# Patient Record
Sex: Female | Born: 1955 | Race: White | Hispanic: No | Marital: Married | Smoking: Never smoker
Health system: Southern US, Community
[De-identification: ages and names within clinical notes are randomized; demographics above are authoritative.]

## PROBLEM LIST (undated history)

## (undated) DIAGNOSIS — N390 Urinary tract infection, site not specified: Secondary | ICD-10-CM

## (undated) DIAGNOSIS — C801 Malignant (primary) neoplasm, unspecified: Secondary | ICD-10-CM

## (undated) DIAGNOSIS — C569 Malignant neoplasm of unspecified ovary: Secondary | ICD-10-CM

## (undated) DIAGNOSIS — R569 Unspecified convulsions: Secondary | ICD-10-CM

## (undated) HISTORY — PX: NO PAST SURGERIES: SHX2092

---

## 2017-08-26 ENCOUNTER — Other Ambulatory Visit: Payer: Self-pay | Admitting: Physician Assistant

## 2017-08-26 ENCOUNTER — Ambulatory Visit
Admission: RE | Admit: 2017-08-26 | Discharge: 2017-08-26 | Disposition: A | Discharge status: Home / Self Care | Payer: BLUE CROSS/BLUE SHIELD | Source: Ambulatory Visit | Attending: Physician Assistant | Admitting: Physician Assistant

## 2017-08-26 DIAGNOSIS — R112 Nausea with vomiting, unspecified: Secondary | ICD-10-CM

## 2017-08-26 DIAGNOSIS — R197 Diarrhea, unspecified: Secondary | ICD-10-CM

## 2017-08-30 ENCOUNTER — Ambulatory Visit
Admission: RE | Admit: 2017-08-30 | Discharge: 2017-08-30 | Disposition: A | Discharge status: Home / Self Care | Payer: BLUE CROSS/BLUE SHIELD | Source: Ambulatory Visit | Attending: Physician Assistant | Admitting: Physician Assistant

## 2017-08-30 ENCOUNTER — Other Ambulatory Visit: Payer: Self-pay | Admitting: Physician Assistant

## 2017-08-30 DIAGNOSIS — J984 Other disorders of lung: Secondary | ICD-10-CM

## 2017-09-03 ENCOUNTER — Observation Stay (HOSPITAL_COMMUNITY)
Admission: EM | Admit: 2017-09-03 | Discharge: 2017-09-05 | Disposition: A | Discharge status: Home / Self Care | Payer: BLUE CROSS/BLUE SHIELD | Attending: Internal Medicine | Admitting: Internal Medicine

## 2017-09-03 ENCOUNTER — Emergency Department (HOSPITAL_COMMUNITY): Payer: BLUE CROSS/BLUE SHIELD

## 2017-09-03 ENCOUNTER — Other Ambulatory Visit: Payer: Self-pay

## 2017-09-03 ENCOUNTER — Encounter (HOSPITAL_COMMUNITY): Payer: Self-pay | Admitting: Emergency Medicine

## 2017-09-03 ENCOUNTER — Observation Stay (HOSPITAL_COMMUNITY): Payer: BLUE CROSS/BLUE SHIELD

## 2017-09-03 DIAGNOSIS — C800 Disseminated malignant neoplasm, unspecified: Secondary | ICD-10-CM | POA: Insufficient documentation

## 2017-09-03 DIAGNOSIS — R748 Abnormal levels of other serum enzymes: Secondary | ICD-10-CM | POA: Diagnosis not present

## 2017-09-03 DIAGNOSIS — R197 Diarrhea, unspecified: Secondary | ICD-10-CM | POA: Insufficient documentation

## 2017-09-03 DIAGNOSIS — R112 Nausea with vomiting, unspecified: Secondary | ICD-10-CM | POA: Diagnosis not present

## 2017-09-03 DIAGNOSIS — J90 Pleural effusion, not elsewhere classified: Secondary | ICD-10-CM | POA: Diagnosis not present

## 2017-09-03 DIAGNOSIS — N838 Other noninflammatory disorders of ovary, fallopian tube and broad ligament: Secondary | ICD-10-CM | POA: Diagnosis present

## 2017-09-03 DIAGNOSIS — K859 Acute pancreatitis without necrosis or infection, unspecified: Secondary | ICD-10-CM

## 2017-09-03 DIAGNOSIS — Z79899 Other long term (current) drug therapy: Secondary | ICD-10-CM | POA: Diagnosis not present

## 2017-09-03 DIAGNOSIS — N1330 Unspecified hydronephrosis: Secondary | ICD-10-CM | POA: Insufficient documentation

## 2017-09-03 DIAGNOSIS — N135 Crossing vessel and stricture of ureter without hydronephrosis: Secondary | ICD-10-CM | POA: Diagnosis not present

## 2017-09-03 DIAGNOSIS — R188 Other ascites: Principal | ICD-10-CM | POA: Diagnosis present

## 2017-09-03 DIAGNOSIS — R1013 Epigastric pain: Secondary | ICD-10-CM | POA: Diagnosis not present

## 2017-09-03 DIAGNOSIS — G40909 Epilepsy, unspecified, not intractable, without status epilepticus: Secondary | ICD-10-CM | POA: Diagnosis not present

## 2017-09-03 DIAGNOSIS — N3001 Acute cystitis with hematuria: Secondary | ICD-10-CM

## 2017-09-03 HISTORY — DX: Unspecified convulsions: R56.9

## 2017-09-03 HISTORY — DX: Urinary tract infection, site not specified: N39.0

## 2017-09-03 LAB — CBC
HCT: 42.9 % (ref 36.0–46.0)
HEMOGLOBIN: 14.7 g/dL (ref 12.0–15.0)
MCH: 29.5 pg (ref 26.0–34.0)
MCHC: 34.3 g/dL (ref 30.0–36.0)
MCV: 86 fL (ref 78.0–100.0)
PLATELETS: 468 10*3/uL — AB (ref 150–400)
RBC: 4.99 MIL/uL (ref 3.87–5.11)
RDW: 13.4 % (ref 11.5–15.5)
WBC: 12.5 10*3/uL — AB (ref 4.0–10.5)

## 2017-09-03 LAB — COMPREHENSIVE METABOLIC PANEL
ALK PHOS: 124 U/L (ref 38–126)
ALT: 38 U/L (ref 14–54)
ANION GAP: 13 (ref 5–15)
AST: 35 U/L (ref 15–41)
Albumin: 3.6 g/dL (ref 3.5–5.0)
BILIRUBIN TOTAL: 0.7 mg/dL (ref 0.3–1.2)
BUN: 7 mg/dL (ref 6–20)
CALCIUM: 8.7 mg/dL — AB (ref 8.9–10.3)
CO2: 22 mmol/L (ref 22–32)
CREATININE: 0.87 mg/dL (ref 0.44–1.00)
Chloride: 98 mmol/L — ABNORMAL LOW (ref 101–111)
Glucose, Bld: 124 mg/dL — ABNORMAL HIGH (ref 65–99)
Potassium: 3.6 mmol/L (ref 3.5–5.1)
SODIUM: 133 mmol/L — AB (ref 135–145)
TOTAL PROTEIN: 7.4 g/dL (ref 6.5–8.1)

## 2017-09-03 LAB — URINALYSIS, ROUTINE W REFLEX MICROSCOPIC
BILIRUBIN URINE: NEGATIVE
GLUCOSE, UA: NEGATIVE mg/dL
Ketones, ur: 5 mg/dL — AB
NITRITE: NEGATIVE
PH: 5 (ref 5.0–8.0)
Protein, ur: NEGATIVE mg/dL
SPECIFIC GRAVITY, URINE: 1.013 (ref 1.005–1.030)

## 2017-09-03 LAB — I-STAT TROPONIN, ED: TROPONIN I, POC: 0 ng/mL (ref 0.00–0.08)

## 2017-09-03 LAB — PHENYTOIN LEVEL, TOTAL

## 2017-09-03 LAB — LIPASE, BLOOD: Lipase: 54 U/L — ABNORMAL HIGH (ref 11–51)

## 2017-09-03 LAB — I-STAT CG4 LACTIC ACID, ED: Lactic Acid, Venous: 1.18 mmol/L (ref 0.5–1.9)

## 2017-09-03 MED ORDER — ONDANSETRON HCL 4 MG/2ML IJ SOLN
4.0000 mg | Freq: Four times a day (QID) | INTRAMUSCULAR | Status: DC | PRN
  Administered 2017-09-03 – 2017-09-04 (×2): 4 mg via INTRAVENOUS
  Filled 2017-09-03 (×2): qty 2

## 2017-09-03 MED ORDER — IOPAMIDOL (ISOVUE-300) INJECTION 61%
INTRAVENOUS | Status: AC
  Administered 2017-09-03: 100 mL via INTRAVENOUS
  Filled 2017-09-03: qty 100

## 2017-09-03 MED ORDER — MORPHINE SULFATE (PF) 4 MG/ML IV SOLN
4.0000 mg | Freq: Once | INTRAVENOUS | Status: DC
  Filled 2017-09-03: qty 1

## 2017-09-03 MED ORDER — SODIUM CHLORIDE 0.9 % IV BOLUS (SEPSIS)
1000.0000 mL | Freq: Once | INTRAVENOUS | Status: AC
  Administered 2017-09-03: 1000 mL via INTRAVENOUS

## 2017-09-03 MED ORDER — PHENYTOIN SODIUM EXTENDED 100 MG PO CAPS: 100.0000 mg | ORAL_CAPSULE | Freq: Every day | ORAL | Status: DC

## 2017-09-03 MED ORDER — PHENYTOIN 50 MG PO CHEW
100.0000 mg | CHEWABLE_TABLET | Freq: Every day | ORAL | Status: DC
  Filled 2017-09-03: qty 2

## 2017-09-03 MED ORDER — ONDANSETRON HCL 4 MG PO TABS: 4.0000 mg | ORAL_TABLET | Freq: Four times a day (QID) | ORAL | Status: DC | PRN

## 2017-09-03 MED ORDER — ENOXAPARIN SODIUM 40 MG/0.4ML ~~LOC~~ SOLN
40.0000 mg | Freq: Every day | SUBCUTANEOUS | Status: DC
  Administered 2017-09-03 – 2017-09-04 (×2): 40 mg via SUBCUTANEOUS
  Filled 2017-09-03 (×2): qty 0.4

## 2017-09-03 MED ORDER — ONDANSETRON HCL 4 MG/2ML IJ SOLN
4.0000 mg | Freq: Once | INTRAMUSCULAR | Status: AC
  Administered 2017-09-03: 4 mg via INTRAVENOUS
  Filled 2017-09-03 (×2): qty 2

## 2017-09-03 MED ORDER — PHENYTOIN SODIUM EXTENDED 100 MG PO CAPS
100.0000 mg | ORAL_CAPSULE | Freq: Every day | ORAL | Status: DC
  Administered 2017-09-03 – 2017-09-04 (×2): 100 mg via ORAL
  Filled 2017-09-03 (×2): qty 1

## 2017-09-03 NOTE — ED Notes (Signed)
Bed assigned @ 2034 rm [id number]

## 2017-09-03 NOTE — ED Triage Notes (Signed)
Pt verbalizes recent UTI with associated nausea "that got better." Pt then verbalizes awoke with emesis this morning and noted blood with wiping post void.

## 2017-09-03 NOTE — ED Notes (Signed)
Bed: [facility] Expected date:  Expected time:  Means of arrival:  Comments: 

## 2017-09-03 NOTE — H&P (Addendum)
History and Physical    Christie Maynard:[id number] DOB: Jan 17, 1956 DOA: 09/03/2017  Referring MD/NP/PA: EDP PCP:  Patient coming from: Home  Chief Complaint: abd bloating  HPI: Christie Maynard is a 61 y.o. female with medical history significant for remote history of seizures in 1982 on 100 mg daily of Dilantin, no other chronic medical problems presents to the emergency room with multiple nonspecific abdominal symptoms for 7-10 days patient reports being in her usual state of health until Thanksgiving time after which she's had nonspecific abdominal discomfort, bloating, 1 or 2 episodes of vomiting and some nausea, She saw her primary care doctor last Thursday who did some urine and stool tests, it is felt that she had a urinary infection and she was started on ciprofloxacin then, she started feeling a little better on antibiotics however in the last few days noticed increased abdominal bloating and nausea, this morning she had sudden onset of nausea and vomiting and she also noticed some pinkish stain on her toilet paper after urinating, although this caused her to be concerned and she presented to the emergency room.  ED Course:  Workup noted mildly elevated white blood cell count and platelets, lipase 54, LFTs and albumin within normal limits, bilirubin 0.7 , she also had a right upper quadrant ultrasound which noted some sludge in the gallbladder with no evidence of gallbladder wall thickening or biliary biliary dilation, in addition it reported right pleural effusion and moderate ascites and slight nodularity in the liver contour, Concerning for possible hepatocellular disease   Review of Systems: As per HPI otherwise 14 point review of systems negative.   Past Medical History:  Diagnosis Date  . Seizures ([place])   . UTI (urinary tract infection)     Past Surgical History:  Procedure Laterality Date  . NO PAST SURGERIES       reports that  has never smoked. she has never used  smokeless tobacco. She reports that she does not drink alcohol or use drugs.  No Known Allergies  History reviewed. No pertinent family history.   Prior to Admission medications   Medication Sig Start Date End Date Taking? Authorizing Provider  ciprofloxacin (CIPRO) 500 MG tablet Take 500 mg by mouth 2 (two) times daily. 08/26/17 09/05/17 Yes [provider]  phenytoin (DILANTIN) 100 MG ER capsule Take 100 mg by mouth daily. 01/18/17  Yes [provider]    Physical Exam: Vitals:   09/03/17 8119 09/03/17 1203 09/03/17 1400 09/03/17 1523  BP: (!) 160/123 133/85 (!) 139/99 132/79  Pulse: (!) 117 (!) 107 (!) 116 (!) 109  Resp: 18 (!) 21 (!) 22 (!) 21  Temp: 97.9 F (36.6 C)     TempSrc: Oral     SpO2: 100% 98% 98% 98%      Constitutional: NAD, calm, comfortable no distress ,  Vitals:   09/03/17 0955 09/03/17 1203 09/03/17 1400 09/03/17 1523  BP: (!) 160/123 133/85 (!) 139/99 132/79  Pulse: (!) 117 (!) 107 (!) 116 (!) 109  Resp: 18 (!) 21 (!) 22 (!) 21  Temp: 97.9 F (36.6 C)     TempSrc: Oral     SpO2: 100% 98% 98% 98%   Eyes: PERRL, lids and conjunctivae normal ENMT: Mucous membranes are moist,  No icterus Neck: normal, supple, no JVD  Respiratory: decreased breath sounds at the right base, rest clear cardiovascular: Regular rate and rhythm, no murmurs / rubs / gallops Abdomen: soft, obese, distended, nontender, bowel sounds present musculoskeletal:  No joint deformity upper and lower extremities. Ext: trace edema Skin: no rashes, lesions, ulcers.  Neurologic: CN 2-12 grossly intact. Sensation intact, DTR normal. Strength 5/5 in all 4.  Psychiatric: Normal judgment and insight. Alert and oriented x 3. Normal mood.   Labs on Admission: I have personally reviewed following labs and imaging studies  CBC: Recent Labs  Lab 09/03/17 0957  WBC 12.5*  HGB 14.7  HCT 42.9  MCV 86.0  PLT 740*   Basic Metabolic Panel: Recent Labs  Lab 09/03/17 0957    NA 133*  K 3.6  CL 98*  CO2 22  GLUCOSE 124*  BUN 7  CREATININE 0.87  CALCIUM 8.7*   GFR: CrCl cannot be calculated (Unknown ideal weight.). Liver Function Tests: Recent Labs  Lab 09/03/17 0957  AST 35  ALT 38  ALKPHOS 124  BILITOT 0.7  PROT 7.4  ALBUMIN 3.6   Recent Labs  Lab 09/03/17 0957  LIPASE 54*   No results for input(s): AMMONIA in the last 168 hours. Coagulation Profile: No results for input(s): INR, PROTIME in the last 168 hours. Cardiac Enzymes: No results for input(s): CKTOTAL, CKMB, CKMBINDEX, TROPONINI in the last 168 hours. BNP (last 3 results) No results for input(s): PROBNP in the last 8760 hours. HbA1C: No results for input(s): HGBA1C in the last 72 hours. CBG: No results for input(s): GLUCAP in the last 168 hours. Lipid Profile: No results for input(s): CHOL, HDL, LDLCALC, TRIG, CHOLHDL, LDLDIRECT in the last 72 hours. Thyroid Function Tests: No results for input(s): TSH, T4TOTAL, FREET4, T3FREE, THYROIDAB in the last 72 hours. Anemia Panel: No results for input(s): VITAMINB12, FOLATE, FERRITIN, TIBC, IRON, RETICCTPCT in the last 72 hours. Urine analysis:    Component Value Date/Time   COLORURINE YELLOW 09/03/2017 1103   APPEARANCEUR HAZY (A) 09/03/2017 1103   LABSPEC 1.013 09/03/2017 1103   PHURINE 5.0 09/03/2017 1103   GLUCOSEU NEGATIVE 09/03/2017 1103   HGBUR MODERATE (A) 09/03/2017 1103   BILIRUBINUR NEGATIVE 09/03/2017 1103   KETONESUR 5 (A) 09/03/2017 1103   PROTEINUR NEGATIVE 09/03/2017 1103   NITRITE NEGATIVE 09/03/2017 1103   LEUKOCYTESUR MODERATE (A) 09/03/2017 1103   Sepsis Labs: @LABRCNTIP (procalcitonin:4,lacticidven:4) )No results found for this or any previous visit (from the past 240 hour(s)).   Radiological Exams on Admission: US Abdomen Limited Ruq  Result Date: 09/03/2017 CLINICAL DATA:  Pancreatitis EXAM: ULTRASOUND ABDOMEN LIMITED RIGHT UPPER QUADRANT COMPARISON:  None. FINDINGS: Gallbladder: Sludge in the  gallbladder. Normal wall thickness. Negative sonographic Murphy. Common bile duct: Diameter: 2.5 mm Liver: Parenchymal echogenicity within normal limits. On some of the images, nodular liver contour is suggested. Portal vein is patent on color Doppler imaging with normal direction of blood flow towards the liver. The right pleural effusion is incidentally noted. There is moderate ascites in the right upper quadrant. IMPRESSION: 1. Sludge in the gallbladder. Negative for wall thickening, tenderness, or biliary dilatation 2. Right pleural effusion and moderate ascites in the right upper quadrant 3. On some of the images, liver contour appears slightly nodular, query hepatocellular disease. Electronically Signed   By: Donavan Foil M.D.   On: 09/03/2017 14:56    EKG: Independently reviewed. Sinus tachycardia, nonspecific T-wave changes   Assessment/Plan  New onset ascites -Etiology is unclear at this time ultrasound does question some nodularity in the liver contour, raising possibility for some underlying liver disease or cirrhosis, check PT/INR, normal albumin level somewhat argues against chronic liver disease -No history of alcoholism, or chronic hepatitis -Urinalysis  without proteinuria -Will check CT abdomen pelvis to evaluate for intra-abdominal malignancy as well as evaluate liver echogenicity -get US guided paracentesis and evaluate cell count, differential, protein, cytology, evaluate SAAG to determine if she has portal hypertension -Check hepatitis C antibody and hep B panel -Check AMA and anti-mitochondrial antibody -Check ferritin and ceruloplasmin levels -monitor Cmet  Remote h/o seizures -continue low dose dilantin per home regimen -check levels  DVT prophylaxis: lovenox Code Status: Full COde  Family Communication: spouse at bedside Disposition Plan: inpatient Consults called: none  Admission status:observation   Domenic Polite MD [facility] Pager [phone]  If 7PM-7AM, please contact night-coverage [email or web address] Password [email or web address]  09/03/2017, 5:02 PM

## 2017-09-03 NOTE — ED Notes (Addendum)
Patient transported to US 

## 2017-09-03 NOTE — ED Notes (Addendum)
Dr. Broadus John at bedside speaking with patient.

## 2017-09-03 NOTE — ED Provider Notes (Signed)
[facility] DEPT Provider Note   CSN: [id number] Arrival date & time: 09/03/17  0944     History   Chief Complaint Chief Complaint  Patient presents with  . Urinary Tract Infection  . Emesis    HPI Christie Maynard is a 61 y.o. female with a history of UTIs who presents the ED for epigastric pain with associated N/V.  She was seen by primary care doctor on 12/13 where she was diagnosed with a UTI and given ciprofloxacin.  Patient initially presented with 3-day history of epigastric abdominal pain, diarrhea, nausea and dysuria.  She was diagnosed with UTI.  At that time the patient was noted to be tachycardic and had a temperature of 100.  Did have x-rays of the chest and abdomen that showed small to moderate hiatal hernia.  There is also evidence of possible pneumonia.  Patient states that her last several days she has been getting better, however woke up this morning and had onset of her symptoms again.  She notes that she has been having constant epigastric pain without radiation.  She notes that she had crackers and water this morning but immediately had bilious, nonbloody emesis after.  She reports resolution of her urinary symptoms and denies any urinary frequency, urinary urgency.  Urine culture was sent and grew E. coli that was sensitive to the ciprofloxacin.  She is now finished this.  She does note that this morning when she wiped she noticed some blood on the tissue paper.  This is now resolved.  Last bowel movement was this morning and normal.  No previous abdominal surgeries.  HPI  Past Medical History:  Diagnosis Date  . Seizures ([place])   . UTI (urinary tract infection)     There are no active problems to display for this patient.   History reviewed. No pertinent surgical history.  OB History    No data available       Home Medications    Prior to Admission medications   Medication Sig Start Date End Date Taking? Authorizing  Provider  ciprofloxacin (CIPRO) 500 MG tablet Take 500 mg by mouth 2 (two) times daily. 08/26/17 09/05/17 Yes [provider]  phenytoin (DILANTIN) 100 MG ER capsule Take 100 mg by mouth daily. 01/18/17  Yes [provider]    Family History No family history on file.  Social History Social History   Tobacco Use  . Smoking status: Never Smoker  Substance Use Topics  . Alcohol use: No    Frequency: Never  . Drug use: Not on file     Allergies   Patient has no known allergies.   Review of Systems Review of Systems  All other systems reviewed and are negative.    Physical Exam Updated Vital Signs BP 132/79 (BP Location: Right Arm)   Pulse (!) 109   Temp 97.9 F (36.6 C) (Oral)   Resp (!) 21   SpO2 98%   Physical Exam  Constitutional: She appears well-developed and well-nourished.  HENT:  Head: Normocephalic and atraumatic.  Right Ear: External ear normal.  Left Ear: External ear normal.  Nose: Nose normal.  Mouth/Throat: Uvula is midline, oropharynx is clear and moist and mucous membranes are normal. No tonsillar exudate.  Eyes: Pupils are equal, round, and reactive to light. Right eye exhibits no discharge. Left eye exhibits no discharge. No scleral icterus.  Neck: Trachea normal. Neck supple. No spinous process tenderness present. No neck rigidity. Normal range of motion  present.  Cardiovascular: Normal rate, regular rhythm and intact distal pulses.  No murmur heard. Pulses:      Radial pulses are 2+ on the right side, and 2+ on the left side.       Dorsalis pedis pulses are 2+ on the right side, and 2+ on the left side.       Posterior tibial pulses are 2+ on the right side, and 2+ on the left side.  No lower extremity swelling or edema. Calves symmetric in size bilaterally.  Pulmonary/Chest: Effort normal and breath sounds normal. She exhibits no tenderness.  Abdominal: Soft. Bowel sounds are normal. She exhibits distension. There is  tenderness in the epigastric area. There is no rigidity, no rebound, no guarding and no CVA tenderness.  Musculoskeletal: She exhibits no edema.  Lymphadenopathy:    She has no cervical adenopathy.  Neurological: She is alert.  Skin: Skin is warm and dry. No rash noted. She is not diaphoretic.  Psychiatric: She has a normal mood and affect.  Nursing note and vitals reviewed.    ED Treatments / Results  Labs (all labs ordered are listed, but only abnormal results are displayed) Labs Reviewed  CBC - Abnormal; Notable for the following components:      Result Value   WBC 12.5 (*)    Platelets 468 (*)    All other components within normal limits  URINALYSIS, ROUTINE W REFLEX MICROSCOPIC - Abnormal; Notable for the following components:   APPearance HAZY (*)    Hgb urine dipstick MODERATE (*)    Ketones, ur 5 (*)    Leukocytes, UA MODERATE (*)    Bacteria, UA RARE (*)    Squamous Epithelial / LPF 6-30 (*)    Non Squamous Epithelial 6-30 (*)    All other components within normal limits  LIPASE, BLOOD  COMPREHENSIVE METABOLIC PANEL    EKG  EKG Interpretation  Date/Time:  Friday September 03 2017 12:04:49 EST Ventricular Rate:  104 PR Interval:    QRS Duration: 84 QT Interval:  347 QTC Calculation: 457 R Axis:   29 Text Interpretation:  Sinus tachycardia Low voltage, precordial leads Borderline T abnormalities, diffuse leads No previous tracing Confirmed by Blanchie Dessert [phone]) on 09/03/2017 12:59:00 PM       Radiology No results found.  Procedures Procedures (including critical care time)  Medications Ordered in ED Medications  morphine 4 MG/ML injection 4 mg (4 mg Intravenous Refused 09/03/17 1214)  sodium chloride 0.9 % bolus 1,000 mL (0 mLs Intravenous Stopped 09/03/17 1522)  ondansetron (ZOFRAN) injection 4 mg (4 mg Intravenous Given 09/03/17 1522)     Initial Impression / Assessment and Plan / ED Course  I have reviewed the triage vital signs and the  nursing notes.  Pertinent labs & imaging results that were available during my care of the patient were reviewed by me and considered in my medical decision making (see chart for details).     61 year old female presenting to the ED for epigastric pain with associated nausea/vomiting.  Patient was seen by primary care on 12/13 and diagnosed with a UTI.  She still has 5 days remaining of her antibiotics.  She is currently on ciprofloxacin that would also cover if this is pyelonephritis.  On exam the patient has epigastric abdominal pain without radiation.  Bowel sounds are heard in all 4 quadrants.  There is no peritoneal signs.  Will obtain urinalysis, abdominal labs, ECG and troponin.  Will also obtain right upper quadrant ultrasound  to evaluate for pain.  ECG sinus tachycardia.  Troponin within normal limits.  Do not suspect ACS.  Lactic acid within normal limits.  Do not suspect sepsis.  She does have mild leukocytosis of 12.5. She is noted to have elevated plt count of 469. Normal albumin. CMP without acute kidney injury.  LFTs within normal limits.  Lipase noted to be elevated at 54.  Patient denies any history of hyperlipidemia, gallstones, or alcohol use.  Right upper quadrant ultrasound shows sludge in the gallbladder.  There is no wall thickening, tenderness or biliary dilation.  There is noted to be moderate ascites in the right upper quadrant and suspected hepatocellular disease. There is also a right pleural effusion. Will consult GI to see how to advance in care.   Spoke with Dr. Carlean Purl who recommended the next step in workup would be CT scan. He does not feel this is the liver as the patient. Advised could also add PT-INR to further confirm. Will discuss with hospitalist for admission for workup of new ascites.  Dr. Broadus John to admit the patient. Family in agreement. She appears safe for admission.   Final Clinical Impressions(s) / ED Diagnoses   Final diagnoses:  Other ascites    Epigastric pain  Acute cystitis with hematuria  Elevated lipase    ED Discharge Orders    None       Lorelle [place] 09/03/17 1639    Blanchie Dessert, MD 09/05/17 [phone]

## 2017-09-04 ENCOUNTER — Observation Stay (HOSPITAL_COMMUNITY): Payer: BLUE CROSS/BLUE SHIELD

## 2017-09-04 DIAGNOSIS — N839 Noninflammatory disorder of ovary, fallopian tube and broad ligament, unspecified: Secondary | ICD-10-CM | POA: Diagnosis not present

## 2017-09-04 DIAGNOSIS — R188 Other ascites: Secondary | ICD-10-CM | POA: Diagnosis not present

## 2017-09-04 DIAGNOSIS — K668 Other specified disorders of peritoneum: Secondary | ICD-10-CM | POA: Diagnosis not present

## 2017-09-04 DIAGNOSIS — N1330 Unspecified hydronephrosis: Secondary | ICD-10-CM | POA: Diagnosis not present

## 2017-09-04 LAB — COMPREHENSIVE METABOLIC PANEL
ALT: 26 U/L (ref 14–54)
AST: 24 U/L (ref 15–41)
Albumin: 3 g/dL — ABNORMAL LOW (ref 3.5–5.0)
Alkaline Phosphatase: 98 U/L (ref 38–126)
Anion gap: 8 (ref 5–15)
BUN: 7 mg/dL (ref 6–20)
CHLORIDE: 100 mmol/L — AB (ref 101–111)
CO2: 24 mmol/L (ref 22–32)
CREATININE: 0.83 mg/dL (ref 0.44–1.00)
Calcium: 8.6 mg/dL — ABNORMAL LOW (ref 8.9–10.3)
Glucose, Bld: 118 mg/dL — ABNORMAL HIGH (ref 65–99)
POTASSIUM: 3.6 mmol/L (ref 3.5–5.1)
SODIUM: 132 mmol/L — AB (ref 135–145)
Total Bilirubin: 0.9 mg/dL (ref 0.3–1.2)
Total Protein: 6.3 g/dL — ABNORMAL LOW (ref 6.5–8.1)

## 2017-09-04 LAB — BODY FLUID CELL COUNT WITH DIFFERENTIAL
Eos, Fluid: 1 %
LYMPHS FL: 60 %
MONOCYTE-MACROPHAGE-SEROUS FLUID: 26 % — AB (ref 50–90)
NEUTROPHIL FLUID: 13 % (ref 0–25)
Total Nucleated Cell Count, Fluid: 538 cu mm (ref 0–1000)

## 2017-09-04 LAB — GLUCOSE, PLEURAL OR PERITONEAL FLUID: Glucose, Fluid: 109 mg/dL

## 2017-09-04 LAB — LACTATE DEHYDROGENASE, PLEURAL OR PERITONEAL FLUID: LD FL: 134 U/L — AB (ref 3–23)

## 2017-09-04 LAB — HIV ANTIBODY (ROUTINE TESTING W REFLEX): HIV SCREEN 4TH GENERATION: NONREACTIVE

## 2017-09-04 LAB — CBC
HEMATOCRIT: 40.5 % (ref 36.0–46.0)
HEMOGLOBIN: 13.2 g/dL (ref 12.0–15.0)
MCH: 28.3 pg (ref 26.0–34.0)
MCHC: 32.6 g/dL (ref 30.0–36.0)
MCV: 86.9 fL (ref 78.0–100.0)
Platelets: 434 10*3/uL — ABNORMAL HIGH (ref 150–400)
RBC: 4.66 MIL/uL (ref 3.87–5.11)
RDW: 13.8 % (ref 11.5–15.5)
WBC: 8.5 10*3/uL (ref 4.0–10.5)

## 2017-09-04 LAB — URINE CULTURE: Culture: NO GROWTH

## 2017-09-04 LAB — ALBUMIN, PLEURAL OR PERITONEAL FLUID: Albumin, Fluid: 2.7 g/dL

## 2017-09-04 LAB — PROTEIN, PLEURAL OR PERITONEAL FLUID: Total protein, fluid: 4.6 g/dL

## 2017-09-04 LAB — PROTIME-INR
INR: 1.15
PROTHROMBIN TIME: 14.6 s (ref 11.4–15.2)

## 2017-09-04 LAB — GRAM STAIN

## 2017-09-04 NOTE — Progress Notes (Signed)
Patient ID: Christie Maynard, female   DOB: 23-Aug-1956, 61 y.o.   MRN: [id number]  PROGRESS NOTE    CARLEAN CROWL  ALP:[id number] DOB: 10-Jun-1956 DOA: 09/03/2017 PCP: Aletha Halim., PA-C   Brief Narrative:  61 year old female with remote history of seizures on oral Dilantin presented with worsening abdominal discomfort, bloating with nausea and vomiting.  She was found to have moderate ascites and slight nodularity in the liver contour on ultrasound of abdomen.  CT scan of abdomen was ordered.  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  We will follow-up recommendations. -Paracentesis pending.  Follow pathology -Pain management.  Advance diet as tolerated  Left-sided hydronephrosis and hydroureter -Most likely secondary to obstruction from the ovarian mass.  Urology consulted  Remote history of seizures -Continue Dilantin  DVT prophylaxis: Lovenox Code Status: Full Family Communication: Spoke to husband and son at bedside Disposition Plan: Home in 1-2 days  Consultants: Gyn oncology and Urology  Procedures: None  Antimicrobials: None   Subjective: Patient seen and examined at bedside.  She is still intermittently nauseous.  Complains of abdominal pressure.  Objective: Vitals:   09/04/17 1101 09/04/17 1109 09/04/17 1123 09/04/17 1137  BP: (!) 149/87 (!) 152/88 135/80 134/79  Pulse:      Resp:      Temp:      TempSrc:      SpO2:      Weight:      Height:        Intake/Output Summary (Last 24 hours) at 09/04/2017 1403 Last data filed at 09/03/2017 1522 Gross per 24 hour  Intake 1000 ml  Output -  Net 1000 ml   Filed Weights   09/03/17 2122  Weight: 70.4 kg (155 lb 3.3 oz)    Examination:  General exam: Appears calm and comfortable  Respiratory system: Bilateral decreased  breath sound at bases Cardiovascular system: S1 & S2 heard, rate controlled  Gastrointestinal system: Abdomen is distended, soft and mildly tender in the periumbilical region. Normal bowel sounds heard. Extremities: No cyanosis, clubbing, edema   Data Reviewed: I have personally reviewed following labs and imaging studies  CBC: Recent Labs  Lab 09/03/17 0957 09/04/17 0534  WBC 12.5* 8.5  HGB 14.7 13.2  HCT 42.9 40.5  MCV 86.0 86.9  PLT 468* 353*   Basic Metabolic Panel: Recent Labs  Lab 09/03/17 0957 09/04/17 0534  NA 133* 132*  K 3.6 3.6  CL 98* 100*  CO2 22 24  GLUCOSE 124* 118*  BUN 7 7  CREATININE 0.87 0.83  CALCIUM 8.7* 8.6*   GFR: Estimated Creatinine Clearance: 63.7 mL/min (by C-G formula based on SCr of 0.83 mg/dL). Liver Function Tests: Recent Labs  Lab 09/03/17 0957 09/04/17 0534  AST 35 24  ALT 38 26  ALKPHOS 124 98  BILITOT 0.7 0.9  PROT 7.4 6.3*  ALBUMIN 3.6 3.0*   Recent Labs  Lab 09/03/17 0957  LIPASE 54*   No results for input(s): AMMONIA in the last 168 hours. Coagulation Profile: Recent Labs  Lab 09/04/17 0534  INR 1.15   Cardiac Enzymes: No results for input(s): CKTOTAL, CKMB, CKMBINDEX, TROPONINI in the last 168 hours. BNP (last 3 results) No results for input(s): PROBNP in the last 8760 hours. HbA1C: No results for input(s): HGBA1C in the last 72 hours. CBG: No  results for input(s): GLUCAP in the last 168 hours. Lipid Profile: No results for input(s): CHOL, HDL, LDLCALC, TRIG, CHOLHDL, LDLDIRECT in the last 72 hours. Thyroid Function Tests: No results for input(s): TSH, T4TOTAL, FREET4, T3FREE, THYROIDAB in the last 72 hours. Anemia Panel: No results for input(s): VITAMINB12, FOLATE, FERRITIN, TIBC, IRON, RETICCTPCT in the last 72 hours. Sepsis Labs: Recent Labs  Lab 09/03/17 1219  LATICACIDVEN 1.18    No results found for this or any previous visit (from the past 240 hour(s)).       Radiology Studies: Ct  Abdomen Pelvis W Contrast  Result Date: 09/03/2017 CLINICAL DATA:  New onset ascites, epigastric pain, nausea, and vomiting for 3 days, abdominal distension swelling, recent UTI EXAM: CT ABDOMEN AND PELVIS WITH CONTRAST TECHNIQUE: Multidetector CT imaging of the abdomen and pelvis was performed using the standard protocol following bolus administration of intravenous contrast. Sagittal and coronal MPR images reconstructed from axial data set. CONTRAST:  100 ML ISOVUE-300 IOPAMIDOL (ISOVUE-300) INJECTION 61% IV. No oral contrast given. COMPARISON:  None FINDINGS: Lower chest: Tiny BILATERAL pleural effusions larger on RIGHT. Subsegmental atelectasis at LEFT base. Hepatobiliary: Dependent density within gallbladder question calculi or sludge. Liver normal appearance. No biliary dilatation. Pancreas: Normal appearance Spleen: Normal appearance Adrenals/Urinary Tract: RIGHT adrenal mass 22 x 19 mm demonstrating significant washout on delayed images consistent with adenoma. LEFT adrenal gland unremarkable. LEFT hydronephrosis and hydroureter present. No RIGHT-side urinary tract dilatation. LEFT ureteral dilatation terminates at a mass in the pelvis see below. Inhomogeneous LEFT nephrogram question pyelonephritis. No renal mass lesions. Bladder decompressed. Stomach/Bowel: Appendix not localized. Colon decompressed though displaced medially especially RIGHT by ascites. Moderate to large hiatal hernia. Decompressed proximal and minimally dilated distal small bowel loops suggesting mild degree of small bowel obstruction. Transition zone appears to be within the RIGHT pelvis at the level of the distal ileum. No definite bowel wall thickening Vascular/Lymphatic: Aorta normal caliber.  No definite adenopathy. Reproductive: Irregular heterogeneous nodular appearing soft tissue masses in the adnexa bilaterally with nodular areas of peripheral irregular enhancement compatible with ovarian neoplasm. It is uncertain whether this  represents a single large multilobulated mass which extends across the midline in both adnexal regions or contiguous BILATERAL ovarian masses. The larger mass portion to the RIGHT of midline measures 10.6 x 4.9 x 9.8 cm and the smaller component to the LEFT of midline measures 8.2 x 4.9 x 4.8 cm. Tumor masses are contiguous with the uterus. Mildly complicated cystic lesion at the LEFT lateral aspect of the uterus 3.1 x 2.4 cm image 74 could be of uterine or ovarian origin. Other: Large amount of ascites. Fluid in lesser sac. Diffuse omental caking by tumor. No free air. No hernia. Musculoskeletal: No acute osseous findings. Prominent epidural vessels within the lumbar spinal canal. 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 Associated ascites and omental caking consistent with peritoneal carcinomatosis. Suspect mild distal small bowel obstruction. Distal LEFT ureteral obstruction with LEFT hydronephrosis and hydroureter. Patchy LEFT nephrogram most likely representing pyelonephritis recommend correlation with urinalysis. Sludge versus dependent calculi within gallbladder. Large hiatal hernia. Tiny BILATERAL pleural effusions. Findings called to Dr. Broadus John on 09/03/2017 at 1741 hours. Electronically Signed   By: Lavonia Dana M.D.   On: 09/03/2017  17:48   US Paracentesis  Result Date: 09/04/2017 INDICATION: Pelvic mass, carcinomatosis and large volume ascites. EXAM: ULTRASOUND GUIDED PARACENTESIS MEDICATIONS: None. COMPLICATIONS: None immediate. PROCEDURE: Informed written consent was obtained from the patient after a discussion of the risks, benefits and alternatives to treatment. A timeout was performed prior to the initiation of the  procedure. Initial ultrasound was performed to localize ascites. The right lower abdomen was prepped and draped in the usual sterile fashion. 1% lidocaine was used for local anesthesia. Following this, a 6 Fr Safe-T-Centesis catheter was introduced. An ultrasound image was saved for documentation purposes. The paracentesis was performed. The catheter was removed and a dressing was applied. The patient tolerated the procedure well without immediate post procedural complication. FINDINGS: A total of approximately 4.8 L of amber, slightly blood tinged fluid was removed. Samples were sent to the laboratory as requested by the clinical team. IMPRESSION: Successful ultrasound-guided paracentesis yielding 4.8 liters of peritoneal fluid. Electronically Signed   By: Aletta Edouard M.D.   On: 09/04/2017 13:31   US Abdomen Limited Ruq  Result Date: 09/03/2017 CLINICAL DATA:  Pancreatitis EXAM: ULTRASOUND ABDOMEN LIMITED RIGHT UPPER QUADRANT COMPARISON:  None. FINDINGS: Gallbladder: Sludge in the gallbladder. Normal wall thickness. Negative sonographic Murphy. Common bile duct: Diameter: 2.5 mm Liver: Parenchymal echogenicity within normal limits. On some of the images, nodular liver contour is suggested. Portal vein is patent on color Doppler imaging with normal direction of blood flow towards the liver. The right pleural effusion is incidentally noted. There is moderate ascites in the right upper quadrant. IMPRESSION: 1. Sludge in the gallbladder. Negative for wall thickening, tenderness, or biliary dilatation 2. Right pleural effusion and moderate ascites in the right upper quadrant 3. On some of the images, liver contour appears slightly nodular, query hepatocellular disease. Electronically Signed   By: Donavan Foil M.D.   On: 09/03/2017 14:56        Scheduled Meds: . enoxaparin (LOVENOX) injection  40 mg Subcutaneous QHS  . phenytoin  100 mg Oral QHS   Continuous Infusions:   LOS: 0 days         Aline August, MD [facility] Pager [phone]  If 7PM-7AM, please contact night-coverage [email or web address] Password [facility] 09/04/2017, 2:03 PM

## 2017-09-04 NOTE — Consult Note (Signed)
Reason for Consult:Omental cake, ascites Referring Physician: [name] is an 61 y.o. female.  HPI: She presented with nausea vomiting, diarrhea and bloating to [facility] several weeks ago.  Concomitant symptoms included early satiety.  She denies any changes in her weight.   Att that point she was treated for a urinary tract infection as well and prescribed antiemetics.  She endorses an episode of vaginal spotting yesterday.  Other imaging included x-rays of the abdomen and chest.  The CXR did not show active pulmonary disease.  She was felt to have pancreatitis.  An abdominal ultrasound yesterday revealed ascites and a right pleural effusion.  CT scan of the abdomen and pelvis showed bilateral adnexal masses, omental cake, large volume ascites and left hydronephrosis.  She has not had a Pap smear in years.  Her last mammogram was in 2015 and was negative.  She has not had any in any screenings for colorectal cancer.  Her family history is remarkable for mother and maternal aunt with breast cancer.  Past Medical History:  Diagnosis Date  . Seizures ([place])   . UTI (urinary tract infection)     Past Surgical History:  Procedure Laterality Date  . NO PAST SURGERIES      History reviewed. No pertinent family history.  Social History:  reports that  has never smoked. she has never used smokeless tobacco. She reports that she does not drink alcohol or use drugs.  Allergies: No Known Allergies  Medications: I have reviewed the patient's current medications.  Results for orders placed or performed during the hospital encounter of 09/03/17 (from the past 48 hour(s))  Lipase, blood     Status: Abnormal   Collection Time: 09/03/17  9:57 AM  Result Value Ref Range   Lipase 54 (H) 11 - 51 U/L  Comprehensive metabolic panel     Status: Abnormal   Collection Time: 09/03/17  9:57 AM  Result Value Ref Range   Sodium 133 (L) 135 - 145 mmol/L   Potassium  3.6 3.5 - 5.1 mmol/L   Chloride 98 (L) 101 - 111 mmol/L   CO2 22 22 - 32 mmol/L   Glucose, Bld 124 (H) 65 - 99 mg/dL   BUN 7 6 - 20 mg/dL   Creatinine, Ser 0.87 0.44 - 1.00 mg/dL   Calcium 8.7 (L) 8.9 - 10.3 mg/dL   Total Protein 7.4 6.5 - 8.1 g/dL   Albumin 3.6 3.5 - 5.0 g/dL   AST 35 15 - 41 U/L   ALT 38 14 - 54 U/L   Alkaline Phosphatase 124 38 - 126 U/L   Total Bilirubin 0.7 0.3 - 1.2 mg/dL   GFR calc non Af Amer >60 >60 mL/min   GFR calc Af Amer >60 >60 mL/min    Comment: (NOTE) The eGFR has been calculated using the CKD EPI equation. This calculation has not been validated in all clinical situations. eGFR's persistently <60 mL/min signify possible Chronic Kidney Disease.    Anion gap 13 5 - 15  CBC     Status: Abnormal   Collection Time: 09/03/17  9:57 AM  Result Value Ref Range   WBC 12.5 (H) 4.0 - 10.5 K/uL   RBC 4.99 3.87 - 5.11 MIL/uL   Hemoglobin 14.7 12.0 - 15.0 g/dL   HCT 42.9 36.0 - 46.0 %   MCV 86.0 78.0 - 100.0 fL   MCH 29.5 26.0 - 34.0 pg   MCHC 34.3 30.0 -  36.0 g/dL   RDW 13.4 11.5 - 15.5 %   Platelets 468 (H) 150 - 400 K/uL  Urinalysis, Routine w reflex microscopic     Status: Abnormal   Collection Time: 09/03/17 11:03 AM  Result Value Ref Range   Color, Urine YELLOW YELLOW   APPearance HAZY (A) CLEAR   Specific Gravity, Urine 1.013 1.005 - 1.030   pH 5.0 5.0 - 8.0   Glucose, UA NEGATIVE NEGATIVE mg/dL   Hgb urine dipstick MODERATE (A) NEGATIVE   Bilirubin Urine NEGATIVE NEGATIVE   Ketones, ur 5 (A) NEGATIVE mg/dL   Protein, ur NEGATIVE NEGATIVE mg/dL   Nitrite NEGATIVE NEGATIVE   Leukocytes, UA MODERATE (A) NEGATIVE   RBC / HPF 6-30 0 - 5 RBC/hpf   WBC, UA 6-30 0 - 5 WBC/hpf   Bacteria, UA RARE (A) NONE SEEN   Squamous Epithelial / LPF 6-30 (A) NONE SEEN   Mucus PRESENT    Non Squamous Epithelial 6-30 (A) NONE SEEN  I-stat troponin, ED     Status: None   Collection Time: 09/03/17 12:17 PM  Result Value Ref Range   Troponin i, poc 0.00 0.00  - 0.08 ng/mL   Comment 3            Comment: Due to the release kinetics of cTnI, a negative result within the first hours of the onset of symptoms does not rule out myocardial infarction with certainty. If myocardial infarction is still suspected, repeat the test at appropriate intervals.   I-Stat CG4 Lactic Acid, ED     Status: None   Collection Time: 09/03/17 12:19 PM  Result Value Ref Range   Lactic Acid, Venous 1.18 0.5 - 1.9 mmol/L  Phenytoin level, total     Status: Abnormal   Collection Time: 09/03/17  3:30 PM  Result Value Ref Range   Phenytoin Lvl <2.5 (L) 10.0 - 20.0 ug/mL  CBC     Status: Abnormal   Collection Time: 09/04/17  5:34 AM  Result Value Ref Range   WBC 8.5 4.0 - 10.5 K/uL   RBC 4.66 3.87 - 5.11 MIL/uL   Hemoglobin 13.2 12.0 - 15.0 g/dL   HCT 40.5 36.0 - 46.0 %   MCV 86.9 78.0 - 100.0 fL   MCH 28.3 26.0 - 34.0 pg   MCHC 32.6 30.0 - 36.0 g/dL   RDW 13.8 11.5 - 15.5 %   Platelets 434 (H) 150 - 400 K/uL  Protime-INR     Status: None   Collection Time: 09/04/17  5:34 AM  Result Value Ref Range   Prothrombin Time 14.6 11.4 - 15.2 seconds   INR 1.15   Comprehensive metabolic panel     Status: Abnormal   Collection Time: 09/04/17  5:34 AM  Result Value Ref Range   Sodium 132 (L) 135 - 145 mmol/L   Potassium 3.6 3.5 - 5.1 mmol/L   Chloride 100 (L) 101 - 111 mmol/L   CO2 24 22 - 32 mmol/L   Glucose, Bld 118 (H) 65 - 99 mg/dL   BUN 7 6 - 20 mg/dL   Creatinine, Ser 0.83 0.44 - 1.00 mg/dL   Calcium 8.6 (L) 8.9 - 10.3 mg/dL   Total Protein 6.3 (L) 6.5 - 8.1 g/dL   Albumin 3.0 (L) 3.5 - 5.0 g/dL   AST 24 15 - 41 U/L   ALT 26 14 - 54 U/L   Alkaline Phosphatase 98 38 - 126 U/L   Total Bilirubin 0.9 0.3 - 1.2  mg/dL   GFR calc non Af Amer >60 >60 mL/min   GFR calc Af Amer >60 >60 mL/min    Comment: (NOTE) The eGFR has been calculated using the CKD EPI equation. This calculation has not been validated in all clinical situations. eGFR's persistently <60  mL/min signify possible Chronic Kidney Disease.    Anion gap 8 5 - 15    Ct Abdomen Pelvis W Contrast  Result Date: 09/03/2017 CLINICAL DATA:  New onset ascites, epigastric pain, nausea, and vomiting for 3 days, abdominal distension swelling, recent UTI EXAM: CT ABDOMEN AND PELVIS WITH CONTRAST TECHNIQUE: Multidetector CT imaging of the abdomen and pelvis was performed using the standard protocol following bolus administration of intravenous contrast. Sagittal and coronal MPR images reconstructed from axial data set. CONTRAST:  100 ML ISOVUE-300 IOPAMIDOL (ISOVUE-300) INJECTION 61% IV. No oral contrast given. COMPARISON:  None FINDINGS: Lower chest: Tiny BILATERAL pleural effusions larger on RIGHT. Subsegmental atelectasis at LEFT base. Hepatobiliary: Dependent density within gallbladder question calculi or sludge. Liver normal appearance. No biliary dilatation. Pancreas: Normal appearance Spleen: Normal appearance Adrenals/Urinary Tract: RIGHT adrenal mass 22 x 19 mm demonstrating significant washout on delayed images consistent with adenoma. LEFT adrenal gland unremarkable. LEFT hydronephrosis and hydroureter present. No RIGHT-side urinary tract dilatation. LEFT ureteral dilatation terminates at a mass in the pelvis see below. Inhomogeneous LEFT nephrogram question pyelonephritis. No renal mass lesions. Bladder decompressed. Stomach/Bowel: Appendix not localized. Colon decompressed though displaced medially especially RIGHT by ascites. Moderate to large hiatal hernia. Decompressed proximal and minimally dilated distal small bowel loops suggesting mild degree of small bowel obstruction. Transition zone appears to be within the RIGHT pelvis at the level of the distal ileum. No definite bowel wall thickening Vascular/Lymphatic: Aorta normal caliber.  No definite adenopathy. Reproductive: Irregular heterogeneous nodular appearing soft tissue masses in the adnexa bilaterally with nodular areas of peripheral  irregular enhancement compatible with ovarian neoplasm. It is uncertain whether this represents a single large multilobulated mass which extends across the midline in both adnexal regions or contiguous BILATERAL ovarian masses. The larger mass portion to the RIGHT of midline measures 10.6 x 4.9 x 9.8 cm and the smaller component to the LEFT of midline measures 8.2 x 4.9 x 4.8 cm. Tumor masses are contiguous with the uterus. Mildly complicated cystic lesion at the LEFT lateral aspect of the uterus 3.1 x 2.4 cm image 74 could be of uterine or ovarian origin. Other: Large amount of ascites. Fluid in lesser sac. Diffuse omental caking by tumor. No free air. No hernia. Musculoskeletal: No acute osseous findings. Prominent epidural vessels within the lumbar spinal canal. 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 Associated ascites and omental caking consistent with peritoneal carcinomatosis. Suspect mild distal small bowel obstruction. Distal LEFT ureteral obstruction with LEFT hydronephrosis and hydroureter. Patchy LEFT nephrogram most likely representing pyelonephritis recommend correlation with urinalysis. Sludge versus dependent calculi within gallbladder. Large hiatal hernia. Tiny BILATERAL pleural effusions. Findings called to Dr. Broadus John on 09/03/2017 at 1741 hours. Electronically Signed   By: Lavonia Dana M.D.   On: 09/03/2017 17:48   US Abdomen Limited Ruq  Result Date: 09/03/2017 CLINICAL DATA:  Pancreatitis EXAM: ULTRASOUND ABDOMEN LIMITED RIGHT UPPER QUADRANT COMPARISON:  None. FINDINGS: Gallbladder: Sludge in the  gallbladder. Normal wall thickness. Negative sonographic Murphy. Common bile duct: Diameter: 2.5 mm Liver: Parenchymal  echogenicity within normal limits. On some of the images, nodular liver contour is suggested. Portal vein is patent on color Doppler imaging with normal direction of blood flow towards the liver. The right pleural effusion is incidentally noted. There is moderate ascites in the right upper quadrant. IMPRESSION: 1. Sludge in the gallbladder. Negative for wall thickening, tenderness, or biliary dilatation 2. Right pleural effusion and moderate ascites in the right upper quadrant 3. On some of the images, liver contour appears slightly nodular, query hepatocellular disease. Electronically Signed   By: Donavan Foil M.D.   On: 09/03/2017 14:56    Review of Systems  Constitutional: Negative for fever and weight loss.  Respiratory: Negative for shortness of breath.   Gastrointestinal: Positive for diarrhea, nausea and vomiting. Negative for abdominal pain.  Genitourinary: Positive for dysuria and frequency.   Blood pressure 136/78, pulse 85, temperature 98.5 F (36.9 C), temperature source Oral, resp. rate 18, height _0  (1.575 m), weight 155 lb 3.3 oz (70.4 kg), SpO2 97 %. Physical Exam  Constitutional: She appears well-developed and well-nourished. No distress.  Neck: Neck supple.  GI: She exhibits distension. There is no tenderness.  Genitourinary: Vaginal discharge: small heme.  Genitourinary Comments: Bimanual exam: cervix normal on palpation; mass filling the pelvis extending to the pelvic sidewalls bilaterally. Rectovaginal exam confirmatory.  Lymphadenopathy:       Right: No inguinal and no supraclavicular adenopathy present.       Left: No inguinal and no supraclavicular adenopathy present.    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

## 2017-09-04 NOTE — Consult Note (Signed)
Urology Consult   Physician requesting consult: Dr. Starla Link  Reason for consult: Left hydronephrosis  History of Present Illness: Christie Maynard is a 61 y.o. who presented to the hospital with abdominal pain and bloating.  CT imaging revealed a large 10 x 10 cm heterogeneous abominal/pelvic mass likely of ovarian origin concerning for ovarian malignancy.  Incidentally, she was also noted to have left hydronephrosis with a delayed left nephrogram consistent with ureteral obstruction.  She denies flank pain, fever, hematuria.  She is s/p paracentesis earlier today with > 4 L removed.  This has greatly improved Christie discomfort.  She denies a history of voiding or storage urinary symptoms, hematuria, UTIs, STDs, urolithiasis, GU malignancy/trauma/surgery.  Past Medical History:  Diagnosis Date  . Seizures ([place])   . UTI (urinary tract infection)     Past Surgical History:  Procedure Laterality Date  . NO PAST SURGERIES       Current Hospital Medications:  Home meds:  Current Meds  Medication Sig  . ciprofloxacin (CIPRO) 500 MG tablet Take 500 mg by mouth 2 (two) times daily.  . phenytoin (DILANTIN) 100 MG ER capsule Take 100 mg by mouth daily.    Scheduled Meds: . enoxaparin (LOVENOX) injection  40 mg Subcutaneous QHS  . phenytoin  100 mg Oral QHS   Continuous Infusions: PRN Meds:.ondansetron **OR** ondansetron (ZOFRAN) IV  Allergies: No Known Allergies  History reviewed. No pertinent family history.  Social History:  reports that  has never smoked. she has never used smokeless tobacco. She reports that she does not drink alcohol or use drugs.  ROS: A complete review of systems was performed.  All systems are negative except for pertinent findings as noted.  Physical Exam:  Vital signs in last 24 hours: Temp:  [98.5 F (36.9 C)-98.9 F (37.2 C)] 98.5 F (36.9 C) (12/22 0425) Pulse Rate:  [85-116] 85 (12/22 0425) Resp:  [18-28] 18 (12/22 0425) BP: (130-152)/(78-99)  152/88 (12/22 1109) SpO2:  [97 %-99 %] 97 % (12/22 0425) Weight:  [70.4 kg (155 lb 3.3 oz)] 70.4 kg (155 lb 3.3 oz) (12/21 2122) Constitutional:  Alert and oriented, No acute distress Cardiovascular: Regular rate and rhythm, No JVD Respiratory: Normal respiratory effort, Lungs clear bilaterally GI: Abdomen is soft, nontender, nondistended, no abdominal masses GU: No CVA tenderness Lymphatic: No lymphadenopathy Neurologic: Grossly intact, no focal deficits Psychiatric: Normal mood and affect  Laboratory Data:  Recent Labs    09/03/17 0957 09/04/17 0534  WBC 12.5* 8.5  HGB 14.7 13.2  HCT 42.9 40.5  PLT 468* 434*    Recent Labs    09/03/17 0957 09/04/17 0534  NA 133* 132*  K 3.6 3.6  CL 98* 100*  GLUCOSE 124* 118*  BUN 7 7  CALCIUM 8.7* 8.6*  CREATININE 0.87 0.83     Results for orders placed or performed during the hospital encounter of 09/03/17 (from the past 24 hour(s))  I-stat troponin, ED     Status: None   Collection Time: 09/03/17 12:17 PM  Result Value Ref Range   Troponin i, poc 0.00 0.00 - 0.08 ng/mL   Comment 3          I-Stat CG4 Lactic Acid, ED     Status: None   Collection Time: 09/03/17 12:19 PM  Result Value Ref Range   Lactic Acid, Venous 1.18 0.5 - 1.9 mmol/L  Phenytoin level, total     Status: Abnormal   Collection Time: 09/03/17  3:30 PM  Result Value  Ref Range   Phenytoin Lvl <2.5 (L) 10.0 - 20.0 ug/mL  CBC     Status: Abnormal   Collection Time: 09/04/17  5:34 AM  Result Value Ref Range   WBC 8.5 4.0 - 10.5 K/uL   RBC 4.66 3.87 - 5.11 MIL/uL   Hemoglobin 13.2 12.0 - 15.0 g/dL   HCT 40.5 36.0 - 46.0 %   MCV 86.9 78.0 - 100.0 fL   MCH 28.3 26.0 - 34.0 pg   MCHC 32.6 30.0 - 36.0 g/dL   RDW 13.8 11.5 - 15.5 %   Platelets 434 (H) 150 - 400 K/uL  Protime-INR     Status: None   Collection Time: 09/04/17  5:34 AM  Result Value Ref Range   Prothrombin Time 14.6 11.4 - 15.2 seconds   INR 1.15   Comprehensive metabolic panel     Status:  Abnormal   Collection Time: 09/04/17  5:34 AM  Result Value Ref Range   Sodium 132 (L) 135 - 145 mmol/L   Potassium 3.6 3.5 - 5.1 mmol/L   Chloride 100 (L) 101 - 111 mmol/L   CO2 24 22 - 32 mmol/L   Glucose, Bld 118 (H) 65 - 99 mg/dL   BUN 7 6 - 20 mg/dL   Creatinine, Ser 0.83 0.44 - 1.00 mg/dL   Calcium 8.6 (L) 8.9 - 10.3 mg/dL   Total Protein 6.3 (L) 6.5 - 8.1 g/dL   Albumin 3.0 (L) 3.5 - 5.0 g/dL   AST 24 15 - 41 U/L   ALT 26 14 - 54 U/L   Alkaline Phosphatase 98 38 - 126 U/L   Total Bilirubin 0.9 0.3 - 1.2 mg/dL   GFR calc non Af Amer >60 >60 mL/min   GFR calc Af Amer >60 >60 mL/min   Anion gap 8 5 - 15   No results found for this or any previous visit (from the past 240 hour(s)).  Renal Function: Recent Labs    09/03/17 0957 09/04/17 0534  CREATININE 0.87 0.83   Estimated Creatinine Clearance: 63.7 mL/min (by C-G formula based on SCr of 0.83 mg/dL).  Radiologic Imaging: Ct Abdomen Pelvis W Contrast  Result Date: 09/03/2017 CLINICAL DATA:  New onset ascites, epigastric pain, nausea, and vomiting for 3 days, abdominal distension swelling, recent UTI EXAM: CT ABDOMEN AND PELVIS WITH CONTRAST TECHNIQUE: Multidetector CT imaging of the abdomen and pelvis was performed using the standard protocol following bolus administration of intravenous contrast. Sagittal and coronal MPR images reconstructed from axial data set. CONTRAST:  100 ML ISOVUE-300 IOPAMIDOL (ISOVUE-300) INJECTION 61% IV. No oral contrast given. COMPARISON:  None FINDINGS: Lower chest: Tiny BILATERAL pleural effusions larger on RIGHT. Subsegmental atelectasis at LEFT base. Hepatobiliary: Dependent density within gallbladder question calculi or sludge. Liver normal appearance. No biliary dilatation. Pancreas: Normal appearance Spleen: Normal appearance Adrenals/Urinary Tract: RIGHT adrenal mass 22 x 19 mm demonstrating significant washout on delayed images consistent with adenoma. LEFT adrenal gland unremarkable. LEFT  hydronephrosis and hydroureter present. No RIGHT-side urinary tract dilatation. LEFT ureteral dilatation terminates at a mass in the pelvis see below. Inhomogeneous LEFT nephrogram question pyelonephritis. No renal mass lesions. Bladder decompressed. Stomach/Bowel: Appendix not localized. Colon decompressed though displaced medially especially RIGHT by ascites. Moderate to large hiatal hernia. Decompressed proximal and minimally dilated distal small bowel loops suggesting mild degree of small bowel obstruction. Transition zone appears to be within the RIGHT pelvis at the level of the distal ileum. No definite bowel wall thickening Vascular/Lymphatic: Aorta normal caliber.  No definite adenopathy. Reproductive: Irregular heterogeneous nodular appearing soft tissue masses in the adnexa bilaterally with nodular areas of peripheral irregular enhancement compatible with ovarian neoplasm. It is uncertain whether this represents a single large multilobulated mass which extends across the midline in both adnexal regions or contiguous BILATERAL ovarian masses. The larger mass portion to the RIGHT of midline measures 10.6 x 4.9 x 9.8 cm and the smaller component to the LEFT of midline measures 8.2 x 4.9 x 4.8 cm. Tumor masses are contiguous with the uterus. Mildly complicated cystic lesion at the LEFT lateral aspect of the uterus 3.1 x 2.4 cm image 74 could be of uterine or ovarian origin. Other: Large amount of ascites. Fluid in lesser sac. Diffuse omental caking by tumor. No free air. No hernia. Musculoskeletal: No acute osseous findings. Prominent epidural vessels within the lumbar spinal canal. 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 Suspect mild distal small bowel obstruction. Distal LEFT ureteral obstruction with LEFT hydronephrosis and hydroureter. Patchy LEFT nephrogram most likely representing pyelonephritis recommend correlation with urinalysis. Sludge versus dependent calculi within gallbladder. Large hiatal hernia. Tiny BILATERAL pleural effusions. Findings called to Dr. Broadus John on 09/03/2017 at 1741 hours. Electronically Signed   By: Lavonia Dana M.D.   On: 09/03/2017 17:48   US Abdomen Limited Ruq  Result Date: 09/03/2017 CLINICAL DATA:  Pancreatitis EXAM: ULTRASOUND ABDOMEN LIMITED RIGHT UPPER QUADRANT COMPARISON:  None. FINDINGS: Gallbladder: Sludge in the gallbladder. Normal wall thickness. Negative sonographic Murphy. Common bile duct: Diameter: 2.5 mm Liver: Parenchymal echogenicity within normal limits. On some of the images, nodular liver contour is suggested. Portal vein is patent on color Doppler imaging with normal direction of blood flow towards the liver. The right pleural effusion is incidentally noted. There is moderate ascites in the right upper quadrant. IMPRESSION: 1. Sludge in the gallbladder. Negative for wall thickening, tenderness, or biliary dilatation 2. Right pleural effusion and moderate ascites in the right upper quadrant 3. On some of the images, liver contour appears slightly nodular, query hepatocellular disease. Electronically Signed   By: Donavan Foil M.D.   On: 09/03/2017 14:56    I independently reviewed the above imaging studies.  Impression/Recommendation: Left hydronephrosis due to extrinsic obstruction from probable ovarian mass - Discussed the CT findings with Christie Maynard and Christie Maynard today.  Although she has no left flank pain and Christie renal function is normal, she is at risk of developing loss of function of Christie left kidney over time.  In an attempt to preserve renal function for optimizing Christie ability to receive  any appropriate systemic therapy in the future and to aid with intraoperative identification of the left ureter if surgery is indicated, I have recommended cystoscopy and left ureteral stent placement.  I discussed the potential benefits and risks of the procedure, side effects of the proposed treatment, the likelihood of the patient achieving the goals of the procedure, and any potential problems that might occur during the procedure or recuperation.  We have agreed to tentatively plan to proceed although she would like to further discuss with Christie Maynard this afternoon.  I will tentatively schedule Christie for cystoscopy and left ureteral stent placement tomorrow morning and  will order Christie to be NPO after midnight.  She will let me know if she decides against stent placement for some reason.  Amori Cooperman,LES 09/04/2017, 11:31 AM  Pryor Curia. MD   CC: Dr. Starla Link

## 2017-09-05 ENCOUNTER — Encounter (HOSPITAL_COMMUNITY): Payer: Self-pay | Admitting: Registered Nurse

## 2017-09-05 ENCOUNTER — Observation Stay (HOSPITAL_COMMUNITY): Payer: BLUE CROSS/BLUE SHIELD

## 2017-09-05 ENCOUNTER — Encounter (HOSPITAL_COMMUNITY)
Admission: EM | Disposition: A | Discharge status: Home / Self Care | Payer: Self-pay | Source: Home / Self Care | Attending: Emergency Medicine

## 2017-09-05 ENCOUNTER — Observation Stay (HOSPITAL_COMMUNITY): Payer: BLUE CROSS/BLUE SHIELD | Admitting: Registered Nurse

## 2017-09-05 DIAGNOSIS — N838 Other noninflammatory disorders of ovary, fallopian tube and broad ligament: Secondary | ICD-10-CM | POA: Diagnosis present

## 2017-09-05 DIAGNOSIS — N1330 Unspecified hydronephrosis: Secondary | ICD-10-CM | POA: Diagnosis not present

## 2017-09-05 DIAGNOSIS — N839 Noninflammatory disorder of ovary, fallopian tube and broad ligament, unspecified: Secondary | ICD-10-CM | POA: Diagnosis not present

## 2017-09-05 DIAGNOSIS — R188 Other ascites: Secondary | ICD-10-CM | POA: Diagnosis not present

## 2017-09-05 HISTORY — PX: CYSTOSCOPY W/ URETERAL STENT PLACEMENT: SHX1429

## 2017-09-05 LAB — CBC WITH DIFFERENTIAL/PLATELET
Basophils Absolute: 0 10*3/uL (ref 0.0–0.1)
Basophils Relative: 0 %
Eosinophils Absolute: 0.1 10*3/uL (ref 0.0–0.7)
Eosinophils Relative: 1 %
HEMATOCRIT: 40.5 % (ref 36.0–46.0)
HEMOGLOBIN: 13.3 g/dL (ref 12.0–15.0)
LYMPHS ABS: 1.7 10*3/uL (ref 0.7–4.0)
Lymphocytes Relative: 21 %
MCH: 29 pg (ref 26.0–34.0)
MCHC: 32.8 g/dL (ref 30.0–36.0)
MCV: 88.2 fL (ref 78.0–100.0)
MONO ABS: 0.8 10*3/uL (ref 0.1–1.0)
MONOS PCT: 9 %
NEUTROS ABS: 5.6 10*3/uL (ref 1.7–7.7)
NEUTROS PCT: 69 %
Platelets: 400 10*3/uL (ref 150–400)
RBC: 4.59 MIL/uL (ref 3.87–5.11)
RDW: 13.7 % (ref 11.5–15.5)
WBC: 8.2 10*3/uL (ref 4.0–10.5)

## 2017-09-05 LAB — COMPREHENSIVE METABOLIC PANEL
ALK PHOS: 78 U/L (ref 38–126)
ALT: 21 U/L (ref 14–54)
ANION GAP: 7 (ref 5–15)
AST: 22 U/L (ref 15–41)
Albumin: 2.8 g/dL — ABNORMAL LOW (ref 3.5–5.0)
BILIRUBIN TOTAL: 0.6 mg/dL (ref 0.3–1.2)
BUN: 8 mg/dL (ref 6–20)
CALCIUM: 8.5 mg/dL — AB (ref 8.9–10.3)
CO2: 25 mmol/L (ref 22–32)
Chloride: 101 mmol/L (ref 101–111)
Creatinine, Ser: 0.91 mg/dL (ref 0.44–1.00)
GLUCOSE: 86 mg/dL (ref 65–99)
POTASSIUM: 4 mmol/L (ref 3.5–5.1)
Sodium: 133 mmol/L — ABNORMAL LOW (ref 135–145)
TOTAL PROTEIN: 5.7 g/dL — AB (ref 6.5–8.1)

## 2017-09-05 LAB — CERULOPLASMIN: CERULOPLASMIN: 29.3 mg/dL (ref 19.0–39.0)

## 2017-09-05 LAB — CA 125: CANCER ANTIGEN (CA) 125: 234.3 U/mL — AB (ref 0.0–38.1)

## 2017-09-05 LAB — MRSA PCR SCREENING: MRSA BY PCR: NEGATIVE

## 2017-09-05 LAB — PH, BODY FLUID: PH, BODY FLUID: 7.7

## 2017-09-05 LAB — MAGNESIUM: Magnesium: 2.2 mg/dL (ref 1.7–2.4)

## 2017-09-05 SURGERY — CYSTOSCOPY, FLEXIBLE, WITH STENT REPLACEMENT
Anesthesia: General | Site: Ureter | Laterality: Left

## 2017-09-05 MED ORDER — SODIUM CHLORIDE 0.9 % IR SOLN
Status: DC | PRN
  Administered 2017-09-05: 3000 mL

## 2017-09-05 MED ORDER — FENTANYL CITRATE (PF) 100 MCG/2ML IJ SOLN
INTRAMUSCULAR | Status: AC
  Filled 2017-09-05: qty 2

## 2017-09-05 MED ORDER — LIDOCAINE HCL (CARDIAC) 10 MG/ML IV SOLN
INTRAVENOUS | Status: DC | PRN
  Administered 2017-09-05: 50 mg via INTRAVENOUS

## 2017-09-05 MED ORDER — PROPOFOL 10 MG/ML IV BOLUS
INTRAVENOUS | Status: DC | PRN
  Administered 2017-09-05: 150 mg via INTRAVENOUS

## 2017-09-05 MED ORDER — OXYCODONE HCL 5 MG PO TABS: 5.0000 mg | ORAL_TABLET | Freq: Once | ORAL | Status: DC | PRN

## 2017-09-05 MED ORDER — CEFAZOLIN SODIUM-DEXTROSE 2-4 GM/100ML-% IV SOLN
INTRAVENOUS | Status: AC
  Filled 2017-09-05: qty 100

## 2017-09-05 MED ORDER — CEFAZOLIN SODIUM-DEXTROSE 2-4 GM/100ML-% IV SOLN
2.0000 g | Freq: Once | INTRAVENOUS | Status: AC
  Administered 2017-09-05: 2 g via INTRAVENOUS

## 2017-09-05 MED ORDER — LIDOCAINE 2% (20 MG/ML) 5 ML SYRINGE
INTRAMUSCULAR | Status: AC
  Filled 2017-09-05: qty 5

## 2017-09-05 MED ORDER — FENTANYL CITRATE (PF) 250 MCG/5ML IJ SOLN
INTRAMUSCULAR | Status: AC
  Filled 2017-09-05: qty 5

## 2017-09-05 MED ORDER — MIDAZOLAM HCL 5 MG/5ML IJ SOLN
INTRAMUSCULAR | Status: DC | PRN
  Administered 2017-09-05: 2 mg via INTRAVENOUS

## 2017-09-05 MED ORDER — OXYCODONE HCL 5 MG/5ML PO SOLN
5.0000 mg | Freq: Once | ORAL | Status: DC | PRN
  Filled 2017-09-05: qty 5

## 2017-09-05 MED ORDER — FENTANYL CITRATE (PF) 100 MCG/2ML IJ SOLN
INTRAMUSCULAR | Status: DC | PRN
  Administered 2017-09-05 (×2): 50 ug via INTRAVENOUS

## 2017-09-05 MED ORDER — DEXAMETHASONE SODIUM PHOSPHATE 10 MG/ML IJ SOLN
INTRAMUSCULAR | Status: AC
  Filled 2017-09-05: qty 1

## 2017-09-05 MED ORDER — PROPOFOL 10 MG/ML IV BOLUS
INTRAVENOUS | Status: AC
  Filled 2017-09-05: qty 20

## 2017-09-05 MED ORDER — ONDANSETRON HCL 4 MG/2ML IJ SOLN: 4.0000 mg | Freq: Once | INTRAMUSCULAR | Status: DC | PRN

## 2017-09-05 MED ORDER — LACTATED RINGERS IV SOLN
INTRAVENOUS | Status: DC | PRN
  Administered 2017-09-05: 11:00:00 via INTRAVENOUS

## 2017-09-05 MED ORDER — TRAMADOL HCL 50 MG PO TABS: 50.0000 mg | ORAL_TABLET | Freq: Four times a day (QID) | ORAL | 0 refills | Status: DC | PRN

## 2017-09-05 MED ORDER — ONDANSETRON HCL 4 MG/2ML IJ SOLN
INTRAMUSCULAR | Status: DC | PRN
  Administered 2017-09-05: 4 mg via INTRAVENOUS

## 2017-09-05 MED ORDER — ONDANSETRON HCL 4 MG/2ML IJ SOLN
INTRAMUSCULAR | Status: AC
  Filled 2017-09-05: qty 2

## 2017-09-05 MED ORDER — LACTATED RINGERS IV SOLN
INTRAVENOUS | Status: DC
  Administered 2017-09-05: 11:00:00 via INTRAVENOUS

## 2017-09-05 MED ORDER — MIDAZOLAM HCL 2 MG/2ML IJ SOLN
INTRAMUSCULAR | Status: AC
  Filled 2017-09-05: qty 2

## 2017-09-05 MED ORDER — ONDANSETRON HCL 4 MG PO TABS: 4.0000 mg | ORAL_TABLET | Freq: Four times a day (QID) | ORAL | 0 refills | Status: DC | PRN

## 2017-09-05 MED ORDER — FENTANYL CITRATE (PF) 100 MCG/2ML IJ SOLN: 25.0000 ug | INTRAMUSCULAR | Status: DC | PRN

## 2017-09-05 MED ORDER — CEFAZOLIN SODIUM-DEXTROSE 2-3 GM-%(50ML) IV SOLR
INTRAVENOUS | Status: DC | PRN
  Administered 2017-09-05: 2 g via INTRAVENOUS

## 2017-09-05 MED ORDER — DEXAMETHASONE SODIUM PHOSPHATE 10 MG/ML IJ SOLN
INTRAMUSCULAR | Status: DC | PRN
  Administered 2017-09-05: 10 mg via INTRAVENOUS

## 2017-09-05 SURGICAL SUPPLY — 12 items
BAG URO CATCHER STRL LF (MISCELLANEOUS) ×3 IMPLANT
CATH INTERMIT  6FR 70CM (CATHETERS) ×3 IMPLANT
CLOTH BEACON ORANGE TIMEOUT ST (SAFETY) ×3 IMPLANT
COVER FOOTSWITCH UNIV (MISCELLANEOUS) IMPLANT
COVER SURGICAL LIGHT HANDLE (MISCELLANEOUS) ×3 IMPLANT
GLOVE BIOGEL M STRL SZ7.5 (GLOVE) ×3 IMPLANT
GOWN STRL REUS W/TWL LRG LVL3 (GOWN DISPOSABLE) ×6 IMPLANT
GUIDEWIRE STR DUAL SENSOR (WIRE) ×3 IMPLANT
MANIFOLD NEPTUNE II (INSTRUMENTS) ×3 IMPLANT
PACK CYSTO (CUSTOM PROCEDURE TRAY) ×3 IMPLANT
TUBING CONNECTING 10 (TUBING) ×2 IMPLANT
TUBING CONNECTING 10' (TUBING) ×1

## 2017-09-05 NOTE — Anesthesia Postprocedure Evaluation (Signed)
Anesthesia Post Note  Patient: Christie Maynard  Procedure(s) Performed: CYSTOSCOPY WITH LEFT URETERAL STENT REPLACEMENT (Left Ureter)     Patient location during evaluation: PACU Anesthesia Type: General Level of consciousness: awake, awake and alert and oriented Pain management: pain level controlled Vital Signs Assessment: post-procedure vital signs reviewed and stable Respiratory status: spontaneous breathing, nonlabored ventilation and respiratory function stable Cardiovascular status: blood pressure returned to baseline Postop Assessment: no headache Anesthetic complications: no    Last Vitals:  Vitals:   09/05/17 1230 09/05/17 1245  BP: 118/70 113/72  Pulse: 72 70  Resp: 18 20  Temp: 36.9 C 36.9 C  SpO2: 98% 97%    Last Pain:  Vitals:   09/05/17 1245  TempSrc:   PainSc: 0-No pain                 Rion Schnitzer COKER

## 2017-09-05 NOTE — Anesthesia Preprocedure Evaluation (Signed)
Anesthesia Evaluation  Patient identified by MRN, date of birth, ID band Patient awake    Reviewed: Allergy & Precautions, NPO status , Patient's Chart, lab work & pertinent test results  Airway Mallampati: II  TM Distance: >3 FB Neck ROM: Full    Dental  (+) Poor Dentition, Dental Advisory Given   Pulmonary    breath sounds clear to auscultation       Cardiovascular  Rhythm:Regular Rate:Normal     Neuro/Psych    GI/Hepatic   Endo/Other    Renal/GU      Musculoskeletal   Abdominal   Peds  Hematology   Anesthesia Other Findings   Reproductive/Obstetrics                             Anesthesia Physical Anesthesia Plan  ASA: III  Anesthesia Plan: General   Post-op Pain Management:    Induction: Intravenous  PONV Risk Score and Plan: 1 and Ondansetron and Dexamethasone  Airway Management Planned: LMA  Additional Equipment:   Intra-op Plan:   Post-operative Plan:   Informed Consent: I have reviewed the patients History and Physical, chart, labs and discussed the procedure including the risks, benefits and alternatives for the proposed anesthesia with the patient or authorized representative who has indicated his/her understanding and acceptance.   Dental advisory given  Plan Discussed with: CRNA and Anesthesiologist  Anesthesia Plan Comments:         Anesthesia Quick Evaluation

## 2017-09-05 NOTE — Op Note (Signed)
Preoperative diagnosis:  1. Left ureteral obstruction   Postoperative diagnosis:  1. Left ureteral obstruction   Procedure:  1. Cystoscopy 2. Left ureteral stent placement (6 x 24 with no string)  Surgeon: Roxy Horseman, Brooke Bonito. M.D.  Anesthesia: General  Complications: None  EBL: Minimal  Specimens: None  Indication: Christie Maynard is a 61 y.o. 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 After reviewing the management options for treatment, she elected to proceed with the above surgical procedure(s). We have discussed the potential benefits and risks of the procedure, side effects of the proposed treatment, the likelihood of the patient achieving the goals of the procedure, and any potential problems that might occur during the procedure or recuperation. Informed consent has been obtained.  Description of procedure:  The patient was taken to the operating room and general anesthesia was induced.  The patient was placed in the dorsal lithotomy position, prepped and draped in the usual sterile fashion, and preoperative antibiotics were administered. A preoperative time-out was performed.   Cystourethroscopy was performed.  The patient's urethra was examined and was normal aside from extrinsic compress at the dome and posteriorly from her known mass.. The bladder was then systematically examined in its entirety. There was no evidence for any bladder tumors, stones, or other mucosal pathology.    A 0.38 sensor guidewire was then advanced up the left ureter into the renal pelvis under fluoroscopic guidance.  The wire was then backloaded through the cystoscope and a ureteral stent was advance over the wire using Seldinger technique.  The stent was positioned appropriately under fluoroscopic and cystoscopic guidance.  The wire was then removed with an adequate stent curl noted in the renal pelvis as well as in the bladder.  The bladder was then  emptied and the procedure ended.  The patient appeared to tolerate the procedure well and without complications.  The patient was able to be awakened and transferred to the recovery unit in satisfactory condition.    Pryor Curia MD

## 2017-09-05 NOTE — Discharge Summary (Signed)
Physician Discharge Summary  Christie Maynard MVE:[id number] DOB: Dec 06, 1955 DOA: 09/03/2017  PCP: Aletha Halim., PA-C  Admit date: 09/03/2017 Discharge date: 09/05/2017  Admitted From: Home Disposition:  Home  Recommendations for Outpatient Follow-up:  1. Follow up with PCP in 1week 2. Follow-up with oncology/gynecology oncology at earliest convenience 3. Follow-up with urology in 1-2 months   Home Health: No  equipment/Devices: None  Discharge Condition: Stable CODE STATUS: Full Diet recommendation: Heart Healthy /soft diet  Brief/Interim Summary: 61 year old female with remote history of seizures on oral Dilantin presented with worsening abdominal discomfort, bloating with nausea and vomiting.  She was found to have moderate ascites and slight nodularity in the liver contour on ultrasound of abdomen.  CT of the abdomen showed ovarian mass with a suspicion of malignancy along with new onset ascites and peritoneal carcinomatosis along with left-sided hydronephrosis and hydroureter.  Gynecologic oncology and urology were consulted.  Patient underwent paracentesis and removal of 4.8 L ascitic fluid; cytology is pending.  She underwent cystoscopy and stent placement by urology today.  GYN oncology and urology have cleared the patient for discharge.  Patient will be called by gyn oncology at earliest convenience for a follow-up appointment.  Patient will be discharged home today if she tolerates her diet.  Discharge Diagnoses:  Principal Problem:   Other ascites Active Problems:   Ascites   Ovarian mass  Ovarian mass with a suspicion of malignancy along with new onset ascites and peritoneal carcinomatosis -GYN oncology consult appreciated. I spoke to Dr. Glennon Mac more on phone today and she stated that she will arrange for outpatient follow-up with GYN oncology at earliest convenience. -Status post paracentesis and removal of 4.8 L of ascitic fluid.  Follow pathology -Discharge  patient home today if she tolerates her diet  Left-sided hydronephrosis and hydroureter -Status post cystoscopy and stent placement by urology today.  Outpatient follow-up will be arranged by urology.  Remote history of seizures -Continue Dilantin  Discharge Instructions  Discharge Instructions    Ambulatory referral to Gynecologic Oncology   Complete by:  As directed    New diagnosis of ovarian mass   Call MD for:  difficulty breathing, headache or visual disturbances   Complete by:  As directed    Call MD for:  extreme fatigue   Complete by:  As directed    Call MD for:  hives   Complete by:  As directed    Call MD for:  persistant dizziness or light-headedness   Complete by:  As directed    Call MD for:  persistant nausea and vomiting   Complete by:  As directed    Call MD for:  severe uncontrolled pain   Complete by:  As directed    Call MD for:  temperature >100.4   Complete by:  As directed    Diet general   Complete by:  As directed    Increase activity slowly   Complete by:  As directed      Allergies as of 09/05/2017   No Known Allergies     Medication List    STOP taking these medications   ciprofloxacin 500 MG tablet Commonly known as:  CIPRO     TAKE these medications   ondansetron 4 MG tablet Commonly known as:  ZOFRAN Take 1 tablet (4 mg total) by mouth every 6 (six) hours as needed for nausea.   phenytoin 100 MG ER capsule Commonly known as:  DILANTIN Take 100 mg by mouth daily.  traMADol 50 MG tablet Commonly known as:  ULTRAM Take 1 tablet (50 mg total) by mouth every 6 (six) hours as needed for moderate pain.      Follow-up Information    Aletha Halim., PA-C. Schedule an appointment as soon as possible for a visit in 1 week(s).   Specialty:  Family Medicine Contact information: [address] [place] [address] [phone]        Raynelle Bring, MD. Schedule an appointment as soon as possible for a visit in 1  week(s).   Specialty:  Urology Contact information: [place] [address] [phone]        Lahoma Crocker, MD. Schedule an appointment as soon as possible for a visit.   Specialty:  Obstetrics and Gynecology Why:  at earliest convenience. Contact information: [place] [address] [phone]          No Known Allergies  Consultations: Gyn oncology and Urology  Procedures/Studies: Dg Chest 2 View  Result Date: 08/30/2017 CLINICAL DATA:  Follow-up left lung base opacity on recent abdominal radiograph EXAM: CHEST  2 VIEW COMPARISON:  08/26/2017 abdominal radiographs FINDINGS: Normal heart size. Small to moderate hiatal hernia. Otherwise normal mediastinal contour. No pneumothorax. No pleural effusions. No pulmonary edema. Lungs appear clear, with no acute consolidative airspace disease. IMPRESSION: Small to moderate hiatal hernia.  No active pulmonary disease. Electronically Signed   By: Ilona Sorrel M.D.   On: 08/30/2017 17:19   Ct Abdomen Pelvis W Contrast  Result Date: 09/03/2017 CLINICAL DATA:  New onset ascites, epigastric pain, nausea, and vomiting for 3 days, abdominal distension swelling, recent UTI EXAM: CT ABDOMEN AND PELVIS WITH CONTRAST TECHNIQUE: Multidetector CT imaging of the abdomen and pelvis was performed using the standard protocol following bolus administration of intravenous contrast. Sagittal and coronal MPR images reconstructed from axial data set. CONTRAST:  100 ML ISOVUE-300 IOPAMIDOL (ISOVUE-300) INJECTION 61% IV. No oral contrast given. COMPARISON:  None FINDINGS: Lower chest: Tiny BILATERAL pleural effusions larger on RIGHT. Subsegmental atelectasis at LEFT base. Hepatobiliary: Dependent density within gallbladder question calculi or sludge. Liver normal appearance. No biliary dilatation. Pancreas: Normal appearance Spleen: Normal appearance Adrenals/Urinary Tract: RIGHT adrenal mass 22 x 19 mm demonstrating  significant washout on delayed images consistent with adenoma. LEFT adrenal gland unremarkable. LEFT hydronephrosis and hydroureter present. No RIGHT-side urinary tract dilatation. LEFT ureteral dilatation terminates at a mass in the pelvis see below. Inhomogeneous LEFT nephrogram question pyelonephritis. No renal mass lesions. Bladder decompressed. Stomach/Bowel: Appendix not localized. Colon decompressed though displaced medially especially RIGHT by ascites. Moderate to large hiatal hernia. Decompressed proximal and minimally dilated distal small bowel loops suggesting mild degree of small bowel obstruction. Transition zone appears to be within the RIGHT pelvis at the level of the distal ileum. No definite bowel wall thickening Vascular/Lymphatic: Aorta normal caliber.  No definite adenopathy. Reproductive: Irregular heterogeneous nodular appearing soft tissue masses in the adnexa bilaterally with nodular areas of peripheral irregular enhancement compatible with ovarian neoplasm. It is uncertain whether this represents a single large multilobulated mass which extends across the midline in both adnexal regions or contiguous BILATERAL ovarian masses. The larger mass portion to the RIGHT of midline measures 10.6 x 4.9 x 9.8 cm and the smaller component to the LEFT of midline measures 8.2 x 4.9 x 4.8 cm. Tumor masses are contiguous with the uterus. Mildly complicated cystic lesion at the LEFT lateral aspect of the uterus 3.1 x 2.4 cm image 74 could  be of uterine or ovarian origin. Other: Large amount of ascites. Fluid in lesser sac. Diffuse omental caking by tumor. No free air. No hernia. Musculoskeletal: No acute osseous findings. Prominent epidural vessels within the lumbar spinal canal. 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 Associated ascites and omental caking consistent with peritoneal carcinomatosis. Suspect mild distal small bowel obstruction. Distal LEFT ureteral obstruction with LEFT hydronephrosis and hydroureter. Patchy LEFT nephrogram most likely representing pyelonephritis recommend correlation with urinalysis. Sludge versus dependent calculi within gallbladder. Large hiatal hernia. Tiny BILATERAL pleural effusions. Findings called to Dr. Broadus John on 09/03/2017 at 1741 hours. Electronically Signed   By: Lavonia Dana M.D.   On: 09/03/2017 17:48   US Paracentesis  Result Date: 09/04/2017 INDICATION: Pelvic mass, carcinomatosis and large volume ascites. EXAM: ULTRASOUND GUIDED PARACENTESIS MEDICATIONS: None. COMPLICATIONS: None immediate. PROCEDURE: Informed written consent was obtained from the patient after a discussion of the risks, benefits and alternatives to treatment. A timeout was performed prior to the initiation of the procedure. Initial ultrasound was performed to localize ascites. The right lower abdomen was prepped and draped in the usual sterile fashion. 1% lidocaine was used for local anesthesia. Following this, a 6 Fr Safe-T-Centesis catheter was introduced. An ultrasound image was saved for documentation purposes. The paracentesis was performed. The catheter was removed and a dressing was applied. The patient tolerated the procedure well without immediate post procedural complication. FINDINGS: A total of approximately 4.8 L of amber, slightly blood tinged fluid was removed. Samples were sent to the laboratory as requested by the clinical team. IMPRESSION: Successful ultrasound-guided paracentesis yielding 4.8 liters of peritoneal fluid. Electronically Signed   By: Aletta Edouard M.D.   On: 09/04/2017 13:31   Dg Abd 2 Views  Result Date: 08/26/2017 CLINICAL DATA:  Acute onset of nausea, vomiting, diarrhea and  generalized abdominal bloating. EXAM: ABDOMEN - 2 VIEW COMPARISON:  None. FINDINGS: The visualized bowel gas pattern is unremarkable. Scattered air and stool filled loops of colon are seen; no abnormal dilatation of small bowel loops is seen to suggest small bowel obstruction. No free intra-abdominal air is identified on the provided upright view. The visualized osseous structures are within normal limits; the sacroiliac joints are unremarkable in appearance. Minimal haziness at the left lung base may reflect atelectasis or possibly mild infection. IMPRESSION: 1. Unremarkable bowel gas pattern; no free intra-abdominal air seen. Small to moderate amount of stool noted in the colon. 2. Minimal haziness at the left lung base may reflect atelectasis or possibly mild infection. Electronically Signed   By: Garald Balding M.D.   On: 08/26/2017 21:48   Dg C-arm 1-60 Min-no Report  Result Date: 09/05/2017 Fluoroscopy was utilized by the requesting physician.  No radiographic interpretation.   US Abdomen Limited Ruq  Result Date: 09/03/2017 CLINICAL DATA:  Pancreatitis EXAM: ULTRASOUND ABDOMEN LIMITED RIGHT UPPER QUADRANT COMPARISON:  None. FINDINGS: Gallbladder: Sludge in the gallbladder. Normal wall thickness. Negative sonographic Murphy. Common bile duct: Diameter: 2.5 mm Liver: Parenchymal echogenicity within normal limits. On some of the images, nodular liver contour is suggested. Portal vein is patent on color Doppler imaging with normal direction of blood flow towards the liver. The right pleural effusion is incidentally noted. There is moderate ascites in the  right upper quadrant. IMPRESSION: 1. Sludge in the gallbladder. Negative for wall thickening, tenderness, or biliary dilatation 2. Right pleural effusion and moderate ascites in the right upper quadrant 3. On some of the images, liver contour appears slightly nodular, query hepatocellular disease. Electronically Signed   By: Donavan Foil M.D.   On:  09/03/2017 14:56    Paracentesis and removal of 4.8 L of ascitic fluid on 09/04/2017  Cystoscopy and left ureteral stent placement on 09/05/2017  Subjective: Patient seen and examined at bedside.  She denies any current abdominal pain or nausea.  She just came back from her cystoscopy procedure.  No overnight fever or vomiting.  Discharge Exam: Vitals:   09/05/17 1245 09/05/17 1305  BP: 113/72 128/80  Pulse: 70 77  Resp: 20 18  Temp: 98.5 F (36.9 C) 98.5 F (36.9 C)  SpO2: 97% 99%   Vitals:   09/05/17 1215 09/05/17 1230 09/05/17 1245 09/05/17 1305  BP: 103/87 118/70 113/72 128/80  Pulse: 79 72 70 77  Resp: 19 18 20 18   Temp:  98.4 F (36.9 C) 98.5 F (36.9 C) 98.5 F (36.9 C)  TempSrc:      SpO2: 100% 98% 97% 99%  Weight:      Height:        General: Pt is alert, awake, not in acute distress Cardiovascular: Rate controlled, S1/S2 + Respiratory: Bilateral decreased breath sounds at bases Abdominal: Soft, slightly distended, NT, ND, bowel sounds + Extremities: trace edema, no cyanosis    The results of significant diagnostics from this hospitalization (including imaging, microbiology, ancillary and laboratory) are listed below for reference.     Microbiology: Recent Results (from the past 240 hour(s))  Urine culture     Status: None   Collection Time: 09/03/17  3:15 PM  Result Value Ref Range Status   Specimen Description URINE, CLEAN CATCH  Final   Special Requests NONE  Final   Culture   Final    NO GROWTH Performed at [facility], [address] [place], [place] [address]    Report Status 09/04/2017 FINAL  Final  Culture, body fluid-bottle     Status: None (Preliminary result)   Collection Time: 09/04/17 11:44 AM  Result Value Ref Range Status   Specimen Description PARACENTESIS  Final   Special Requests   Final    BOTTLES DRAWN AEROBIC AND ANAEROBIC Performed at [facility], [place] [address] [place], [place] [address]    Culture  PENDING  Incomplete   Report Status PENDING  Incomplete  Gram stain     Status: None   Collection Time: 09/04/17 11:44 AM  Result Value Ref Range Status   Specimen Description PARACENTESIS  Final   Special Requests NONE  Final   Gram Stain   Final    FEW WBC PRESENT, PREDOMINANTLY MONONUCLEAR NO ORGANISMS SEEN Performed at [facility], [address] [place], [place] [address]    Report Status 09/04/2017 FINAL  Final  MRSA PCR Screening     Status: None   Collection Time: 09/05/17  7:01 AM  Result Value Ref Range Status   MRSA by PCR NEGATIVE NEGATIVE Final    Comment:        The GeneXpert MRSA Assay (FDA approved for NASAL specimens only), is one component of a comprehensive MRSA colonization surveillance program. It is not intended to diagnose MRSA infection nor to guide or monitor treatment for MRSA infections.      Labs:  BNP (last 3 results) No results for input(s): BNP in the last 8760 hours. Basic Metabolic Panel: Recent Labs  Lab 09/03/17 0957 09/04/17 0534 09/05/17 0545  NA 133* 132* 133*  K 3.6 3.6 4.0  CL 98* 100* 101  CO2 22 24 25   GLUCOSE 124* 118* 86  BUN 7 7 8   CREATININE 0.87 0.83 0.91  CALCIUM 8.7* 8.6* 8.5*  MG  --   --  2.2   Liver Function Tests: Recent Labs  Lab 09/03/17 0957 09/04/17 0534 09/05/17 0545  AST 35 24 22  ALT 38 26 21  ALKPHOS 124 98 78  BILITOT 0.7 0.9 0.6  PROT 7.4 6.3* 5.7*  ALBUMIN 3.6 3.0* 2.8*   Recent Labs  Lab 09/03/17 0957  LIPASE 54*   No results for input(s): AMMONIA in the last 168 hours. CBC: Recent Labs  Lab 09/03/17 0957 09/04/17 0534 09/05/17 0545  WBC 12.5* 8.5 8.2  NEUTROABS  --   --  5.6  HGB 14.7 13.2 13.3  HCT 42.9 40.5 40.5  MCV 86.0 86.9 88.2  PLT 468* 434* 400   Cardiac Enzymes: No results for input(s): CKTOTAL, CKMB, CKMBINDEX, TROPONINI in the last 168 hours. BNP: Invalid input(s): POCBNP CBG: No results for input(s): GLUCAP in the last 168 hours. D-Dimer No  results for input(s): DDIMER in the last 72 hours. Hgb A1c No results for input(s): HGBA1C in the last 72 hours. Lipid Profile No results for input(s): CHOL, HDL, LDLCALC, TRIG, CHOLHDL, LDLDIRECT in the last 72 hours. Thyroid function studies No results for input(s): TSH, T4TOTAL, T3FREE, THYROIDAB in the last 72 hours.  Invalid input(s): FREET3 Anemia work up No results for input(s): VITAMINB12, FOLATE, FERRITIN, TIBC, IRON, RETICCTPCT in the last 72 hours. Urinalysis    Component Value Date/Time   COLORURINE YELLOW 09/03/2017 1103   APPEARANCEUR HAZY (A) 09/03/2017 1103   LABSPEC 1.013 09/03/2017 1103   PHURINE 5.0 09/03/2017 1103   GLUCOSEU NEGATIVE 09/03/2017 1103   HGBUR MODERATE (A) 09/03/2017 1103   BILIRUBINUR NEGATIVE 09/03/2017 1103   KETONESUR 5 (A) 09/03/2017 1103   PROTEINUR NEGATIVE 09/03/2017 1103   NITRITE NEGATIVE 09/03/2017 1103   LEUKOCYTESUR MODERATE (A) 09/03/2017 1103   Sepsis Labs Invalid input(s): PROCALCITONIN,  WBC,  LACTICIDVEN Microbiology Recent Results (from the past 240 hour(s))  Urine culture     Status: None   Collection Time: 09/03/17  3:15 PM  Result Value Ref Range Status   Specimen Description URINE, CLEAN CATCH  Final   Special Requests NONE  Final   Culture   Final    NO GROWTH Performed at [facility], [place] [address], [place], [place] [address]    Report Status 09/04/2017 FINAL  Final  Culture, body fluid-bottle     Status: None (Preliminary result)   Collection Time: 09/04/17 11:44 AM  Result Value Ref Range Status   Specimen Description PARACENTESIS  Final   Special Requests   Final    BOTTLES DRAWN AEROBIC AND ANAEROBIC Performed at [facility], [place] [address], [place], [place] [address]    Culture PENDING  Incomplete   Report Status PENDING  Incomplete  Gram stain     Status: None   Collection Time: 09/04/17 11:44 AM  Result Value Ref Range Status   Specimen Description PARACENTESIS  Final   Special  Requests NONE  Final   Gram Stain   Final    FEW WBC PRESENT, PREDOMINANTLY MONONUCLEAR NO ORGANISMS SEEN Performed at [facility], [place] [address], [place], [place] [address]    Report Status 09/04/2017 FINAL  Final  MRSA PCR Screening     Status: None   Collection Time: 09/05/17  7:01 AM  Result Value Ref Range Status   MRSA by PCR NEGATIVE NEGATIVE Final    Comment:        The GeneXpert MRSA Assay (FDA approved for NASAL specimens only), is one component of a comprehensive MRSA colonization surveillance program. It is not intended to diagnose MRSA infection nor to guide or monitor treatment for MRSA infections.      Time coordinating discharge: 35 minutes  SIGNED:   Aline August, MD  [facility] 09/05/2017, 1:36 PM Pager: [phone]  If 7PM-7AM, please contact night-coverage [email or web address] Password [email or web address]

## 2017-09-05 NOTE — Transfer of Care (Signed)
Immediate Anesthesia Transfer of Care Note  Patient: Christie Maynard  Procedure(s) Performed: CYSTOSCOPY WITH LEFT URETERAL STENT REPLACEMENT (Left Ureter)  Patient Location: PACU  Anesthesia Type:General  Level of Consciousness: awake, alert , oriented and patient cooperative  Airway & Oxygen Therapy: Patient Spontanous Breathing and Patient connected to face mask oxygen  Post-op Assessment: Report given to RN, Post -op Vital signs reviewed and stable and Patient moving all extremities X 4  Post vital signs: stable  Last Vitals:  Vitals:   09/04/17 2306 09/05/17 0441  BP: (!) 150/79 128/75  Pulse: 83 77  Resp: 20 20  Temp: 36.7 C 36.8 C  SpO2: 98% 97%    Last Pain:  Vitals:   09/05/17 0824  TempSrc:   PainSc: 0-No pain         Complications: No apparent anesthesia complications

## 2017-09-05 NOTE — Progress Notes (Signed)
Pt has eaten and no complaints of nausea or vomiting. Pt has ambulated in the hallway without any difficulties. VSS and Pt to be discharged to home this evening

## 2017-09-05 NOTE — Progress Notes (Signed)
Patient ID: ALENA BLANKENBECKLER, female   DOB: [birth year], 61 y.o.   MRN: [id number]    Subjective: Pt doing better today.  Still feeling improved since paracentesis.  She denies new left flank or abdominal pain.  Objective: Vital signs in last 24 hours: Temp:  [98.1 F (36.7 C)-98.7 F (37.1 C)] 98.3 F (36.8 C) (12/23 0441) Pulse Rate:  [77-83] 77 (12/23 0441) Resp:  [20] 20 (12/23 0441) BP: (128-152)/(75-92) 128/75 (12/23 0441) SpO2:  [97 %-98 %] 97 % (12/23 0441)  Intake/Output from previous day: No intake/output data recorded. Intake/Output this shift: No intake/output data recorded.  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  Will proceed later this morning.   LOS: 0 days   Braeden Kennan,LES 09/05/2017, 8:34 AM

## 2017-09-06 ENCOUNTER — Encounter (HOSPITAL_COMMUNITY): Payer: Self-pay | Admitting: Urology

## 2017-09-06 LAB — TRIGLYCERIDES, BODY FLUIDS: TRIGLYCERIDES FL: 42 mg/dL

## 2017-09-06 LAB — MITOCHONDRIAL ANTIBODIES: Mitochondrial M2 Ab, IgG: 7.4 Units (ref 0.0–20.0)

## 2017-09-06 LAB — ANA W/REFLEX IF POSITIVE: ANA: NEGATIVE

## 2017-09-08 ENCOUNTER — Encounter: Payer: Self-pay | Admitting: Gynecologic Oncology

## 2017-09-08 ENCOUNTER — Ambulatory Visit: Payer: BLUE CROSS/BLUE SHIELD | Attending: Gynecologic Oncology | Admitting: Gynecologic Oncology

## 2017-09-08 VITALS — BP 158/100 | HR 120 | Temp 98.4°F | Resp 20 | Wt 148.3 lb

## 2017-09-08 DIAGNOSIS — C563 Malignant neoplasm of bilateral ovaries: Secondary | ICD-10-CM

## 2017-09-08 DIAGNOSIS — Z79899 Other long term (current) drug therapy: Secondary | ICD-10-CM | POA: Diagnosis not present

## 2017-09-08 DIAGNOSIS — R569 Unspecified convulsions: Secondary | ICD-10-CM | POA: Diagnosis not present

## 2017-09-08 DIAGNOSIS — R188 Other ascites: Secondary | ICD-10-CM | POA: Insufficient documentation

## 2017-09-08 DIAGNOSIS — N950 Postmenopausal bleeding: Secondary | ICD-10-CM | POA: Diagnosis present

## 2017-09-08 DIAGNOSIS — Z801 Family history of malignant neoplasm of trachea, bronchus and lung: Secondary | ICD-10-CM | POA: Diagnosis not present

## 2017-09-08 DIAGNOSIS — N135 Crossing vessel and stricture of ureter without hydronephrosis: Secondary | ICD-10-CM | POA: Diagnosis not present

## 2017-09-08 DIAGNOSIS — E279 Disorder of adrenal gland, unspecified: Secondary | ICD-10-CM | POA: Insufficient documentation

## 2017-09-08 DIAGNOSIS — E46 Unspecified protein-calorie malnutrition: Secondary | ICD-10-CM | POA: Diagnosis not present

## 2017-09-08 DIAGNOSIS — Z803 Family history of malignant neoplasm of breast: Secondary | ICD-10-CM | POA: Diagnosis not present

## 2017-09-08 DIAGNOSIS — E43 Unspecified severe protein-calorie malnutrition: Secondary | ICD-10-CM | POA: Diagnosis not present

## 2017-09-08 DIAGNOSIS — C569 Malignant neoplasm of unspecified ovary: Secondary | ICD-10-CM | POA: Diagnosis not present

## 2017-09-08 DIAGNOSIS — C562 Malignant neoplasm of left ovary: Secondary | ICD-10-CM

## 2017-09-08 DIAGNOSIS — C561 Malignant neoplasm of right ovary: Secondary | ICD-10-CM

## 2017-09-08 DIAGNOSIS — N838 Other noninflammatory disorders of ovary, fallopian tube and broad ligament: Secondary | ICD-10-CM

## 2017-09-08 DIAGNOSIS — N939 Abnormal uterine and vaginal bleeding, unspecified: Secondary | ICD-10-CM

## 2017-09-08 NOTE — Patient Instructions (Signed)
We will arrange for you to meet with Dr. Heath Lark to discuss and arrange chemotherapy.  We will also refer you to meet with the Genetics counselor.  The plan will be for consideration of surgery after completion of three cycles of chemotherapy with imaging.  Please call for any questions or concerns.

## 2017-09-08 NOTE — Progress Notes (Signed)
Consult Note: Gyn-Onc  Consult was requested by Dr. Jacinta Shoe for the evaluation of Christie Maynard 61 y.o. female  CC:  Chief Complaint  Patient presents with  . Ovarian mass    Assessment/Plan:  Christie Maynard  is a 61 y.o.  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  I discussed that I believe she likely has stage IIIC ovarian cancer. I discussed that the treatment approach for this disease is typically combination of cytoreductive surgery and chemotherapy. I discussed that sequencing of this can be either with upfront debulking followed by adjuvant chemotherapy sequentially or neoadjuvant chemotherapy followed by an interval cytoreductive attempt, then additional chemotherapy. This latter approach is associated with a reduced perioperative morbidity at the time of surgery. I discussed that the goal of optimal sequencing is to optimise the likelihood that cytoreductive effect can be optimal to less than 1 cm of residual disease, and would not induce morbidity for the patient that would result in a delay of adjuvant chemotherapy. I discussed that it is an individual decision process that takes into account individual patient health, and preference factors, in addition to the apparent tumor distribution on imaging. I discussed that the overall survival observed in patients is equivalent for both approaches provided that there is an optimal cytoreductive effort at the time of surgery (regardless of the timing of that surgery).  Given her poor nutritional status and the fixed pelvic mass with apparent left retroperitoneal extension, I feel that an interval debulking performed after 3 cycles of neoaduvant carboplatin and paclitaxel is most appropriate as it will minimize the probability of needing a colostomy or ureteral reimplantation in the setting of poor nutrition.  We will facilitate her  appointments with medical oncology and due to her significant family history of breast cancer, with genetic counselors. If BRCA mutation is identified, she would be a candidate for primary therapy with PARP inhibitors.  I will see her back after 3 cycles of chemotherapy for repeat imaging, and to discuss a possible interval debulking surgery.   HPI: Christie Maynard is a very pleasant 61 year old G1P1 who is seen in consultation at the request of Dr. Domenic Polite for apparent stage IIIc ovarian cancer.  The patient has had vague abdominal symptoms since Thanksgiving of 2018.  The symptoms include intermittent nausea and low volume emesis.  She also notes substantial abdominal distention, and early satiety.  Her appetite is poor.  She has had some weight loss.  Due to progression in symptoms she was seen and evaluated at [facility] in early December 2018 which time she was treated for a urinary tract infection and prescribed anti-biotics.  She was not helped by this therapy and therefore re-presented again and was admitted to [facility] on September 03, 2017.  On 09/03/2017 a CT scan of the abdomen and pelvis with IV contrast was performed and this revealed small bilateral pleural effusions larger on the right.  A right adrenal mass measuring 2 cm.  Left hydronephrosis and hydroureter with the ureteral dilation terminating at a mass in the pelvis.  Large volume ascites.  Decompressed proximal and minimally dilated distal small bowel loops suggesting mild small bowel obstruction with a transition zone appearing in the right pelvis at the level of the distal ileum.  In the pelvis an irregular heterogeneous nodular appearing soft tissue masses were seen bilaterally concerning for ovarian malignancy.  The larger mass on the right of the midline measured 10.6 x 4.9 x 9.8 cm and the left-sided mass measured 8.2 x 4.9 x 4.8 cm.  The masses were contiguous with the uterus.  There  was extensive omental caking noted.  Due to the findings of hydroureter she was seen and evaluated by Dr. Dutch Gray.  A cystoscopy was performed on 09/05/2017.  This showed no masses or tumor within the bladder itself however there was extrinsic compression.  A stent was able to be passed retrograde up the left ureter.  After placement of the stent she began noticing some blood in her urine however before that time had denied vaginal bleeding.  On the day of discharge laboratory evaluation revealed albumin of 2.6.  Her creatinine was grossly normal at 0.9.  The patient is otherwise a fairly healthy woman.  She has had no prior abdominal surgeries.    No remarkable family history for breast cancer.  Her mother was diagnosed with breast cancer initially in her 60s with a contralateral breast cancer lesion identified a couple of decades later.  Her sister developed breast cancer in her 27s.  A maternal aunt has a history of breast cancer also probably in her 23s.  The patient's father has a history of lung cancer though was a heavy smoker.  A paternal aunt also had carried a diagnosis of breast cancer.  The patient denies knowing if any family members have been tested for BRCA deleterious mutations.   Current Meds:  Outpatient Encounter Medications as of 09/08/2017  Medication Sig  . ondansetron (ZOFRAN) 4 MG tablet Take 1 tablet (4 mg total) by mouth every 6 (six) hours as needed for nausea.  . phenytoin (DILANTIN) 100 MG ER capsule Take 100 mg by mouth daily.  . traMADol (ULTRAM) 50 MG tablet Take 1 tablet (50 mg total) by mouth every 6 (six) hours as needed for moderate pain.   No facility-administered encounter medications on file as of 09/08/2017.     Allergy: No Known Allergies  Social Hx:   Social History   Socioeconomic History  . Marital status: Single    Spouse name: Not on file  . Number of children: Not on file  . Years of education: Not on file  . Highest education level:  Not on file  Social Needs  . Financial resource strain: Not on file  . Food insecurity - worry: Not on file  . Food insecurity - inability: Not on file  . Transportation needs - medical: Not on file  . Transportation needs - non-medical: Not on file  Occupational History  . Not on file  Tobacco Use  . Smoking status: Never Smoker  . Smokeless tobacco: Never Used  Substance and Sexual Activity  . Alcohol use: No    Frequency: Never  . Drug use: No  . Sexual activity: Not on file  Other Topics Concern  . Not on file  Social History Narrative  . Not on file    Past Surgical Hx:  Past Surgical History:  Procedure Laterality Date  . CYSTOSCOPY W/ URETERAL STENT PLACEMENT Left 09/05/2017   Procedure: CYSTOSCOPY WITH LEFT URETERAL STENT REPLACEMENT;  Surgeon: Raynelle Bring, MD;  Location: [facility];  Service: Urology;  Laterality: Left;  . NO PAST SURGERIES      Past Medical Hx:  Past Medical History:  Diagnosis Date  . Seizures ([place])   . UTI (urinary tract infection)     Past Gynecological History:  SVD  x 1 No LMP recorded. Patient is postmenopausal.  Family Hx:  Family History  Problem Relation Age of Onset  . Breast cancer Mother   . Lung cancer Father   . Breast cancer Sister   . Breast cancer Maternal Aunt   . Breast cancer Paternal Aunt   . Cancer Paternal Uncle     Review of Systems:  Constitutional  Feels fatigued and weak  ENT Normal appearing ears and nares bilaterally Skin/Breast  No rash, sores, jaundice, itching, dryness Cardiovascular  No chest pain, shortness of breath, or edema  Pulmonary  No cough or wheeze.  Gastro Intestinal  + distension, nausea, vomitting, but no diarrhoea. No bright red blood per rectum, no abdominal pain, change in bowel movement, or constipation. + flatus Genito Urinary  No frequency, urgency, dysuria, + hematuria, + vaginal bleeding Musculo Skeletal  No myalgia, arthralgia, joint swelling or pain  Neurologic  No  weakness, numbness, change in gait,  Psychology  No depression, anxiety, insomnia.   Vitals:  Blood pressure (!) 158/100, pulse (!) 120, temperature 98.4 F (36.9 C), temperature source Oral, resp. rate 20, weight 148 lb 4.8 oz (67.3 kg).  Physical Exam: WD in NAD Neck  Supple NROM, without any enlargements.  Lymph Node Survey No cervical supraclavicular or inguinal adenopathy Cardiovascular  Pulse normal rate, regularity and rhythm. S1 and S2 normal.  Lungs  Clear to auscultation bilateraly, without wheezes/crackles/rhonchi. Good air movement.  Skin  No rash/lesions/breakdown  Psychiatry  Alert and oriented to person, place, and time  Abdomen  Normoactive bowel sounds, distended, abdomen soft with no palpable masses, non-tender and nonobese without evidence of hernia.  Back No CVA tenderness Genito Urinary  Vulva/vagina: Normal external female genitalia.  No lesions. No discharge or bleeding.  Bladder/urethra:  No lesions or masses, well supported bladder  Vagina: normal but with blood in the vagina emanating from cervix  Cervix: Normal appearing, no lesions. Firm to palpate as though infiltrated with tumor (though mucosa appeared normal)  Uterus and adnexa: fixed, large, filling pelvis, immobile. Nodular. Retroperitoneal infiltration appreciated on left.  Rectal  Good tone, large fixed pelvic masses + cul de sac nodularity.  Extremities  No bilateral cyanosis, clubbing or edema.  PROCEDURE NOTE:  Endometrial biopsy Preop diagnosis postmenopausal bleeding Postop diagnosis same Procedure endometrial biopsy EBL minimal  complications none Specimen endometrial biopsy for pathology Procedure: Verbal consent was obtained, timeout was performed.  An endometrial Pipelle biopsy was attempted to be passed through the cervix however due to obstruction within the cervical awes this was not initially possible.  Cervix was grasped with single-tooth tenaculum to stabilize the cervix.   Again the pelvis was attempted to be passed but could not pass beyond 2 cm due to an internal lesion obstructing the canal.  A small amount of tissue was aspirated.  The endocervical curette was then inserted into the cervical Allis and a scraping was obtained and collected with a brush and all sent as the same specimen.  The patient tolerated the procedure well with no complications.  60 minutes of face to face counseling time was spent with the patient and her family discussing imaging results, plan for therapy, prognosis.  Donaciano Eva, MD  09/08/2017, 1:39 PM

## 2017-09-09 ENCOUNTER — Telehealth: Payer: Self-pay | Admitting: *Deleted

## 2017-09-09 LAB — CULTURE, BODY FLUID W GRAM STAIN -BOTTLE: Culture: NO GROWTH

## 2017-09-09 LAB — CULTURE, BODY FLUID-BOTTLE

## 2017-09-09 NOTE — Telephone Encounter (Signed)
Patient's family member called and stated that they were going to set up a GYN appt with Dr. Linda Hedges at [facility].

## 2017-09-10 ENCOUNTER — Telehealth: Payer: Self-pay | Admitting: *Deleted

## 2017-09-10 LAB — CHOLESTEROL, BODY FLUID: CHOL FL: 70 mg/dL

## 2017-09-10 NOTE — Telephone Encounter (Signed)
Called and gave the patient the genetics appts.

## 2017-09-13 ENCOUNTER — Telehealth: Payer: Self-pay | Admitting: Gynecologic Oncology

## 2017-09-13 ENCOUNTER — Other Ambulatory Visit: Payer: Self-pay | Admitting: Gynecologic Oncology

## 2017-09-13 ENCOUNTER — Ambulatory Visit (HOSPITAL_COMMUNITY)
Admission: RE | Admit: 2017-09-13 | Discharge: 2017-09-13 | Disposition: A | Discharge status: Home / Self Care | Payer: BLUE CROSS/BLUE SHIELD | Source: Ambulatory Visit | Attending: Gynecologic Oncology | Admitting: Gynecologic Oncology

## 2017-09-13 ENCOUNTER — Encounter (HOSPITAL_COMMUNITY): Payer: Self-pay | Admitting: Student

## 2017-09-13 DIAGNOSIS — R180 Malignant ascites: Secondary | ICD-10-CM

## 2017-09-13 DIAGNOSIS — E860 Dehydration: Secondary | ICD-10-CM | POA: Diagnosis not present

## 2017-09-13 DIAGNOSIS — C180 Malignant neoplasm of cecum: Secondary | ICD-10-CM | POA: Diagnosis not present

## 2017-09-13 HISTORY — PX: IR PARACENTESIS: IMG2679

## 2017-09-13 MED ORDER — LIDOCAINE HCL (PF) 1 % IJ SOLN
INTRAMUSCULAR | Status: DC | PRN
  Administered 2017-09-13: 10 mL

## 2017-09-13 MED ORDER — LIDOCAINE 2% (20 MG/ML) 5 ML SYRINGE
INTRAMUSCULAR | Status: AC
  Filled 2017-09-13: qty 10

## 2017-09-13 NOTE — Telephone Encounter (Signed)
Spoke with patient about cytology findings from previous paracentesis.  Dr. Denman George informed as well.  Plan to sent the fluid from today for cytology as well to see if a definitive diagnosis can be obtained.  Advised that appt with Dr. Alvy Bimler for Wed would be cancelled at this time since GI evaluation needs to be performed.  Patient has an appt at [place] on Wed, Jan 2 with Dr. Therisa Doyne with arrival at 8:45 for a 9am appointment.  Directions given.  All questions answered.  Advised that I would start working on an appt with Dr. Benay Spice as well.  Advised to call for any needs or concerns.

## 2017-09-13 NOTE — Procedures (Signed)
PROCEDURE SUMMARY:  Successful US guided paracentesis from left lateral abdomen.  Yielded 2.0 liters of red-colored fluid.  No immediate complications.  Pt tolerated well.   Specimen was sent for labs.  Docia Barrier PA-C 09/13/2017 3:18 PM

## 2017-09-13 NOTE — Telephone Encounter (Signed)
Informed patient's husband about upcoming paracentesis today at [facility].  All questions answered.  Directions given.

## 2017-09-13 NOTE — Progress Notes (Signed)
Patient's husband called this am stating his wife is symptomatic with her abdominal ascites and he feels it is worse than the previous episode prior to having a paracentesis.  Per Dr. Denman George, plan for paracentesis today.

## 2017-09-14 ENCOUNTER — Emergency Department (HOSPITAL_COMMUNITY): Payer: BLUE CROSS/BLUE SHIELD

## 2017-09-14 ENCOUNTER — Inpatient Hospital Stay (HOSPITAL_COMMUNITY)
Admission: EM | Admit: 2017-09-14 | Discharge: 2017-10-08 | DRG: 326 | Disposition: A | Discharge status: Home / Self Care | Payer: BLUE CROSS/BLUE SHIELD | Attending: Internal Medicine | Admitting: Internal Medicine

## 2017-09-14 ENCOUNTER — Other Ambulatory Visit: Payer: Self-pay

## 2017-09-14 ENCOUNTER — Observation Stay (HOSPITAL_COMMUNITY): Payer: BLUE CROSS/BLUE SHIELD

## 2017-09-14 ENCOUNTER — Encounter (HOSPITAL_COMMUNITY): Payer: Self-pay | Admitting: Emergency Medicine

## 2017-09-14 DIAGNOSIS — R531 Weakness: Secondary | ICD-10-CM

## 2017-09-14 DIAGNOSIS — N179 Acute kidney failure, unspecified: Secondary | ICD-10-CM | POA: Diagnosis present

## 2017-09-14 DIAGNOSIS — Z801 Family history of malignant neoplasm of trachea, bronchus and lung: Secondary | ICD-10-CM

## 2017-09-14 DIAGNOSIS — E872 Acidosis: Secondary | ICD-10-CM | POA: Diagnosis present

## 2017-09-14 DIAGNOSIS — N390 Urinary tract infection, site not specified: Secondary | ICD-10-CM | POA: Clinically undetermined

## 2017-09-14 DIAGNOSIS — C180 Malignant neoplasm of cecum: Principal | ICD-10-CM | POA: Diagnosis present

## 2017-09-14 DIAGNOSIS — K6389 Other specified diseases of intestine: Secondary | ICD-10-CM | POA: Diagnosis present

## 2017-09-14 DIAGNOSIS — C182 Malignant neoplasm of ascending colon: Secondary | ICD-10-CM

## 2017-09-14 DIAGNOSIS — Z803 Family history of malignant neoplasm of breast: Secondary | ICD-10-CM

## 2017-09-14 DIAGNOSIS — Z515 Encounter for palliative care: Secondary | ICD-10-CM | POA: Diagnosis present

## 2017-09-14 DIAGNOSIS — R112 Nausea with vomiting, unspecified: Secondary | ICD-10-CM

## 2017-09-14 DIAGNOSIS — Z4659 Encounter for fitting and adjustment of other gastrointestinal appliance and device: Secondary | ICD-10-CM

## 2017-09-14 DIAGNOSIS — G40909 Epilepsy, unspecified, not intractable, without status epilepticus: Secondary | ICD-10-CM | POA: Diagnosis present

## 2017-09-14 DIAGNOSIS — K219 Gastro-esophageal reflux disease without esophagitis: Secondary | ICD-10-CM | POA: Diagnosis present

## 2017-09-14 DIAGNOSIS — C786 Secondary malignant neoplasm of retroperitoneum and peritoneum: Secondary | ICD-10-CM

## 2017-09-14 DIAGNOSIS — Z87898 Personal history of other specified conditions: Secondary | ICD-10-CM

## 2017-09-14 DIAGNOSIS — D638 Anemia in other chronic diseases classified elsewhere: Secondary | ICD-10-CM | POA: Diagnosis present

## 2017-09-14 DIAGNOSIS — R188 Other ascites: Secondary | ICD-10-CM | POA: Diagnosis present

## 2017-09-14 DIAGNOSIS — K9189 Other postprocedural complications and disorders of digestive system: Secondary | ICD-10-CM | POA: Diagnosis present

## 2017-09-14 DIAGNOSIS — K56699 Other intestinal obstruction unspecified as to partial versus complete obstruction: Secondary | ICD-10-CM | POA: Diagnosis present

## 2017-09-14 DIAGNOSIS — C189 Malignant neoplasm of colon, unspecified: Secondary | ICD-10-CM

## 2017-09-14 DIAGNOSIS — R1900 Intra-abdominal and pelvic swelling, mass and lump, unspecified site: Secondary | ICD-10-CM

## 2017-09-14 DIAGNOSIS — E43 Unspecified severe protein-calorie malnutrition: Secondary | ICD-10-CM | POA: Diagnosis present

## 2017-09-14 DIAGNOSIS — K56609 Unspecified intestinal obstruction, unspecified as to partial versus complete obstruction: Secondary | ICD-10-CM | POA: Diagnosis present

## 2017-09-14 DIAGNOSIS — C801 Malignant (primary) neoplasm, unspecified: Secondary | ICD-10-CM

## 2017-09-14 DIAGNOSIS — K567 Ileus, unspecified: Secondary | ICD-10-CM | POA: Diagnosis not present

## 2017-09-14 DIAGNOSIS — D72829 Elevated white blood cell count, unspecified: Secondary | ICD-10-CM | POA: Diagnosis present

## 2017-09-14 DIAGNOSIS — R7989 Other specified abnormal findings of blood chemistry: Secondary | ICD-10-CM | POA: Diagnosis present

## 2017-09-14 DIAGNOSIS — E860 Dehydration: Secondary | ICD-10-CM

## 2017-09-14 DIAGNOSIS — N131 Hydronephrosis with ureteral stricture, not elsewhere classified: Secondary | ICD-10-CM | POA: Diagnosis present

## 2017-09-14 DIAGNOSIS — E876 Hypokalemia: Secondary | ICD-10-CM | POA: Diagnosis present

## 2017-09-14 DIAGNOSIS — J90 Pleural effusion, not elsewhere classified: Secondary | ICD-10-CM | POA: Diagnosis present

## 2017-09-14 DIAGNOSIS — E8770 Fluid overload, unspecified: Secondary | ICD-10-CM | POA: Diagnosis present

## 2017-09-14 HISTORY — DX: Malignant neoplasm of unspecified ovary: C56.9

## 2017-09-14 HISTORY — DX: Malignant (primary) neoplasm, unspecified: C80.1

## 2017-09-14 LAB — COMPREHENSIVE METABOLIC PANEL
ALBUMIN: 3.1 g/dL — AB (ref 3.5–5.0)
ALT: 51 U/L (ref 14–54)
ANION GAP: 17 — AB (ref 5–15)
AST: 66 U/L — AB (ref 15–41)
Alkaline Phosphatase: 162 U/L — ABNORMAL HIGH (ref 38–126)
BILIRUBIN TOTAL: 1.6 mg/dL — AB (ref 0.3–1.2)
BUN: 20 mg/dL (ref 6–20)
CO2: 19 mmol/L — ABNORMAL LOW (ref 22–32)
Calcium: 9.3 mg/dL (ref 8.9–10.3)
Chloride: 96 mmol/L — ABNORMAL LOW (ref 101–111)
Creatinine, Ser: 1.04 mg/dL — ABNORMAL HIGH (ref 0.44–1.00)
GFR calc Af Amer: >60 mL/min (ref >60)
GFR calc non Af Amer: 56 mL/min — ABNORMAL LOW (ref >60)
Glucose, Bld: 109 mg/dL — ABNORMAL HIGH (ref 65–99)
POTASSIUM: 4.3 mmol/L (ref 3.5–5.1)
SODIUM: 132 mmol/L — AB (ref 135–145)
TOTAL PROTEIN: 7.4 g/dL (ref 6.5–8.1)

## 2017-09-14 LAB — URINALYSIS, ROUTINE W REFLEX MICROSCOPIC
Bilirubin Urine: NEGATIVE
GLUCOSE, UA: NEGATIVE mg/dL
Ketones, ur: 80 mg/dL — AB
NITRITE: NEGATIVE
PROTEIN: 100 mg/dL — AB
Specific Gravity, Urine: 1.021 (ref 1.005–1.030)
pH: 5 (ref 5.0–8.0)

## 2017-09-14 LAB — CBC
HEMATOCRIT: 43.6 % (ref 36.0–46.0)
HEMOGLOBIN: 14.7 g/dL (ref 12.0–15.0)
MCH: 29.1 pg (ref 26.0–34.0)
MCHC: 33.7 g/dL (ref 30.0–36.0)
MCV: 86.2 fL (ref 78.0–100.0)
Platelets: 446 10*3/uL — ABNORMAL HIGH (ref 150–400)
RBC: 5.06 MIL/uL (ref 3.87–5.11)
RDW: 14.2 % (ref 11.5–15.5)
WBC: 14.2 10*3/uL — ABNORMAL HIGH (ref 4.0–10.5)

## 2017-09-14 LAB — LIPASE, BLOOD: Lipase: 69 U/L — ABNORMAL HIGH (ref 11–51)

## 2017-09-14 LAB — LACTIC ACID, PLASMA: Lactic Acid, Venous: 1.6 mmol/L (ref 0.5–1.9)

## 2017-09-14 MED ORDER — ONDANSETRON HCL 4 MG PO TABS: 4.0000 mg | ORAL_TABLET | Freq: Four times a day (QID) | ORAL | Status: DC | PRN

## 2017-09-14 MED ORDER — ENOXAPARIN SODIUM 40 MG/0.4ML ~~LOC~~ SOLN
40.0000 mg | SUBCUTANEOUS | Status: DC
  Administered 2017-09-14 – 2017-09-15 (×2): 40 mg via SUBCUTANEOUS
  Filled 2017-09-14 (×2): qty 0.4

## 2017-09-14 MED ORDER — SODIUM CHLORIDE 0.9 % IV SOLN
INTRAVENOUS | Status: AC
  Administered 2017-09-14: 125 mL/h via INTRAVENOUS
  Administered 2017-09-15 (×3): via INTRAVENOUS

## 2017-09-14 MED ORDER — ONDANSETRON HCL 4 MG/2ML IJ SOLN
4.0000 mg | Freq: Once | INTRAMUSCULAR | Status: DC | PRN
  Filled 2017-09-14: qty 2

## 2017-09-14 MED ORDER — ONDANSETRON HCL 4 MG/2ML IJ SOLN
4.0000 mg | Freq: Four times a day (QID) | INTRAMUSCULAR | Status: DC | PRN
  Administered 2017-09-15 – 2017-10-03 (×3): 4 mg via INTRAVENOUS
  Filled 2017-09-14 (×4): qty 2

## 2017-09-14 MED ORDER — ONDANSETRON HCL 4 MG/2ML IJ SOLN
4.0000 mg | Freq: Once | INTRAMUSCULAR | Status: AC
  Administered 2017-09-14: 4 mg via INTRAVENOUS
  Filled 2017-09-14: qty 2

## 2017-09-14 MED ORDER — IOPAMIDOL (ISOVUE-300) INJECTION 61%
100.0000 mL | Freq: Once | INTRAVENOUS | Status: AC | PRN
  Administered 2017-09-14: 80 mL via INTRAVENOUS

## 2017-09-14 MED ORDER — PHENYTOIN SODIUM 50 MG/ML IJ SOLN
100.0000 mg | Freq: Every day | INTRAMUSCULAR | Status: DC
  Administered 2017-09-15 – 2017-10-08 (×25): 100 mg via INTRAVENOUS
  Filled 2017-09-14 (×28): qty 2

## 2017-09-14 MED ORDER — SODIUM CHLORIDE 0.9 % IV BOLUS (SEPSIS)
500.0000 mL | Freq: Once | INTRAVENOUS | Status: AC
  Administered 2017-09-14: 500 mL via INTRAVENOUS

## 2017-09-14 MED ORDER — IOPAMIDOL (ISOVUE-300) INJECTION 61%
INTRAVENOUS | Status: AC
  Filled 2017-09-14: qty 100

## 2017-09-14 MED ORDER — PHENYTOIN SODIUM 50 MG/ML IJ SOLN
100.0000 mg | Freq: Every day | INTRAMUSCULAR | Status: DC
Start: 2017-09-15 — End: 2017-09-14

## 2017-09-14 NOTE — ED Provider Notes (Signed)
[facility] DEPT Provider Note   CSN: [id number] Arrival date & time: 09/14/17  1118     History   Chief Complaint Chief Complaint  Patient presents with  . Nausea  . Emesis    HPI Christie Maynard is a 62 y.o. female.  The history is provided by the patient and the spouse. No language interpreter was used.  Emesis   This is a new problem. The current episode started more than 1 week ago. The problem occurs continuously. The problem has been gradually worsening. There has been no fever. Pertinent negatives include no abdominal pain.   Family reports pt is unable to eat or drink.  Pt was diagnosed with cervical cancer on 12/21. Pt has had multiple episodes of abdominal swelling.  Pt had a paracentesis 1 days ago.  She had a paracentesis last week.  Pt reports she vomits anything she puts in her mouth.  Husband reports pt is unable to eat or drink anything.  He is concerned because pt has had no nourishment since admission a week ago.  Pt has increasing weakness,  She has been seen by Gyn Onc.  She is scheduled to see Gi tomorrow for colonoscopy and endoscopy.   Past Medical History:  Diagnosis Date  . Cancer ([place])   . Ovarian ca ([place])   . Seizures ([place])   . UTI (urinary tract infection)     Patient Active Problem List   Diagnosis Date Noted  . Protein-calorie malnutrition, severe ([place]) 09/08/2017  . Ovarian mass 09/05/2017  . Other ascites 09/03/2017  . Ascites 09/03/2017    Past Surgical History:  Procedure Laterality Date  . CYSTOSCOPY W/ URETERAL STENT PLACEMENT Left 09/05/2017   Procedure: CYSTOSCOPY WITH LEFT URETERAL STENT REPLACEMENT;  Surgeon: Raynelle Bring, MD;  Location: [facility];  Service: Urology;  Laterality: Left;  . IR PARACENTESIS  09/13/2017  . NO PAST SURGERIES      OB History    No data available       Home Medications    Prior to Admission medications   Medication Sig Start Date End Date Taking? Authorizing  Provider  ibuprofen (ADVIL,MOTRIN) 200 MG tablet Take 400 mg by mouth every 6 (six) hours as needed for headache.   Yes [provider]  ondansetron (ZOFRAN) 4 MG tablet Take 1 tablet (4 mg total) by mouth every 6 (six) hours as needed for nausea. 09/05/17  Yes Aline August, MD  phenytoin (DILANTIN) 100 MG ER capsule Take 100 mg by mouth daily. 01/18/17  Yes [provider]  traMADol (ULTRAM) 50 MG tablet Take 1 tablet (50 mg total) by mouth every 6 (six) hours as needed for moderate pain. 09/05/17 09/05/18 Yes Aline August, MD    Family History Family History  Problem Relation Age of Onset  . Breast cancer Mother   . Lung cancer Father   . Breast cancer Sister   . Breast cancer Maternal Aunt   . Breast cancer Paternal Aunt   . Cancer Paternal Uncle     Social History Social History   Tobacco Use  . Smoking status: Never Smoker  . Smokeless tobacco: Never Used  Substance Use Topics  . Alcohol use: No    Frequency: Never  . Drug use: No     Allergies   Patient has no known allergies.   Review of Systems Review of Systems  Gastrointestinal: Positive for vomiting. Negative for abdominal pain.  All other systems reviewed and are negative.  Physical Exam Updated Vital Signs BP 122/83 (BP Location: Left Arm)   Pulse 99   Temp 97.9 F (36.6 C) (Oral)   Resp 17   Ht 5\' 2"  (1.575 m)   Wt 68 kg (150 lb)   SpO2 98%   BMI 27.44 kg/m   Physical Exam  Constitutional: She appears well-developed and well-nourished. No distress.  HENT:  Head: Normocephalic and atraumatic.  Eyes: Conjunctivae are normal.  Neck: Neck supple.  Cardiovascular: Regular rhythm.  No murmur heard. tachycardia  Pulmonary/Chest: Effort normal and breath sounds normal. No respiratory distress.  Abdominal: Soft. There is no tenderness.  Mild swelling  Musculoskeletal: She exhibits no edema.  Neurological: She is alert.  Skin: Skin is warm and dry.  Psychiatric: She has a  normal mood and affect.  Nursing note and vitals reviewed.    ED Treatments / Results  Labs (all labs ordered are listed, but only abnormal results are displayed) Labs Reviewed  LIPASE, BLOOD - Abnormal; Notable for the following components:      Result Value   Lipase 69 (*)    All other components within normal limits  COMPREHENSIVE METABOLIC PANEL - Abnormal; Notable for the following components:   Sodium 132 (*)    Chloride 96 (*)    CO2 19 (*)    Glucose, Bld 109 (*)    Creatinine, Ser 1.04 (*)    Albumin 3.1 (*)    AST 66 (*)    Alkaline Phosphatase 162 (*)    Total Bilirubin 1.6 (*)    GFR calc non Af Amer 56 (*)    Anion gap 17 (*)    All other components within normal limits  CBC - Abnormal; Notable for the following components:   WBC 14.2 (*)    Platelets 446 (*)    All other components within normal limits  URINALYSIS, ROUTINE W REFLEX MICROSCOPIC    EKG  EKG Interpretation None       Radiology Ir Paracentesis  Result Date: 09/13/2017 INDICATION: Patient with recurrent ascites. Request is made for diagnostic and therapeutic paracentesis. EXAM: ULTRASOUND GUIDED DIAGNOSTIC AND THERAPEUTIC PARACENTESIS MEDICATIONS: 10 mL 2% lidocaine COMPLICATIONS: None immediate. PROCEDURE: Informed written consent was obtained from the patient after a discussion of the risks, benefits and alternatives to treatment. A timeout was performed prior to the initiation of the procedure. Initial ultrasound scanning demonstrates a large amount of ascites within the left lateral abdomen. The left lateral abdomen was prepped and draped in the usual sterile fashion. 2% lidocaine was used for local anesthesia. Following this, a 19 gauge, 7-cm, Yueh catheter was introduced. An ultrasound image was saved for documentation purposes. The paracentesis was performed. The catheter was removed and a dressing was applied. The patient tolerated the procedure well without immediate post procedural  complication. FINDINGS: A total of approximately 2.0 liters of red-colored fluid was removed. Samples were sent to the laboratory as requested by the clinical team. IMPRESSION: Successful ultrasound-guided paracentesis yielding 2.0 liters of peritoneal fluid. Read by:  Brynda Greathouse PA-C Electronically Signed   By: Jerilynn Mages.  Shick M.D.   On: 09/13/2017 15:21    Procedures Procedures (including critical care time)  Medications Ordered in ED Medications  ondansetron (ZOFRAN) injection 4 mg (not administered)  sodium chloride 0.9 % bolus 500 mL (0 mLs Intravenous Stopped 09/14/17 1443)  ondansetron (ZOFRAN) injection 4 mg (4 mg Intravenous Given 09/14/17 1413)     Initial Impression / Assessment and Plan / ED Course  I  have reviewed the triage vital signs and the nursing notes.  Pertinent labs & imaging results that were available during my care of the patient were reviewed by me and considered in my medical decision making (see chart for details).     Pt given Iv fluids,  Labs reviewed.  I discussed with Dr. Florene Glen hospitalist who will admit.   Final Clinical Impressions(s) / ED Diagnoses   Final diagnoses:  Dehydration  Weakness  Nausea and vomiting, intractability of vomiting not specified, unspecified vomiting type    ED Discharge Orders    None       Fransico Meadow, [place] 09/14/17 1702    Daleen Bo, MD 09/15/17 [phone]

## 2017-09-14 NOTE — ED Notes (Signed)
ED Provider at bedside. 

## 2017-09-14 NOTE — H&P (Addendum)
History and Physical    Christie Maynard ZYS:[id number] DOB: 10/03/1955 DOA: 09/14/2017  PCP: Aletha Halim., PA-C  Patient coming from: home  Chief Complaint: nausea and vomiting  HPI: Christie Maynard is Christie Maynard 62 y.o. 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    She notes that she has not been able to keep anything down since yesterday evening.  Around 7:00 she developed nausea and vomiting with everything that she tries to drink.  She has been unable to eat solid foods for the past few weeks, since prior to her most recent hospitalization, but last night things seem to have gotten worse with emesis every time she eats (nonbloody, appears like whatever she just drank).  Had Terran Hollenkamp paracentesis on 12/31, and actually was feeling better after this initially.  Prior to this she was feeling bloated and uncomfortable.  But around 7:00 on 1231 is when she developed the nausea and vomiting.  Since her discharge her symptoms have been up and down she notes.  She has had some watery bowel movements but nothing solid recently.  She denies fevers, chills, chest pain, shortness of breath, numbness, tingling, weakness.  She was planning on following up with GI tomorrow due to cytology results that were concerning for adenocarcinoma.  ED Course: Labs, urinalysis, fluids.  Abd acute with chest.  Admit for inability to take PO.  Review of Systems: As per HPI otherwise 10 point review of systems negative.   Past Medical History:  Diagnosis Date  . Cancer ([place])   . Ovarian ca ([place])   . Seizures ([place])   . UTI (urinary tract infection)     Past Surgical History:  Procedure Laterality Date  . CYSTOSCOPY W/ URETERAL STENT PLACEMENT Left 09/05/2017   Procedure: CYSTOSCOPY WITH LEFT URETERAL STENT REPLACEMENT;  Surgeon: Raynelle Bring, MD;  Location: [facility];  Service: Urology;  Laterality: Left;  . IR PARACENTESIS  09/13/2017  . NO PAST SURGERIES       reports that  has never smoked. she has never used smokeless tobacco. She reports that she does not drink alcohol or use drugs.  No Known Allergies  Family History  Problem Relation Age of Onset  . Breast cancer Mother   . Lung cancer Father   . Breast cancer Sister   . Breast cancer Maternal Aunt   . Breast cancer Paternal Aunt   . Cancer Paternal Uncle    Prior to Admission medications   Medication Sig Start Date End Date Taking? Authorizing Provider  ibuprofen (ADVIL,MOTRIN) 200 MG tablet Take 400 mg by mouth every 6 (six) hours as needed for headache.   Yes [provider]  ondansetron (ZOFRAN) 4 MG tablet Take 1 tablet (4 mg total) by mouth every 6 (six) hours as needed for nausea. 09/05/17  Yes Aline August, MD  phenytoin (DILANTIN) 100 MG ER capsule Take 100 mg by mouth daily. 01/18/17  Yes [provider]  traMADol (ULTRAM) 50 MG tablet Take 1 tablet (50 mg total) by mouth every 6 (six) hours as needed for moderate pain. 09/05/17 09/05/18 Yes Aline August, MD    Physical Exam: Vitals:   09/14/17 1530 09/14/17 1600 09/14/17 1700 09/14/17 1730  BP: 124/77 122/86 118/86 121/78  Pulse: 100 (!) 101 (!) 101 99  Resp:    17  Temp:  TempSrc:      SpO2: 98% 99% 97% 97%  Weight:      Height:        Constitutional: NAD, calm, comfortable Vitals:   09/14/17 1530 09/14/17 1600 09/14/17 1700 09/14/17 1730  BP: 124/77 122/86 118/86 121/78  Pulse: 100 (!) 101 (!) 101 99  Resp:    17  Temp:      TempSrc:      SpO2: 98% 99% 97% 97%  Weight:      Height:       Eyes: PERRL, lids and conjunctivae normal ENMT: Mucous membranes are moist. Posterior pharynx clear of any exudate or lesions.Normal dentition.  Neck: normal, supple, no masses, no thyromegaly Respiratory: clear to auscultation bilaterally, no wheezing, no crackles.  Normal respiratory effort. No accessory muscle use.  Cardiovascular: Regular rate and rhythm, no murmurs / rubs / gallops. No extremity edema. 2+ pedal pulses. No carotid bruits.  Abdomen: no tenderness, no masses palpated. No hepatosplenomegaly. Bowel sounds tinkering.  Musculoskeletal: no clubbing / cyanosis. No joint deformity upper and lower extremities. Good ROM, no contractures. Normal muscle tone.  Skin: no rashes, lesions, ulcers. No induration Neurologic: CN 2-12 grossly intact Strength 5/5 in all 4.  Psychiatric: Normal judgment and insight. Alert and oriented x 3. Normal mood.   Labs on Admission: I have personally reviewed following labs and imaging studies  CBC: Recent Labs  Lab 09/14/17 1149  WBC 14.2*  HGB 14.7  HCT 43.6  MCV 86.2  PLT 160*   Basic Metabolic Panel: Recent Labs  Lab 09/14/17 1149  NA 132*  K 4.3  CL 96*  CO2 19*  GLUCOSE 109*  BUN 20  CREATININE 1.04*  CALCIUM 9.3   GFR: Estimated Creatinine Clearance: 50.1 mL/min (Vista Sawatzky) (by C-G formula based on SCr of 1.04 mg/dL (H)). Liver Function Tests: Recent Labs  Lab 09/14/17 1149  AST 66*  ALT 51  ALKPHOS 162*  BILITOT 1.6*  PROT 7.4  ALBUMIN 3.1*   Recent Labs  Lab 09/14/17 1149  LIPASE 69*   No results for input(s): AMMONIA in the last 168 hours. Coagulation Profile: No results for input(s): INR, PROTIME in the last 168 hours. Cardiac Enzymes: No results for input(s): CKTOTAL, CKMB, CKMBINDEX, TROPONINI in the last 168 hours. BNP (last 3 results) No results for input(s): PROBNP in the last 8760 hours. HbA1C: No results for input(s): HGBA1C in the last 72 hours. CBG: No results for input(s): GLUCAP in the last 168 hours. Lipid Profile: No results for input(s): CHOL, HDL, LDLCALC, TRIG, CHOLHDL, LDLDIRECT in the last 72 hours. Thyroid Function Tests: No results for input(s): TSH, T4TOTAL, FREET4, T3FREE, THYROIDAB in the last 72 hours. Anemia Panel: No results for input(s):  VITAMINB12, FOLATE, FERRITIN, TIBC, IRON, RETICCTPCT in the last 72 hours. Urine analysis:    Component Value Date/Time   COLORURINE AMBER (Christropher Gintz) 09/14/2017 1633   APPEARANCEUR CLOUDY (Garan Frappier) 09/14/2017 1633   LABSPEC 1.021 09/14/2017 1633   PHURINE 5.0 09/14/2017 1633   GLUCOSEU NEGATIVE 09/14/2017 1633   HGBUR MODERATE (Porscha Axley) 09/14/2017 1633   BILIRUBINUR NEGATIVE 09/14/2017 1633   KETONESUR 80 (Tyrique Sporn) 09/14/2017 1633   PROTEINUR 100 (Rogue Pautler) 09/14/2017 1633   NITRITE NEGATIVE 09/14/2017 1633   LEUKOCYTESUR MODERATE (Lamontae Ricardo) 09/14/2017 1633    Radiological Exams on Admission: Dg Abd Acute W/chest  Result Date: 09/14/2017 CLINICAL DATA:  Vomiting. EXAM: DG ABDOMEN ACUTE W/ 1V CHEST COMPARISON:  08/26/2017 FINDINGS: Left-sided nephroureteral stent is identified. Small bowel air-fluid levels are identified.  Small bowel loops are mildly increased in caliber measuring up to 2.6 cm. There is no evidence of free intraperitoneal air. Heart size and mediastinal contours are within normal limits. There are bilateral pleural effusions noted right greater than left. Both lungs are clear. IMPRESSION: 1. Mildly increased caliber of small bowel loops with air-fluid levels. Cannot rule out low grade partial obstruction. 2. Status post left ureteral stenting. 3. Bilateral pleural effusions. Electronically Signed   By: Kerby Moors M.D.   On: 09/14/2017 16:58   Ir Paracentesis  Result Date: 09/13/2017 INDICATION: Patient with recurrent ascites. Request is made for diagnostic and therapeutic paracentesis. EXAM: ULTRASOUND GUIDED DIAGNOSTIC AND THERAPEUTIC PARACENTESIS MEDICATIONS: 10 mL 2% lidocaine COMPLICATIONS: None immediate. PROCEDURE: Informed written consent was obtained from the patient after Iza Preston discussion of the risks, benefits and alternatives to treatment. Everley Evora timeout was performed prior to the initiation of the procedure. Initial ultrasound scanning demonstrates Avonda Toso large amount of ascites within the left lateral abdomen.  The left lateral abdomen was prepped and draped in the usual sterile fashion. 2% lidocaine was used for local anesthesia. Following this, Marielys Trinidad 19 gauge, 7-cm, Yueh catheter was introduced. An ultrasound image was saved for documentation purposes. The paracentesis was performed. The catheter was removed and Evangeline Utley dressing was applied. The patient tolerated the procedure well without immediate post procedural complication. FINDINGS: Ashon Rosenberg total of approximately 2.0 liters of red-colored fluid was removed. Samples were sent to the laboratory as requested by the clinical team. IMPRESSION: Successful ultrasound-guided paracentesis yielding 2.0 liters of peritoneal fluid. Read by:  Brynda Greathouse PA-C Electronically Signed   By: Jerilynn Mages.  Shick M.D.   On: 09/13/2017 15:21    EKG: Independently reviewed. none  Assessment/Plan Active Problems:   Nausea & vomiting   Peritoneal Carcinomatosis  Ascites  Nausea and vomiting  Possible Low Grade Partial Obstruction: Pt was following with Dr. 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  Plan was for carboplatin/paclitaxel x 3 cycles followed by debulking, but it looks like cytology from paracentesis on 12/22 has come back atypical cells suspicious for adenocarcinoma of GI tract and Chamille Werntz GI consult was pending.  She represents today with N/V, unable to keep any liquid down (hasn't been able to keep down solid foods for Unnamed Hino while now).  Plain films today with "mildly increased caliber of small bowel loops" "cannot rule out low grade partial obstruction" (her initial CT had concern for mild distal SBO). [ ]  Repeat CT abdomen/pelvis with contrast (consider small bowel protocol or surgery c/s based on results) - Dr. Denman George added to treatment teams, will need to consult gyn onc as well as oncology (doesn't look like she'd officially established with oncology yet) officially tomorrow (given findings from cytology, would discuss GI consult as well) - NPO, no NG tube for now as no vomiting -  antiemetics [ ]  f/u EKG for QT interval  Anion gap metabolic acidosis: likely starvation ketosis with UA, follow lactate  L ureteral obstruction s/p stent placement: Follow creatinine  Abnormal UA: follow culture, no clear sx  Leukocytosis: no infectious sx.  CTM.  Mildly elevated liver enzymes: continue to monitor, follow imaging above  Mildly elevated lipase: exam doesn't seem c/w pancreatitis, follow imaging  History of seizures: has had 1 seizure in 1980's, none since.  Stable on dilantin. Will give IV dilantin while NPO.  DVT prophylaxis: lovenox  Code Status: full  Family Communication: discussed with husband at bedside  Disposition Plan: pending  Consults called: Dr. Denman George and Dr.  Gorsuch added to treatment teams  Admission status: tele    Fayrene Helper MD [facility] Pager [phone]  If 7PM-7AM, please contact night-coverage [email or web address] Password [facility]  09/14/2017, 6:38 PM

## 2017-09-14 NOTE — ED Notes (Signed)
Bed: WA20 Expected date:  Expected time:  Means of arrival:  Comments: Tri 2

## 2017-09-14 NOTE — ED Notes (Signed)
Patient returned from xray.

## 2017-09-14 NOTE — ED Triage Notes (Signed)
Pt unable to keep any food or sips of  liquids in her stomach. Nausea, vomiting unresponsive to Zofran. Emesis is clear brown liquid. Vomited 4 x in last 12 hours. Pt reports passing "gas " but no stools in 2 weeks. Recent of ovarian CA. Husband at bedside. Pt is alert, orient, very weak. Stated that she can take a few steps to the bathroom.

## 2017-09-14 NOTE — ED Notes (Signed)
Patient family asking multiple questions about patient getting admitted, Nutritients through IV and getting scan of abd due to not eating any solid foods since before Christmas.   Made Santiago Glad PA aware of family's concerns and wanting to speak with her.

## 2017-09-15 ENCOUNTER — Inpatient Hospital Stay (HOSPITAL_COMMUNITY): Payer: BLUE CROSS/BLUE SHIELD

## 2017-09-15 ENCOUNTER — Ambulatory Visit: Payer: BLUE CROSS/BLUE SHIELD | Admitting: Hematology and Oncology

## 2017-09-15 ENCOUNTER — Encounter (HOSPITAL_COMMUNITY): Payer: Self-pay | Admitting: Surgery

## 2017-09-15 DIAGNOSIS — D72823 Leukemoid reaction: Secondary | ICD-10-CM | POA: Diagnosis not present

## 2017-09-15 DIAGNOSIS — R109 Unspecified abdominal pain: Secondary | ICD-10-CM | POA: Diagnosis not present

## 2017-09-15 DIAGNOSIS — Z931 Gastrostomy status: Secondary | ICD-10-CM | POA: Diagnosis not present

## 2017-09-15 DIAGNOSIS — D638 Anemia in other chronic diseases classified elsewhere: Secondary | ICD-10-CM | POA: Diagnosis present

## 2017-09-15 DIAGNOSIS — N131 Hydronephrosis with ureteral stricture, not elsewhere classified: Secondary | ICD-10-CM | POA: Diagnosis present

## 2017-09-15 DIAGNOSIS — N179 Acute kidney failure, unspecified: Secondary | ICD-10-CM | POA: Diagnosis not present

## 2017-09-15 DIAGNOSIS — N1330 Unspecified hydronephrosis: Secondary | ICD-10-CM | POA: Diagnosis not present

## 2017-09-15 DIAGNOSIS — K9189 Other postprocedural complications and disorders of digestive system: Secondary | ICD-10-CM | POA: Diagnosis present

## 2017-09-15 DIAGNOSIS — C801 Malignant (primary) neoplasm, unspecified: Secondary | ICD-10-CM | POA: Diagnosis not present

## 2017-09-15 DIAGNOSIS — K567 Ileus, unspecified: Secondary | ICD-10-CM | POA: Diagnosis not present

## 2017-09-15 DIAGNOSIS — K56609 Unspecified intestinal obstruction, unspecified as to partial versus complete obstruction: Secondary | ICD-10-CM | POA: Diagnosis not present

## 2017-09-15 DIAGNOSIS — R188 Other ascites: Secondary | ICD-10-CM | POA: Diagnosis present

## 2017-09-15 DIAGNOSIS — E860 Dehydration: Secondary | ICD-10-CM | POA: Diagnosis present

## 2017-09-15 DIAGNOSIS — E43 Unspecified severe protein-calorie malnutrition: Secondary | ICD-10-CM | POA: Diagnosis present

## 2017-09-15 DIAGNOSIS — Z515 Encounter for palliative care: Secondary | ICD-10-CM | POA: Diagnosis present

## 2017-09-15 DIAGNOSIS — Z5111 Encounter for antineoplastic chemotherapy: Secondary | ICD-10-CM | POA: Diagnosis not present

## 2017-09-15 DIAGNOSIS — Z801 Family history of malignant neoplasm of trachea, bronchus and lung: Secondary | ICD-10-CM | POA: Diagnosis not present

## 2017-09-15 DIAGNOSIS — Z87898 Personal history of other specified conditions: Secondary | ICD-10-CM | POA: Diagnosis not present

## 2017-09-15 DIAGNOSIS — N19 Unspecified kidney failure: Secondary | ICD-10-CM | POA: Diagnosis not present

## 2017-09-15 DIAGNOSIS — E8770 Fluid overload, unspecified: Secondary | ICD-10-CM | POA: Diagnosis present

## 2017-09-15 DIAGNOSIS — R112 Nausea with vomiting, unspecified: Secondary | ICD-10-CM | POA: Diagnosis present

## 2017-09-15 DIAGNOSIS — D72829 Elevated white blood cell count, unspecified: Secondary | ICD-10-CM | POA: Diagnosis present

## 2017-09-15 DIAGNOSIS — E872 Acidosis: Secondary | ICD-10-CM | POA: Diagnosis present

## 2017-09-15 DIAGNOSIS — Z803 Family history of malignant neoplasm of breast: Secondary | ICD-10-CM | POA: Diagnosis not present

## 2017-09-15 DIAGNOSIS — E876 Hypokalemia: Secondary | ICD-10-CM | POA: Diagnosis present

## 2017-09-15 DIAGNOSIS — R7989 Other specified abnormal findings of blood chemistry: Secondary | ICD-10-CM | POA: Diagnosis present

## 2017-09-15 DIAGNOSIS — C786 Secondary malignant neoplasm of retroperitoneum and peritoneum: Secondary | ICD-10-CM | POA: Diagnosis present

## 2017-09-15 DIAGNOSIS — C180 Malignant neoplasm of cecum: Secondary | ICD-10-CM | POA: Diagnosis present

## 2017-09-15 DIAGNOSIS — K56699 Other intestinal obstruction unspecified as to partial versus complete obstruction: Secondary | ICD-10-CM | POA: Diagnosis present

## 2017-09-15 DIAGNOSIS — Z7189 Other specified counseling: Secondary | ICD-10-CM | POA: Diagnosis not present

## 2017-09-15 DIAGNOSIS — C189 Malignant neoplasm of colon, unspecified: Secondary | ICD-10-CM | POA: Diagnosis not present

## 2017-09-15 DIAGNOSIS — K639 Disease of intestine, unspecified: Secondary | ICD-10-CM | POA: Diagnosis not present

## 2017-09-15 DIAGNOSIS — G40909 Epilepsy, unspecified, not intractable, without status epilepticus: Secondary | ICD-10-CM | POA: Diagnosis present

## 2017-09-15 DIAGNOSIS — J90 Pleural effusion, not elsewhere classified: Secondary | ICD-10-CM | POA: Diagnosis present

## 2017-09-15 DIAGNOSIS — K219 Gastro-esophageal reflux disease without esophagitis: Secondary | ICD-10-CM | POA: Diagnosis present

## 2017-09-15 LAB — COMPREHENSIVE METABOLIC PANEL
ALBUMIN: 2.4 g/dL — AB (ref 3.5–5.0)
ALK PHOS: 131 U/L — AB (ref 38–126)
ALT: 40 U/L (ref 14–54)
ANION GAP: 12 (ref 5–15)
AST: 50 U/L — ABNORMAL HIGH (ref 15–41)
BUN: 19 mg/dL (ref 6–20)
CO2: 21 mmol/L — AB (ref 22–32)
Calcium: 8.2 mg/dL — ABNORMAL LOW (ref 8.9–10.3)
Chloride: 102 mmol/L (ref 101–111)
Creatinine, Ser: 0.87 mg/dL (ref 0.44–1.00)
GFR calc Af Amer: >60 mL/min (ref >60)
GFR calc non Af Amer: >60 mL/min (ref >60)
GLUCOSE: 99 mg/dL (ref 65–99)
POTASSIUM: 4 mmol/L (ref 3.5–5.1)
Sodium: 135 mmol/L (ref 135–145)
Total Bilirubin: 1.3 mg/dL — ABNORMAL HIGH (ref 0.3–1.2)
Total Protein: 6.1 g/dL — ABNORMAL LOW (ref 6.5–8.1)

## 2017-09-15 LAB — CBC
HEMATOCRIT: 40.3 % (ref 36.0–46.0)
Hemoglobin: 13 g/dL (ref 12.0–15.0)
MCH: 28 pg (ref 26.0–34.0)
MCHC: 32.3 g/dL (ref 30.0–36.0)
MCV: 86.9 fL (ref 78.0–100.0)
PLATELETS: 402 10*3/uL — AB (ref 150–400)
RBC: 4.64 MIL/uL (ref 3.87–5.11)
RDW: 14.4 % (ref 11.5–15.5)
WBC: 8.7 10*3/uL (ref 4.0–10.5)

## 2017-09-15 LAB — LACTIC ACID, PLASMA: Lactic Acid, Venous: 0.9 mmol/L (ref 0.5–1.9)

## 2017-09-15 LAB — GLUCOSE, CAPILLARY: Glucose-Capillary: 91 mg/dL (ref 65–99)

## 2017-09-15 MED ORDER — MORPHINE SULFATE (PF) 4 MG/ML IV SOLN
1.0000 mg | INTRAVENOUS | Status: DC | PRN
  Administered 2017-09-15: 1 mg via INTRAVENOUS
  Filled 2017-09-15: qty 1

## 2017-09-15 NOTE — Progress Notes (Addendum)
NG tube placed with 150cc's of dark brown output when placed to low intermittent suction.  Per abdominal x-ray enteric tube projects over the left upper quadrant of the abdomen, likely in the gastric fundus.   Spoke with Dr. Marney Setting who recommended advancing tube 6 inches.  Tube advanced, pt now resting comfortably.

## 2017-09-15 NOTE — H&P (View-Only) (Signed)
[facility] CONSULT Reason for consult: Abdominal mass of unclear etiology Referring Physician: [facility].  PCP: Fabio Bering PA-C.  Primary GI: None  Christie Maynard is an 62 y.o. female.  HPI: Patient was admitted for Christmas with abdominal pain nausea and urinary obstruction with workup revealing pelvic mass and apparent peritoneal carcinomatosis.  Her CA-125 was suspicious for ovarian cancer.  She had obstruction and had a ureteral stent placed by urology.  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  Patient is continuing to have upper abdominal pain and has not had a bowel movement now for approximate 10 days other than a small amount of liquid.  She has had no rectal bleeding but has had some bleeding with urination.  She has not passed flatus now for 5 or 6 days.  She has become progressively distended and has had increased burping and belching.  She has had some emesis yesterday but none today.  She is tolerating oral intake very poorly. CT scan CT scan done yesterday showed marked dilation of fluid-filled small bowel with the distal small bowel and colon decompressed.  Some increased fluid in the stomach but not massively distended.Again, 4+ centimeter pelvic mass seen with diffuse infiltration throughout the omentum consistent with peritoneal metastasis.  Small amount of ascitic fluid seen in the abdomen and pelvis with no free air.  The interpretation was pelvic mass consistent with malignancy probably ovarian in origin with questionable invasion of the uterus and diffuse peritoneal carcinomatosis with progressive small bowel obstruction.  CEA is still pending. The patient notes that prior to recently she had fairly good bowel movements without any problem.  She has never had colonoscopy has no family history of colon cancer.  There  is a family history of breast cancer and lung cancer.  Past Medical History:  Diagnosis Date  . Cancer ([place])   . Ovarian ca ([place])   . Seizures ([place])   . UTI (urinary tract infection)     Past Surgical History:  Procedure Laterality Date  . CYSTOSCOPY W/ URETERAL STENT PLACEMENT Left 09/05/2017   Procedure: CYSTOSCOPY WITH LEFT URETERAL STENT REPLACEMENT;  Surgeon: Raynelle Bring, MD;  Location: [facility];  Service: Urology;  Laterality: Left;  . IR PARACENTESIS  09/13/2017  . NO PAST SURGERIES      Family History  Problem Relation Age of Onset  . Breast cancer Mother   . Lung cancer Father   . Breast cancer Sister   . Breast cancer Maternal Aunt   . Breast cancer Paternal Aunt   . Cancer Paternal Uncle     Social History:  reports that  has never smoked. she has never used smokeless tobacco. She reports that she does not drink alcohol or use drugs.  Allergies: No Known Allergies  Medications; Prior to Admission medications   Medication Sig Start Date End Date Taking? Authorizing Provider  ibuprofen (ADVIL,MOTRIN) 200 MG tablet Take 400 mg by mouth every 6 (six) hours as needed for headache.   Yes [provider]  ondansetron (ZOFRAN) 4 MG tablet Take 1 tablet (4 mg total) by mouth every 6 (six) hours as needed for nausea. 09/05/17  Yes Aline August, MD  phenytoin (DILANTIN) 100 MG ER capsule Take 100 mg by mouth daily. 01/18/17  Yes [provider]  traMADol (ULTRAM) 50 MG tablet Take 1 tablet (50 mg total) by mouth every  6 (six) hours as needed for moderate pain. 09/05/17 09/05/18 Yes Aline August, MD   . enoxaparin (LOVENOX) injection  40 mg Subcutaneous Q24H  . phenytoin (DILANTIN) IV  100 mg Intravenous QHS   PRN Meds ondansetron (ZOFRAN) IV, ondansetron **OR** ondansetron (ZOFRAN) IV Results for orders placed or performed during the hospital encounter of 09/14/17 (from the past 48 hour(s))  Lipase, blood     Status: Abnormal   Collection Time:  09/14/17 11:49 AM  Result Value Ref Range   Lipase 69 (H) 11 - 51 U/L  Comprehensive metabolic panel     Status: Abnormal   Collection Time: 09/14/17 11:49 AM  Result Value Ref Range   Sodium 132 (L) 135 - 145 mmol/L   Potassium 4.3 3.5 - 5.1 mmol/L   Chloride 96 (L) 101 - 111 mmol/L   CO2 19 (L) 22 - 32 mmol/L   Glucose, Bld 109 (H) 65 - 99 mg/dL   BUN 20 6 - 20 mg/dL   Creatinine, Ser 1.04 (H) 0.44 - 1.00 mg/dL   Calcium 9.3 8.9 - 10.3 mg/dL   Total Protein 7.4 6.5 - 8.1 g/dL   Albumin 3.1 (L) 3.5 - 5.0 g/dL   AST 66 (H) 15 - 41 U/L   ALT 51 14 - 54 U/L   Alkaline Phosphatase 162 (H) 38 - 126 U/L   Total Bilirubin 1.6 (H) 0.3 - 1.2 mg/dL   GFR calc non Af Amer 56 (L) >60 mL/min   GFR calc Af Amer >60 >60 mL/min    Comment: (NOTE) The eGFR has been calculated using the CKD EPI equation. This calculation has not been validated in all clinical situations. eGFR's persistently <60 mL/min signify possible Chronic Kidney Disease.    Anion gap 17 (H) 5 - 15  CBC     Status: Abnormal   Collection Time: 09/14/17 11:49 AM  Result Value Ref Range   WBC 14.2 (H) 4.0 - 10.5 K/uL   RBC 5.06 3.87 - 5.11 MIL/uL   Hemoglobin 14.7 12.0 - 15.0 g/dL   HCT 43.6 36.0 - 46.0 %   MCV 86.2 78.0 - 100.0 fL   MCH 29.1 26.0 - 34.0 pg   MCHC 33.7 30.0 - 36.0 g/dL   RDW 14.2 11.5 - 15.5 %   Platelets 446 (H) 150 - 400 K/uL  Urinalysis, Routine w reflex microscopic     Status: Abnormal   Collection Time: 09/14/17  4:33 PM  Result Value Ref Range   Color, Urine AMBER (A) YELLOW    Comment: BIOCHEMICALS MAY BE AFFECTED BY COLOR   APPearance CLOUDY (A) CLEAR   Specific Gravity, Urine 1.021 1.005 - 1.030   pH 5.0 5.0 - 8.0   Glucose, UA NEGATIVE NEGATIVE mg/dL   Hgb urine dipstick MODERATE (A) NEGATIVE   Bilirubin Urine NEGATIVE NEGATIVE   Ketones, ur 80 (A) NEGATIVE mg/dL   Protein, ur 100 (A) NEGATIVE mg/dL   Nitrite NEGATIVE NEGATIVE   Leukocytes, UA MODERATE (A) NEGATIVE   RBC / HPF TOO  NUMEROUS TO COUNT 0 - 5 RBC/hpf   WBC, UA TOO NUMEROUS TO COUNT 0 - 5 WBC/hpf   Bacteria, UA MANY (A) NONE SEEN   Squamous Epithelial / LPF 6-30 (A) NONE SEEN   Mucus PRESENT    Budding Yeast PRESENT    Hyaline Casts, UA PRESENT    Granular Casts, UA PRESENT    Amorphous Crystal PRESENT   Lactic acid, plasma     Status: None  Collection Time: 09/14/17  9:07 PM  Result Value Ref Range   Lactic Acid, Venous 1.6 0.5 - 1.9 mmol/L  Lactic acid, plasma     Status: None   Collection Time: 09/14/17 11:43 PM  Result Value Ref Range   Lactic Acid, Venous 0.9 0.5 - 1.9 mmol/L  Comprehensive metabolic panel     Status: Abnormal   Collection Time: 09/15/17  4:08 AM  Result Value Ref Range   Sodium 135 135 - 145 mmol/L   Potassium 4.0 3.5 - 5.1 mmol/L   Chloride 102 101 - 111 mmol/L   CO2 21 (L) 22 - 32 mmol/L   Glucose, Bld 99 65 - 99 mg/dL   BUN 19 6 - 20 mg/dL   Creatinine, Ser 0.87 0.44 - 1.00 mg/dL   Calcium 8.2 (L) 8.9 - 10.3 mg/dL   Total Protein 6.1 (L) 6.5 - 8.1 g/dL   Albumin 2.4 (L) 3.5 - 5.0 g/dL   AST 50 (H) 15 - 41 U/L   ALT 40 14 - 54 U/L   Alkaline Phosphatase 131 (H) 38 - 126 U/L   Total Bilirubin 1.3 (H) 0.3 - 1.2 mg/dL   GFR calc non Af Amer >60 >60 mL/min   GFR calc Af Amer >60 >60 mL/min    Comment: (NOTE) The eGFR has been calculated using the CKD EPI equation. This calculation has not been validated in all clinical situations. eGFR's persistently <60 mL/min signify possible Chronic Kidney Disease.    Anion gap 12 5 - 15  CBC     Status: Abnormal   Collection Time: 09/15/17  4:08 AM  Result Value Ref Range   WBC 8.7 4.0 - 10.5 K/uL   RBC 4.64 3.87 - 5.11 MIL/uL   Hemoglobin 13.0 12.0 - 15.0 g/dL   HCT 40.3 36.0 - 46.0 %   MCV 86.9 78.0 - 100.0 fL   MCH 28.0 26.0 - 34.0 pg   MCHC 32.3 30.0 - 36.0 g/dL   RDW 14.4 11.5 - 15.5 %   Platelets 402 (H) 150 - 400 K/uL  Glucose, capillary     Status: None   Collection Time: 09/15/17  7:49 AM  Result Value Ref  Range   Glucose-Capillary 91 65 - 99 mg/dL    Ct Abdomen Pelvis W Contrast  Result Date: 09/14/2017 CLINICAL DATA:  Abdominal swelling and vomiting. Increasing weakness. Patient is recently been diagnosed with cervical cancer. Paracentesis 1 day ago and last week. EXAM: CT ABDOMEN AND PELVIS WITH CONTRAST TECHNIQUE: Multidetector CT imaging of the abdomen and pelvis was performed using the standard protocol following bolus administration of intravenous contrast. CONTRAST:  35m ISOVUE-300 IOPAMIDOL (ISOVUE-300) INJECTION 61% COMPARISON:  09/03/2017 FINDINGS: Lower chest: Small bilateral pleural effusions, greater on the right. Mild basilar atelectasis is likely compressive. Moderate-sized esophageal hiatal hernia. Hepatobiliary: 1 cm diameter hypo dense lesion in segment 4 of the liver may represent a metastatic focus. No other focal liver lesions identified. Gallbladder and bile ducts are unremarkable. Pancreas: Pancreas is atrophic. No focal lesion or inflammatory changes appreciated. Spleen: Spleen size is normal.  No focal lesions. Adrenals/Urinary Tract: Right adrenal gland nodule measuring 2.2 cm in diameter. Hounsfield unit measurements on portal venous and delayed phase imaging demonstrate a relative washout of 55%. Relative washout of 40% or greater suggests benign adenoma. Renal nephrograms are homogeneous. Mildly dilated left intrarenal collecting system. A left ureteral stent is in place. Bladder is unremarkable. Stomach/Bowel: Stomach is not abnormally distended. There is diffuse dilatation of  fluid-filled small bowel with decompressed terminal ileum. This is progressing since the previous study. Transition zone appears to be in the right lower quadrant. Appearance is consistent with small bowel obstruction. Cause of obstruction may be extrinsic compression of the small bowel, metastasis, or direct invasion. Colon is decompressed. Small amount of residual contrast material in the colon. Appendix is  normal. Vascular/Lymphatic: No significant vascular findings are present. No enlarged abdominal or pelvic lymph nodes. Reproductive: Large heterogeneous pelvic mass extending above the uterus. This likely arises from the right ovary and appears to demonstrate direct invasion of the uterine fundus on the left. The mass measures about 6.7 by 10.2 x 10.7 cm. Other: Right lower quadrant mesenteric mass measuring 4.4 cm in diameter. Diffuse nodular infiltration throughout the omentum and mesenteric fat consistent with diffuse peritoneal metastasis. Small amount of free fluid throughout the abdomen and pelvis. No free air. Abdominal wall musculature appears intact. Musculoskeletal: Mild degenerative changes in the spine. Heterogeneous appearance of the proximal left femur with trabecular coarsening. Appearance is most consistent with Paget's disease although metastatic lesion could also have this appearance. IMPRESSION: 1. Large heterogeneous pelvic mass lesion consistent with malignancy, likely ovarian in origin. Probable direct invasion of the uterus. 2. Diffuse peritoneal carcinomatosis with nodular infiltration of the omentum and mesentery and diffuse peritoneal fluid. Right lower quadrant mass measuring 4.4 cm diameter, likely metastatic. 3. Progressing small bowel obstruction with diffusely dilated fluid-filled small bowel. Transition zone is in the right lower quadrant. 4. Right adrenal gland nodule has characteristics suggesting a benign adenoma. 5. Bone changes in the proximal left femur probably represent Paget's disease although bone metastasis is not excluded. Consider bone scan for further evaluation if clinically indicated. 6. Left 3 atrial stent in place with mild residual hydronephrosis. 7. 1 cm low-attenuation lesion in the left lobe of the liver suspicious for metastasis. Electronically Signed   By: Lucienne Capers M.D.   On: 09/14/2017 19:29   Dg Abd Acute W/chest  Result Date: 09/14/2017 CLINICAL  DATA:  Vomiting. EXAM: DG ABDOMEN ACUTE W/ 1V CHEST COMPARISON:  08/26/2017 FINDINGS: Left-sided nephroureteral stent is identified. Small bowel air-fluid levels are identified. Small bowel loops are mildly increased in caliber measuring up to 2.6 cm. There is no evidence of free intraperitoneal air. Heart size and mediastinal contours are within normal limits. There are bilateral pleural effusions noted right greater than left. Both lungs are clear. IMPRESSION: 1. Mildly increased caliber of small bowel loops with air-fluid levels. Cannot rule out low grade partial obstruction. 2. Status post left ureteral stenting. 3. Bilateral pleural effusions. Electronically Signed   By: Kerby Moors M.D.   On: 09/14/2017 16:58   ROS: Constitutional: As above HEENT: Negative Cardiovascular: Negative Respiratory: Non-smoker no history of pulmonary infections GI: As above GU: As above Musculoskeletal: Negative Neuro/Psychiatric: Negative Endocrine/Heme: No diabetes or other endocrine problems            Blood pressure 122/84, pulse 92, temperature 97.8 F (36.6 C), temperature source Oral, resp. rate 18, height _0  (1.575 m), weight 63.4 kg (139 lb 12.8 oz), SpO2 98 %.  Physical exam:   General--Pleasant white female in no distress ENT--nonicteric, mouth is normal mucous membranes moist Neck--full range of motion with no lymphadenopathy Heart--regular rate and rhythm without murmurs or gallops Lungs--clear Abdomen--abdomen is fairly distended and somewhat tympanic but generally nontender.  Bowel sounds appear to be normal Psych--normal mood and affect.  Alert and oriented answers questions appropriately   Assessment: 1.  Small bowel obstruction.  This appears to be due to some type of tumor in the pelvis probably ovarian primary still unclear.  1 of the paracentesis suggest possible adenocarcinoma of the GI origin.  Plan: We will await on CEA level.  Given the marked dilatation and  fluid-filled small bowel I would suggest going ahead and placing an NG tube and trying to decompress her.  If we do need to perform EGD, but would not feel comfortable with small bowel and stomach full of fluid.  This would potentially increase her risk of aspiration.  If colonoscopy or colon evaluation is needed would likely need to prep with enemas or do a barium enema.  We will await oncology's evaluation and the CEA level.   Nancy Fetter 09/15/2017, 2:53 PM   This note was created using voice recognition software and minor errors may Have occurred unintentionally. Pager: [phone] If no answer or after hours call [phone]

## 2017-09-15 NOTE — Progress Notes (Signed)
Initial Nutrition Assessment  DOCUMENTATION CODES:   Not applicable  INTERVENTION:   Monitor for diet advancement, provide medically appropriate nutrition supplement   NUTRITION DIAGNOSIS:   Increased nutrient needs related to chronic illness, cancer and cancer related treatments, catabolic illness as evidenced by estimated needs  GOAL:   Patient will meet greater than or equal to 90% of their needs  MONITOR:   PO intake, Supplement acceptance, Weight trends, I & O's, Labs  REASON FOR ASSESSMENT:   Malnutrition Screening Tool    ASSESSMENT:   Pt with PMH of seizures and recently diagnosed peritoneal carcinomatosis with bowel obstruction/colonic obstruction secondary to malignancy of unknown primary presents with N/V, admitted for further management/evaluation.    Pt endorses a poor appetite for the past month, since December, reporting she had a good appetite on Thanksgiving. Reporting she has unable to eat solid foods since the "Thursday before Christmas." Pt reports since then she has only been able to consume a liquid diet of puddings, Boost, juices, etc. However the past two days she has thrown up anything she has consumed. Pt currently NPO.  Pt reports a decline in weight, reporting a UBW of 150 lbs. Chart reveals 16 lb weight loss in < 2 weeks, however pt underwent paracentesis 12/22 and 12/31, 4.8 L and 2 L removed respectively.  Pt is at risk for malnutrition if intake continues to be poor and weight continues to decline.   Labs and medications reviewed; Lipase 69, Albumin 2.4   NUTRITION - FOCUSED PHYSICAL EXAM:    Most Recent Value  Orbital Region  Moderate depletion  Upper Arm Region  No depletion  Thoracic and Lumbar Region  Unable to assess  Buccal Region  No depletion  Temple Region  Moderate depletion  Clavicle Bone Region  Moderate depletion  Clavicle and Acromion Bone Region  Moderate depletion  Scapular Bone Region  Unable to assess  Dorsal Hand   Unable to assess  Patellar Region  No depletion  Anterior Thigh Region  No depletion  Posterior Calf Region  No depletion  Edema (RD Assessment)  Unable to assess      Exam limited given ascites   Diet Order:  No diet orders on file  EDUCATION NEEDS:   Not appropriate for education at this time  Skin:  Skin Assessment: Reviewed RN Assessment  Last BM:  09/03/17  Height:   Ht Readings from Last 1 Encounters:  09/14/17 5\' 2"  (1.575 m)    Weight:   Wt Readings from Last 1 Encounters:  09/14/17 139 lb 12.8 oz (63.4 kg)    Ideal Body Weight:  50 kg  BMI:  Body mass index is 25.57 kg/m.  Estimated Nutritional Needs:   Kcal:  1700-1900  Protein:  80-90 grams  Fluid:  >/= 1.7 L/d  Parks Ranger, MS, RDN, LDN 09/15/2017 3:07 PM

## 2017-09-15 NOTE — Progress Notes (Addendum)
Patient ID: Christie Maynard, female   DOB: 15-Mar-1956, 62 y.o.   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 Patient reports poor oral intake and unable to eat solid foods for past few weeks but 24 hours prior to this admission, she felt a lot worse, bloated, uncomfortable. Off note patient had appointment with gastroenterologist scheduled for 09/15/2017 due to cytology results concerning for adenocarcinoma.    Assessment and plan:  Principal Problem:   Nausea & vomiting, small bowel obstruction secondary to malignancy, unknown primary - Patient reports feeling somewhat better, has not had vomiting since admission - We'll keep nothing by mouth for now, allowing ice chips - In case of vomiting, plan to place NG tube - Continue IV fluids, antiemetics as needed - GI consultation requested by equals physicians   Active Problems:   Ascites, Peritoneal carcinomatosis - Patient follows with Dr. 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 Denman George of patient's admission - GI also consulted as noted above for further evaluation of primary tumor - CEA requested  - pt underwent paracentesis on 12/31, 2 L fluid removed - atypical cells present - pt also underwent paracentesis on 12/22, 4.8 L fluid removed - atypical cells noted suspicious for adenocarcinoma of GI tract     Anion gap metabolic acidosis - Improved with IV fluids    Leukocytosis - Suspect reactive process from obstruction - No clear evidence of an infectious etiology - Resolved    Left ureteral  obstruction, status post stent placement - Creatinine is within normal limits    Mild transaminitis - Monitor while inpatient    History of seizures - Has been on Dilantin  DVT prophylaxis: Lovenox SQ Code Status: Full  Family Communication: Patient and family at bedside  Disposition Plan: to be determined   Consultants:   Surgery  GI   Procedures:   None  Antimicrobials:   None  Subjective: Pt reports feeling better, no nausea or vomiting this AM.   Objective: Vitals:   09/14/17 1830 09/14/17 1942 09/14/17 2110 09/15/17 0542  BP: 126/78 123/83 126/82 133/81  Pulse: 99 95 100 (!) 104  Resp:  18 18 18   Temp:   98 F (36.7 C) 98.2 F (36.8 C)  TempSrc:   Oral Oral  SpO2: 98% 98% 98% 97%  Weight:   63.4 kg (139 lb 12.8 oz)   Height:   5\' 2"  (1.575 m)     Intake/Output Summary (Last 24 hours) at 09/15/2017 1155 Last data filed at 09/15/2017 0800 Gross per 24 hour  Intake 2018.74 ml  Output -  Net 2018.74 ml   Filed Weights   09/14/17 1127 09/14/17 2110  Weight: 68 kg (150 lb) 63.4 kg (139 lb 12.8 oz)    Examination:  General exam: Appears calm and comfortable  Respiratory system: Clear to auscultation. Respiratory effort normal. Cardiovascular system: S1 & S2 heard, RRR. No JVD, murmurs, rubs, gallops or clicks. No pedal edema. Gastrointestinal system: Abdomen is distended, tender in epigastric area, no rebound or guarding Central nervous system: Alert and oriented. No focal neurological deficits. Extremities: Symmetric  5 x 5 power. Skin: No rashes, lesions or ulcers Psychiatry: Judgement and insight appear normal. Mood & affect appropriate.   Data Reviewed: I have personally reviewed following labs and imaging studies  CBC: Recent Labs  Lab 09/14/17 1149 09/15/17 0408  WBC 14.2* 8.7  HGB 14.7 13.0  HCT 43.6 40.3  MCV 86.2 86.9  PLT 446* 179*   Basic Metabolic Panel: Recent Labs  Lab 09/14/17 1149 09/15/17 0408  NA 132* 135  K 4.3 4.0    CL 96* 102  CO2 19* 21*  GLUCOSE 109* 99  BUN 20 19  CREATININE 1.04* 0.87  CALCIUM 9.3 8.2*   Liver Function Tests: Recent Labs  Lab 09/14/17 1149 09/15/17 0408  AST 66* 50*  ALT 51 40  ALKPHOS 162* 131*  BILITOT 1.6* 1.3*  PROT 7.4 6.1*  ALBUMIN 3.1* 2.4*   Recent Labs  Lab 09/14/17 1149  LIPASE 69*   CBG: Recent Labs  Lab 09/15/17 0749  GLUCAP 91   Urine analysis:    Component Value Date/Time   COLORURINE AMBER (A) 09/14/2017 1633   APPEARANCEUR CLOUDY (A) 09/14/2017 1633   LABSPEC 1.021 09/14/2017 1633   PHURINE 5.0 09/14/2017 1633   GLUCOSEU NEGATIVE 09/14/2017 1633   HGBUR MODERATE (A) 09/14/2017 1633   BILIRUBINUR NEGATIVE 09/14/2017 1633   KETONESUR 80 (A) 09/14/2017 1633   PROTEINUR 100 (A) 09/14/2017 1633   NITRITE NEGATIVE 09/14/2017 1633   LEUKOCYTESUR MODERATE (A) 09/14/2017 1633    Radiology Studies: Ct Abdomen Pelvis W Contrast Result Date: 09/14/2017 Large heterogeneous pelvic mass lesion consistent with malignancy, likely ovarian in origin. Probable direct invasion of the uterus. 2. Diffuse peritoneal carcinomatosis with nodular infiltration of the omentum and mesentery and diffuse peritoneal fluid. Right lower quadrant mass measuring 4.4 cm diameter, likely metastatic. 3. Progressing small bowel obstruction with diffusely dilated fluid-filled small bowel. Transition zone is in the right lower quadrant. 4. Right adrenal gland nodule has characteristics suggesting a benign adenoma. 5. Bone changes in the proximal left femur probably represent Paget's disease although bone metastasis is not excluded. Consider bone scan for further evaluation if clinically indicated. 6. Left 3 atrial stent in place with mild residual hydronephrosis. 7. 1 cm low-attenuation lesion in the left lobe of the liver suspicious for metastasis  Dg Abd Acute W/chest Result Date: 09/14/2017 Mildly increased caliber of small bowel loops with air-fluid levels. Cannot rule out low  grade partial obstruction. 2. Status post left ureteral stenting. 3. Bilateral pleural effusions.   Ir Paracentesis Result Date: 09/13/2017 Successful ultrasound-guided paracentesis yielding 2.0 liters of peritoneal fluid.  Scheduled Meds: . enoxaparin (LOVENOX) injection  40 mg Subcutaneous Q24H  . phenytoin (DILANTIN) IV  100 mg Intravenous QHS   Continuous Infusions: . sodium chloride 125 mL/hr at 09/15/17 0919     LOS: 0 days   Time spent: 25 minutes   Faye Ramsay, MD [facility] Pager [phone]  If 7PM-7AM, please contact night-coverage [email or web address] Password [facility] 09/15/2017, 11:55 AM

## 2017-09-15 NOTE — Progress Notes (Signed)
Spoke with representative at [facility] to inform Dr. Therisa Doyne that Mrs. Tavenner had been admitted and would not be coming to her appointment this am.  It was originally thought she may have Stage IIIC ovarian cancer.  Based on cytology from paracentesis, patient needs to have GI evaluation for suspected GI malignancy per Dr. Denman George.  Recommendations were for upper endoscopy and colonoscopy.  Cytology pending from second paracentesis as well to see if definitive diagnosis could be obtained.  Appointment with Dr. Alvy Bimler had been cancelled due to suspicion for GI malignancy instead of GYN and appt with Dr. Benay Spice was going to be arranged outpatient.  Will continue to follow.

## 2017-09-15 NOTE — Evaluation (Signed)
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 All education has been completed and the patient has no further questions.  Pt ambulated in hallway with slow but steady pace.  Pt encouraged to ambulate 3-4 times a day with spouse or staff. No follow-up Physical Therapy or equipment needs identified at this time. PT is signing off. Thank you for this referral.     Follow Up Recommendations No PT follow up    Equipment Recommendations  None recommended by PT    Recommendations for Other Services       Precautions / Restrictions Precautions Precautions: None      Mobility  Bed Mobility Overal bed mobility: Modified Independent                Transfers Overall transfer level: Modified independent                  Ambulation/Gait Ambulation/Gait assistance: Supervision Ambulation Distance (Feet): 240 Feet Assistive device: None Gait Pattern/deviations: Step-through pattern     General Gait Details: slow but steady pace, HR 120 bpm with ambulation - RN notified  Stairs            Wheelchair Mobility    Modified Rankin (Stroke Patients Only)       Balance Overall balance assessment: No apparent balance deficits (not formally assessed)(denies falls)                                           Pertinent Vitals/Pain Pain Assessment: No/denies pain    Home Living Family/patient expects  to be discharged to:: Private residence Living Arrangements: Spouse/significant other   Type of Home: House Home Access: Stairs to enter   Technical brewer of Steps: 4 Home Layout: One level Home Equipment: None      Prior Function Level of Independence: Independent               Hand Dominance        Extremity/Trunk Assessment        Lower Extremity Assessment Lower Extremity Assessment: Overall WFL for tasks assessed    Cervical / Trunk Assessment Cervical / Trunk Assessment: Normal  Communication   Communication: No difficulties  Cognition Arousal/Alertness: Awake/alert Behavior During Therapy: WFL for tasks assessed/performed Overall Cognitive Status: Within Functional Limits for tasks assessed                                        General Comments      Exercises     Assessment/Plan    PT Assessment Patent does not need any further PT services  PT Problem List         PT Treatment Interventions      PT Goals (Current goals can be found in the Care Plan section)  Acute Rehab PT Goals PT Goal  Formulation: All assessment and education complete, DC therapy    Frequency     Barriers to discharge        Co-evaluation               AM-PAC PT "6 Clicks" Daily Activity  Outcome Measure Difficulty turning over in bed (including adjusting bedclothes, sheets and blankets)?: None Difficulty moving from lying on back to sitting on the side of the bed? : None Difficulty sitting down on and standing up from a chair with arms (e.g., wheelchair, bedside commode, etc,.)?: None Help needed moving to and from a bed to chair (including a wheelchair)?: None Help needed walking in hospital room?: None Help needed climbing 3-5 steps with a railing? : None 6 Click Score: 24    End of Session   Activity Tolerance: Patient tolerated treatment well Patient left: in bed;with call bell/phone within reach;with family/visitor  present Nurse Communication: Mobility status PT Visit Diagnosis: Difficulty in walking, not elsewhere classified (R26.2)    Time: 9604-5409 PT Time Calculation (min) (ACUTE ONLY): 14 min   Charges:   PT Evaluation $PT Eval Low Complexity: 1 Low     PT G CodesCarmelia Bake, PT, DPT 09/15/2017 Pager: [phone]  York Ram E 09/15/2017, 3:16 PM

## 2017-09-15 NOTE — Consult Note (Signed)
[facility] CONSULT Reason for consult: Abdominal mass of unclear etiology Referring Physician: [facility] hospitalist.  PCP: Fabio Bering PA-C.  Primary GI: None  Christie Maynard is an 62 y.o. female.  HPI: Patient was admitted for Christmas with abdominal pain nausea and urinary obstruction with workup revealing pelvic mass and apparent peritoneal carcinomatosis.  Her CA-125 was suspicious for ovarian cancer.  She had obstruction and had a ureteral stent placed by urology.  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  Patient is continuing to have upper abdominal pain and has not had a bowel movement now for approximate 10 days other than a small amount of liquid.  She has had no rectal bleeding but has had some bleeding with urination.  She has not passed flatus now for 5 or 6 days.  She has become progressively distended and has had increased burping and belching.  She has had some emesis yesterday but none today.  She is tolerating oral intake very poorly. CT scan CT scan done yesterday showed marked dilation of fluid-filled small bowel with the distal small bowel and colon decompressed.  Some increased fluid in the stomach but not massively distended.Again, 4+ centimeter pelvic mass seen with diffuse infiltration throughout the omentum consistent with peritoneal metastasis.  Small amount of ascitic fluid seen in the abdomen and pelvis with no free air.  The interpretation was pelvic mass consistent with malignancy probably ovarian in origin with questionable invasion of the uterus and diffuse peritoneal carcinomatosis with progressive small bowel obstruction.  CEA is still pending. The patient notes that prior to recently she had fairly good bowel movements without any problem.  She has never had colonoscopy has no family history of colon cancer.  There  is a family history of breast cancer and lung cancer.  Past Medical History:  Diagnosis Date  . Cancer ([place])   . Ovarian ca ([place])   . Seizures ([place])   . UTI (urinary tract infection)     Past Surgical History:  Procedure Laterality Date  . CYSTOSCOPY W/ URETERAL STENT PLACEMENT Left 09/05/2017   Procedure: CYSTOSCOPY WITH LEFT URETERAL STENT REPLACEMENT;  Surgeon: Raynelle Bring, MD;  Location: [facility];  Service: Urology;  Laterality: Left;  . IR PARACENTESIS  09/13/2017  . NO PAST SURGERIES      Family History  Problem Relation Age of Onset  . Breast cancer Mother   . Lung cancer Father   . Breast cancer Sister   . Breast cancer Maternal Aunt   . Breast cancer Paternal Aunt   . Cancer Paternal Uncle     Social History:  reports that  has never smoked. she has never used smokeless tobacco. She reports that she does not drink alcohol or use drugs.  Allergies: No Known Allergies  Medications; Prior to Admission medications   Medication Sig Start Date End Date Taking? Authorizing Provider  ibuprofen (ADVIL,MOTRIN) 200 MG tablet Take 400 mg by mouth every 6 (six) hours as needed for headache.   Yes [provider]  ondansetron (ZOFRAN) 4 MG tablet Take 1 tablet (4 mg total) by mouth every 6 (six) hours as needed for nausea. 09/05/17  Yes Aline August, MD  phenytoin (DILANTIN) 100 MG ER capsule Take 100 mg by mouth daily. 01/18/17  Yes [provider]  traMADol (ULTRAM) 50 MG tablet Take 1 tablet (50 mg total) by mouth every  6 (six) hours as needed for moderate pain. 09/05/17 09/05/18 Yes Aline August, MD   . enoxaparin (LOVENOX) injection  40 mg Subcutaneous Q24H  . phenytoin (DILANTIN) IV  100 mg Intravenous QHS   PRN Meds ondansetron (ZOFRAN) IV, ondansetron **OR** ondansetron (ZOFRAN) IV Results for orders placed or performed during the hospital encounter of 09/14/17 (from the past 48 hour(s))  Lipase, blood     Status: Abnormal   Collection Time:  09/14/17 11:49 AM  Result Value Ref Range   Lipase 69 (H) 11 - 51 U/L  Comprehensive metabolic panel     Status: Abnormal   Collection Time: 09/14/17 11:49 AM  Result Value Ref Range   Sodium 132 (L) 135 - 145 mmol/L   Potassium 4.3 3.5 - 5.1 mmol/L   Chloride 96 (L) 101 - 111 mmol/L   CO2 19 (L) 22 - 32 mmol/L   Glucose, Bld 109 (H) 65 - 99 mg/dL   BUN 20 6 - 20 mg/dL   Creatinine, Ser 1.04 (H) 0.44 - 1.00 mg/dL   Calcium 9.3 8.9 - 10.3 mg/dL   Total Protein 7.4 6.5 - 8.1 g/dL   Albumin 3.1 (L) 3.5 - 5.0 g/dL   AST 66 (H) 15 - 41 U/L   ALT 51 14 - 54 U/L   Alkaline Phosphatase 162 (H) 38 - 126 U/L   Total Bilirubin 1.6 (H) 0.3 - 1.2 mg/dL   GFR calc non Af Amer 56 (L) >60 mL/min   GFR calc Af Amer >60 >60 mL/min    Comment: (NOTE) The eGFR has been calculated using the CKD EPI equation. This calculation has not been validated in all clinical situations. eGFR's persistently <60 mL/min signify possible Chronic Kidney Disease.    Anion gap 17 (H) 5 - 15  CBC     Status: Abnormal   Collection Time: 09/14/17 11:49 AM  Result Value Ref Range   WBC 14.2 (H) 4.0 - 10.5 K/uL   RBC 5.06 3.87 - 5.11 MIL/uL   Hemoglobin 14.7 12.0 - 15.0 g/dL   HCT 43.6 36.0 - 46.0 %   MCV 86.2 78.0 - 100.0 fL   MCH 29.1 26.0 - 34.0 pg   MCHC 33.7 30.0 - 36.0 g/dL   RDW 14.2 11.5 - 15.5 %   Platelets 446 (H) 150 - 400 K/uL  Urinalysis, Routine w reflex microscopic     Status: Abnormal   Collection Time: 09/14/17  4:33 PM  Result Value Ref Range   Color, Urine AMBER (A) YELLOW    Comment: BIOCHEMICALS MAY BE AFFECTED BY COLOR   APPearance CLOUDY (A) CLEAR   Specific Gravity, Urine 1.021 1.005 - 1.030   pH 5.0 5.0 - 8.0   Glucose, UA NEGATIVE NEGATIVE mg/dL   Hgb urine dipstick MODERATE (A) NEGATIVE   Bilirubin Urine NEGATIVE NEGATIVE   Ketones, ur 80 (A) NEGATIVE mg/dL   Protein, ur 100 (A) NEGATIVE mg/dL   Nitrite NEGATIVE NEGATIVE   Leukocytes, UA MODERATE (A) NEGATIVE   RBC / HPF TOO  NUMEROUS TO COUNT 0 - 5 RBC/hpf   WBC, UA TOO NUMEROUS TO COUNT 0 - 5 WBC/hpf   Bacteria, UA MANY (A) NONE SEEN   Squamous Epithelial / LPF 6-30 (A) NONE SEEN   Mucus PRESENT    Budding Yeast PRESENT    Hyaline Casts, UA PRESENT    Granular Casts, UA PRESENT    Amorphous Crystal PRESENT   Lactic acid, plasma     Status: None  Collection Time: 09/14/17  9:07 PM  Result Value Ref Range   Lactic Acid, Venous 1.6 0.5 - 1.9 mmol/L  Lactic acid, plasma     Status: None   Collection Time: 09/14/17 11:43 PM  Result Value Ref Range   Lactic Acid, Venous 0.9 0.5 - 1.9 mmol/L  Comprehensive metabolic panel     Status: Abnormal   Collection Time: 09/15/17  4:08 AM  Result Value Ref Range   Sodium 135 135 - 145 mmol/L   Potassium 4.0 3.5 - 5.1 mmol/L   Chloride 102 101 - 111 mmol/L   CO2 21 (L) 22 - 32 mmol/L   Glucose, Bld 99 65 - 99 mg/dL   BUN 19 6 - 20 mg/dL   Creatinine, Ser 0.87 0.44 - 1.00 mg/dL   Calcium 8.2 (L) 8.9 - 10.3 mg/dL   Total Protein 6.1 (L) 6.5 - 8.1 g/dL   Albumin 2.4 (L) 3.5 - 5.0 g/dL   AST 50 (H) 15 - 41 U/L   ALT 40 14 - 54 U/L   Alkaline Phosphatase 131 (H) 38 - 126 U/L   Total Bilirubin 1.3 (H) 0.3 - 1.2 mg/dL   GFR calc non Af Amer >60 >60 mL/min   GFR calc Af Amer >60 >60 mL/min    Comment: (NOTE) The eGFR has been calculated using the CKD EPI equation. This calculation has not been validated in all clinical situations. eGFR's persistently <60 mL/min signify possible Chronic Kidney Disease.    Anion gap 12 5 - 15  CBC     Status: Abnormal   Collection Time: 09/15/17  4:08 AM  Result Value Ref Range   WBC 8.7 4.0 - 10.5 K/uL   RBC 4.64 3.87 - 5.11 MIL/uL   Hemoglobin 13.0 12.0 - 15.0 g/dL   HCT 40.3 36.0 - 46.0 %   MCV 86.9 78.0 - 100.0 fL   MCH 28.0 26.0 - 34.0 pg   MCHC 32.3 30.0 - 36.0 g/dL   RDW 14.4 11.5 - 15.5 %   Platelets 402 (H) 150 - 400 K/uL  Glucose, capillary     Status: None   Collection Time: 09/15/17  7:49 AM  Result Value Ref  Range   Glucose-Capillary 91 65 - 99 mg/dL    Ct Abdomen Pelvis W Contrast  Result Date: 09/14/2017 CLINICAL DATA:  Abdominal swelling and vomiting. Increasing weakness. Patient is recently been diagnosed with cervical cancer. Paracentesis 1 day ago and last week. EXAM: CT ABDOMEN AND PELVIS WITH CONTRAST TECHNIQUE: Multidetector CT imaging of the abdomen and pelvis was performed using the standard protocol following bolus administration of intravenous contrast. CONTRAST:  35m ISOVUE-300 IOPAMIDOL (ISOVUE-300) INJECTION 61% COMPARISON:  09/03/2017 FINDINGS: Lower chest: Small bilateral pleural effusions, greater on the right. Mild basilar atelectasis is likely compressive. Moderate-sized esophageal hiatal hernia. Hepatobiliary: 1 cm diameter hypo dense lesion in segment 4 of the liver may represent a metastatic focus. No other focal liver lesions identified. Gallbladder and bile ducts are unremarkable. Pancreas: Pancreas is atrophic. No focal lesion or inflammatory changes appreciated. Spleen: Spleen size is normal.  No focal lesions. Adrenals/Urinary Tract: Right adrenal gland nodule measuring 2.2 cm in diameter. Hounsfield unit measurements on portal venous and delayed phase imaging demonstrate a relative washout of 55%. Relative washout of 40% or greater suggests benign adenoma. Renal nephrograms are homogeneous. Mildly dilated left intrarenal collecting system. A left ureteral stent is in place. Bladder is unremarkable. Stomach/Bowel: Stomach is not abnormally distended. There is diffuse dilatation of  fluid-filled small bowel with decompressed terminal ileum. This is progressing since the previous study. Transition zone appears to be in the right lower quadrant. Appearance is consistent with small bowel obstruction. Cause of obstruction may be extrinsic compression of the small bowel, metastasis, or direct invasion. Colon is decompressed. Small amount of residual contrast material in the colon. Appendix is  normal. Vascular/Lymphatic: No significant vascular findings are present. No enlarged abdominal or pelvic lymph nodes. Reproductive: Large heterogeneous pelvic mass extending above the uterus. This likely arises from the right ovary and appears to demonstrate direct invasion of the uterine fundus on the left. The mass measures about 6.7 by 10.2 x 10.7 cm. Other: Right lower quadrant mesenteric mass measuring 4.4 cm in diameter. Diffuse nodular infiltration throughout the omentum and mesenteric fat consistent with diffuse peritoneal metastasis. Small amount of free fluid throughout the abdomen and pelvis. No free air. Abdominal wall musculature appears intact. Musculoskeletal: Mild degenerative changes in the spine. Heterogeneous appearance of the proximal left femur with trabecular coarsening. Appearance is most consistent with Paget's disease although metastatic lesion could also have this appearance. IMPRESSION: 1. Large heterogeneous pelvic mass lesion consistent with malignancy, likely ovarian in origin. Probable direct invasion of the uterus. 2. Diffuse peritoneal carcinomatosis with nodular infiltration of the omentum and mesentery and diffuse peritoneal fluid. Right lower quadrant mass measuring 4.4 cm diameter, likely metastatic. 3. Progressing small bowel obstruction with diffusely dilated fluid-filled small bowel. Transition zone is in the right lower quadrant. 4. Right adrenal gland nodule has characteristics suggesting a benign adenoma. 5. Bone changes in the proximal left femur probably represent Paget's disease although bone metastasis is not excluded. Consider bone scan for further evaluation if clinically indicated. 6. Left 3 atrial stent in place with mild residual hydronephrosis. 7. 1 cm low-attenuation lesion in the left lobe of the liver suspicious for metastasis. Electronically Signed   By: Lucienne Capers M.D.   On: 09/14/2017 19:29   Dg Abd Acute W/chest  Result Date: 09/14/2017 CLINICAL  DATA:  Vomiting. EXAM: DG ABDOMEN ACUTE W/ 1V CHEST COMPARISON:  08/26/2017 FINDINGS: Left-sided nephroureteral stent is identified. Small bowel air-fluid levels are identified. Small bowel loops are mildly increased in caliber measuring up to 2.6 cm. There is no evidence of free intraperitoneal air. Heart size and mediastinal contours are within normal limits. There are bilateral pleural effusions noted right greater than left. Both lungs are clear. IMPRESSION: 1. Mildly increased caliber of small bowel loops with air-fluid levels. Cannot rule out low grade partial obstruction. 2. Status post left ureteral stenting. 3. Bilateral pleural effusions. Electronically Signed   By: Kerby Moors M.D.   On: 09/14/2017 16:58   ROS: Constitutional: As above HEENT: Negative Cardiovascular: Negative Respiratory: Non-smoker no history of pulmonary infections GI: As above GU: As above Musculoskeletal: Negative Neuro/Psychiatric: Negative Endocrine/Heme: No diabetes or other endocrine problems            Blood pressure 122/84, pulse 92, temperature 97.8 F (36.6 C), temperature source Oral, resp. rate 18, height _0  (1.575 m), weight 63.4 kg (139 lb 12.8 oz), SpO2 98 %.  Physical exam:   General--Pleasant white female in no distress ENT--nonicteric, mouth is normal mucous membranes moist Neck--full range of motion with no lymphadenopathy Heart--regular rate and rhythm without murmurs or gallops Lungs--clear Abdomen--abdomen is fairly distended and somewhat tympanic but generally nontender.  Bowel sounds appear to be normal Psych--normal mood and affect.  Alert and oriented answers questions appropriately   Assessment: 1.  Small bowel obstruction.  This appears to be due to some type of tumor in the pelvis probably ovarian primary still unclear.  1 of the paracentesis suggest possible adenocarcinoma of the GI origin.  Plan: We will await on CEA level.  Given the marked dilatation and  fluid-filled small bowel I would suggest going ahead and placing an NG tube and trying to decompress her.  If we do need to perform EGD, but would not feel comfortable with small bowel and stomach full of fluid.  This would potentially increase her risk of aspiration.  If colonoscopy or colon evaluation is needed would likely need to prep with enemas or do a barium enema.  We will await oncology's evaluation and the CEA level.   Nancy Fetter 09/15/2017, 2:53 PM   This note was created using voice recognition software and minor errors may Have occurred unintentionally. Pager: [phone] If no answer or after hours call [phone]

## 2017-09-15 NOTE — Consult Note (Signed)
General Surgery [facility]  Reason for Consult: colonic obstruction  Referring Physician: Dr. Doyle Askew, [facility]  Christie Maynard is an 62 y.o. female.  HPI: patient is a 62 yo WF recently admitted before Christmas with abdominal distension, nausea, and emesis.  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  Ureteral stent placement for obstruction performed by Dr. Alinda Money.  Patient underwent paracentesis.  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  Repeat paracentesis yesterday.  CTA does not show evidence of gastric or colonic mass.  Patient developed feculent emesis and inability to tolerated oral intake.  Now admitted from ER to medical service for further evaluation and management.  No prior abdominal surgery.  No family hx of GI malignancy or ovarian malignancy.  Past Medical History:  Diagnosis Date  . Cancer ([place])   . Ovarian ca ([place])   . Seizures ([place])   . UTI (urinary tract infection)     Past Surgical History:  Procedure Laterality Date  . CYSTOSCOPY W/ URETERAL STENT PLACEMENT Left 09/05/2017   Procedure: CYSTOSCOPY WITH LEFT URETERAL STENT REPLACEMENT;  Surgeon: Raynelle Bring, MD;  Location: [facility]S;  Service: Urology;  Laterality: Left;  . IR PARACENTESIS  09/13/2017  . NO PAST SURGERIES      Family History  Problem Relation Age of Onset  . Breast cancer Mother   . Lung cancer Father   . Breast cancer Sister   . Breast cancer Maternal Aunt   . Breast cancer Paternal Aunt   . Cancer Paternal Uncle     Social History:  reports that  has never smoked. she has never used smokeless tobacco. She reports that she does not drink alcohol or use drugs.  Allergies: No Known Allergies  Medications: I have reviewed the patient's current medications.  Results for orders placed or performed during the hospital encounter of 09/14/17 (from the past 48 hour(s))   Lipase, blood     Status: Abnormal   Collection Time: 09/14/17 11:49 AM  Result Value Ref Range   Lipase 69 (H) 11 - 51 U/L  Comprehensive metabolic panel     Status: Abnormal   Collection Time: 09/14/17 11:49 AM  Result Value Ref Range   Sodium 132 (L) 135 - 145 mmol/L   Potassium 4.3 3.5 - 5.1 mmol/L   Chloride 96 (L) 101 - 111 mmol/L   CO2 19 (L) 22 - 32 mmol/L   Glucose, Bld 109 (H) 65 - 99 mg/dL   BUN 20 6 - 20 mg/dL   Creatinine, Ser 1.04 (H) 0.44 - 1.00 mg/dL   Calcium 9.3 8.9 - 10.3 mg/dL   Total Protein 7.4 6.5 - 8.1 g/dL   Albumin 3.1 (L) 3.5 - 5.0 g/dL   AST 66 (H) 15 - 41 U/L   ALT 51 14 - 54 U/L   Alkaline Phosphatase 162 (H) 38 - 126 U/L   Total Bilirubin 1.6 (H) 0.3 - 1.2 mg/dL   GFR calc non Af Amer 56 (L) >60 mL/min   GFR calc Af Amer >60 >60 mL/min    Comment: (NOTE) The eGFR has been calculated using the CKD EPI equation. This calculation has not been validated in all clinical situations. eGFR's persistently <60 mL/min signify possible Chronic Kidney Disease.    Anion gap 17 (H) 5 - 15  CBC     Status: Abnormal   Collection Time: 09/14/17 11:49 AM  Result Value Ref Range  WBC 14.2 (H) 4.0 - 10.5 K/uL   RBC 5.06 3.87 - 5.11 MIL/uL   Hemoglobin 14.7 12.0 - 15.0 g/dL   HCT 43.6 36.0 - 46.0 %   MCV 86.2 78.0 - 100.0 fL   MCH 29.1 26.0 - 34.0 pg   MCHC 33.7 30.0 - 36.0 g/dL   RDW 14.2 11.5 - 15.5 %   Platelets 446 (H) 150 - 400 K/uL  Urinalysis, Routine w reflex microscopic     Status: Abnormal   Collection Time: 09/14/17  4:33 PM  Result Value Ref Range   Color, Urine AMBER (A) YELLOW    Comment: BIOCHEMICALS MAY BE AFFECTED BY COLOR   APPearance CLOUDY (A) CLEAR   Specific Gravity, Urine 1.021 1.005 - 1.030   pH 5.0 5.0 - 8.0   Glucose, UA NEGATIVE NEGATIVE mg/dL   Hgb urine dipstick MODERATE (A) NEGATIVE   Bilirubin Urine NEGATIVE NEGATIVE   Ketones, ur 80 (A) NEGATIVE mg/dL   Protein, ur 100 (A) NEGATIVE mg/dL   Nitrite NEGATIVE NEGATIVE    Leukocytes, UA MODERATE (A) NEGATIVE   RBC / HPF TOO NUMEROUS TO COUNT 0 - 5 RBC/hpf   WBC, UA TOO NUMEROUS TO COUNT 0 - 5 WBC/hpf   Bacteria, UA MANY (A) NONE SEEN   Squamous Epithelial / LPF 6-30 (A) NONE SEEN   Mucus PRESENT    Budding Yeast PRESENT    Hyaline Casts, UA PRESENT    Granular Casts, UA PRESENT    Amorphous Crystal PRESENT   Lactic acid, plasma     Status: None   Collection Time: 09/14/17  9:07 PM  Result Value Ref Range   Lactic Acid, Venous 1.6 0.5 - 1.9 mmol/L  Lactic acid, plasma     Status: None   Collection Time: 09/14/17 11:43 PM  Result Value Ref Range   Lactic Acid, Venous 0.9 0.5 - 1.9 mmol/L  Comprehensive metabolic panel     Status: Abnormal   Collection Time: 09/15/17  4:08 AM  Result Value Ref Range   Sodium 135 135 - 145 mmol/L   Potassium 4.0 3.5 - 5.1 mmol/L   Chloride 102 101 - 111 mmol/L   CO2 21 (L) 22 - 32 mmol/L   Glucose, Bld 99 65 - 99 mg/dL   BUN 19 6 - 20 mg/dL   Creatinine, Ser 0.87 0.44 - 1.00 mg/dL   Calcium 8.2 (L) 8.9 - 10.3 mg/dL   Total Protein 6.1 (L) 6.5 - 8.1 g/dL   Albumin 2.4 (L) 3.5 - 5.0 g/dL   AST 50 (H) 15 - 41 U/L   ALT 40 14 - 54 U/L   Alkaline Phosphatase 131 (H) 38 - 126 U/L   Total Bilirubin 1.3 (H) 0.3 - 1.2 mg/dL   GFR calc non Af Amer >60 >60 mL/min   GFR calc Af Amer >60 >60 mL/min    Comment: (NOTE) The eGFR has been calculated using the CKD EPI equation. This calculation has not been validated in all clinical situations. eGFR's persistently <60 mL/min signify possible Chronic Kidney Disease.    Anion gap 12 5 - 15  CBC     Status: Abnormal   Collection Time: 09/15/17  4:08 AM  Result Value Ref Range   WBC 8.7 4.0 - 10.5 K/uL   RBC 4.64 3.87 - 5.11 MIL/uL   Hemoglobin 13.0 12.0 - 15.0 g/dL   HCT 40.3 36.0 - 46.0 %   MCV 86.9 78.0 - 100.0 fL   MCH 28.0 26.0 -  34.0 pg   MCHC 32.3 30.0 - 36.0 g/dL   RDW 14.4 11.5 - 15.5 %   Platelets 402 (H) 150 - 400 K/uL  Glucose, capillary     Status: None    Collection Time: 09/15/17  7:49 AM  Result Value Ref Range   Glucose-Capillary 91 65 - 99 mg/dL    Ct Abdomen Pelvis W Contrast  Result Date: 09/14/2017 CLINICAL DATA:  Abdominal swelling and vomiting. Increasing weakness. Patient is recently been diagnosed with cervical cancer. Paracentesis 1 day ago and last week. EXAM: CT ABDOMEN AND PELVIS WITH CONTRAST TECHNIQUE: Multidetector CT imaging of the abdomen and pelvis was performed using the standard protocol following bolus administration of intravenous contrast. CONTRAST:  68m ISOVUE-300 IOPAMIDOL (ISOVUE-300) INJECTION 61% COMPARISON:  09/03/2017 FINDINGS: Lower chest: Small bilateral pleural effusions, greater on the right. Mild basilar atelectasis is likely compressive. Moderate-sized esophageal hiatal hernia. Hepatobiliary: 1 cm diameter hypo dense lesion in segment 4 of the liver may represent a metastatic focus. No other focal liver lesions identified. Gallbladder and bile ducts are unremarkable. Pancreas: Pancreas is atrophic. No focal lesion or inflammatory changes appreciated. Spleen: Spleen size is normal.  No focal lesions. Adrenals/Urinary Tract: Right adrenal gland nodule measuring 2.2 cm in diameter. Hounsfield unit measurements on portal venous and delayed phase imaging demonstrate a relative washout of 55%. Relative washout of 40% or greater suggests benign adenoma. Renal nephrograms are homogeneous. Mildly dilated left intrarenal collecting system. A left ureteral stent is in place. Bladder is unremarkable. Stomach/Bowel: Stomach is not abnormally distended. There is diffuse dilatation of fluid-filled small bowel with decompressed terminal ileum. This is progressing since the previous study. Transition zone appears to be in the right lower quadrant. Appearance is consistent with small bowel obstruction. Cause of obstruction may be extrinsic compression of the small bowel, metastasis, or direct invasion. Colon is decompressed. Small amount  of residual contrast material in the colon. Appendix is normal. Vascular/Lymphatic: No significant vascular findings are present. No enlarged abdominal or pelvic lymph nodes. Reproductive: Large heterogeneous pelvic mass extending above the uterus. This likely arises from the right ovary and appears to demonstrate direct invasion of the uterine fundus on the left. The mass measures about 6.7 by 10.2 x 10.7 cm. Other: Right lower quadrant mesenteric mass measuring 4.4 cm in diameter. Diffuse nodular infiltration throughout the omentum and mesenteric fat consistent with diffuse peritoneal metastasis. Small amount of free fluid throughout the abdomen and pelvis. No free air. Abdominal wall musculature appears intact. Musculoskeletal: Mild degenerative changes in the spine. Heterogeneous appearance of the proximal left femur with trabecular coarsening. Appearance is most consistent with Paget's disease although metastatic lesion could also have this appearance. IMPRESSION: 1. Large heterogeneous pelvic mass lesion consistent with malignancy, likely ovarian in origin. Probable direct invasion of the uterus. 2. Diffuse peritoneal carcinomatosis with nodular infiltration of the omentum and mesentery and diffuse peritoneal fluid. Right lower quadrant mass measuring 4.4 cm diameter, likely metastatic. 3. Progressing small bowel obstruction with diffusely dilated fluid-filled small bowel. Transition zone is in the right lower quadrant. 4. Right adrenal gland nodule has characteristics suggesting a benign adenoma. 5. Bone changes in the proximal left femur probably represent Paget's disease although bone metastasis is not excluded. Consider bone scan for further evaluation if clinically indicated. 6. Left 3 atrial stent in place with mild residual hydronephrosis. 7. 1 cm low-attenuation lesion in the left lobe of the liver suspicious for metastasis. Electronically Signed   By: WOren BeckmannD.  On: 09/14/2017 19:29    Dg Abd Acute W/chest  Result Date: 09/14/2017 CLINICAL DATA:  Vomiting. EXAM: DG ABDOMEN ACUTE W/ 1V CHEST COMPARISON:  08/26/2017 FINDINGS: Left-sided nephroureteral stent is identified. Small bowel air-fluid levels are identified. Small bowel loops are mildly increased in caliber measuring up to 2.6 cm. There is no evidence of free intraperitoneal air. Heart size and mediastinal contours are within normal limits. There are bilateral pleural effusions noted right greater than left. Both lungs are clear. IMPRESSION: 1. Mildly increased caliber of small bowel loops with air-fluid levels. Cannot rule out low grade partial obstruction. 2. Status post left ureteral stenting. 3. Bilateral pleural effusions. Electronically Signed   By: Kerby Moors M.D.   On: 09/14/2017 16:58   Ir Paracentesis  Result Date: 09/13/2017 INDICATION: Patient with recurrent ascites. Request is made for diagnostic and therapeutic paracentesis. EXAM: ULTRASOUND GUIDED DIAGNOSTIC AND THERAPEUTIC PARACENTESIS MEDICATIONS: 10 mL 2% lidocaine COMPLICATIONS: None immediate. PROCEDURE: Informed written consent was obtained from the patient after a discussion of the risks, benefits and alternatives to treatment. A timeout was performed prior to the initiation of the procedure. Initial ultrasound scanning demonstrates a large amount of ascites within the left lateral abdomen. The left lateral abdomen was prepped and draped in the usual sterile fashion. 2% lidocaine was used for local anesthesia. Following this, a 19 gauge, 7-cm, Yueh catheter was introduced. An ultrasound image was saved for documentation purposes. The paracentesis was performed. The catheter was removed and a dressing was applied. The patient tolerated the procedure well without immediate post procedural complication. FINDINGS: A total of approximately 2.0 liters of red-colored fluid was removed. Samples were sent to the laboratory as requested by the clinical team.  IMPRESSION: Successful ultrasound-guided paracentesis yielding 2.0 liters of peritoneal fluid. Read by:  Brynda Greathouse PA-C Electronically Signed   By: Jerilynn Mages.  Shick M.D.   On: 09/13/2017 15:21    Review of Systems  HENT: Negative.   Eyes: Negative.   Respiratory: Negative.   Cardiovascular: Negative.   Gastrointestinal: Positive for abdominal pain, constipation, nausea and vomiting. Negative for blood in stool and melena.  Genitourinary: Negative.   Musculoskeletal: Negative.   Skin: Negative.   Neurological: Positive for weakness.  Endo/Heme/Allergies: Negative.   Psychiatric/Behavioral: Negative.    Blood pressure 133/81, pulse (!) 104, temperature 98.2 F (36.8 C), temperature source Oral, resp. rate 18, height 5' 2"  (1.575 m), weight 63.4 kg (139 lb 12.8 oz), SpO2 97 %. Physical Exam  Constitutional: She is oriented to person, place, and time. She appears well-developed and well-nourished. No distress.  HENT:  Head: Normocephalic and atraumatic.  Right Ear: External ear normal.  Left Ear: External ear normal.  Mouth/Throat: No oropharyngeal exudate.  Eyes: Conjunctivae are normal. Pupils are equal, round, and reactive to light. Right eye exhibits no discharge. Left eye exhibits no discharge. No scleral icterus.  Neck: Normal range of motion. Neck supple. No tracheal deviation present. No thyromegaly present.  Cardiovascular: Normal rate, regular rhythm and normal heart sounds.  No murmur heard. Respiratory: Effort normal and breath sounds normal. No respiratory distress. She has no wheezes.  GI: Soft. Bowel sounds are normal. She exhibits distension (moderate). She exhibits no mass. There is tenderness (mild, diffuse, non-localized). There is no rebound and no guarding.  Musculoskeletal: Normal range of motion. She exhibits no edema or deformity.  Lymphadenopathy:    She has no cervical adenopathy.  Neurological: She is alert and oriented to person, place, and time.  Skin: Skin  is warm and dry. She is not diaphoretic.  Psychiatric: She has a normal mood and affect. Her behavior is normal. Judgment and thought content normal.    Assessment/Plan: Small bowel obstruction / colonic obstruction secondary to malignancy of unknown primary  NPO, ice chips - may need NG if continued emesis  IV hydration  GI consultant to evaluate today - likely colonoscopy and EGD to search for primary tumor  Will need CEA level  GYN-ONC involved  Medical oncology to evaluate - Dr. Benay Spice to see  Surgery will follow closely with you in case operative intervention required.  Armandina Gemma, [facility] Office: [place] 09/15/2017, 11:03 AM

## 2017-09-16 LAB — BASIC METABOLIC PANEL
Anion gap: 8 (ref 5–15)
BUN: 17 mg/dL (ref 6–20)
CALCIUM: 8.3 mg/dL — AB (ref 8.9–10.3)
CHLORIDE: 108 mmol/L (ref 101–111)
CO2: 23 mmol/L (ref 22–32)
Creatinine, Ser: 0.8 mg/dL (ref 0.44–1.00)
GFR calc Af Amer: >60 mL/min (ref >60)
GFR calc non Af Amer: >60 mL/min (ref >60)
GLUCOSE: 101 mg/dL — AB (ref 65–99)
POTASSIUM: 4.6 mmol/L (ref 3.5–5.1)
Sodium: 139 mmol/L (ref 135–145)

## 2017-09-16 LAB — URINE CULTURE: Culture: NO GROWTH

## 2017-09-16 LAB — CBC
HCT: 42.1 % (ref 36.0–46.0)
Hemoglobin: 13.7 g/dL (ref 12.0–15.0)
MCH: 28.9 pg (ref 26.0–34.0)
MCHC: 32.5 g/dL (ref 30.0–36.0)
MCV: 88.8 fL (ref 78.0–100.0)
PLATELETS: 407 10*3/uL — AB (ref 150–400)
RBC: 4.74 MIL/uL (ref 3.87–5.11)
RDW: 14.7 % (ref 11.5–15.5)
WBC: 8 10*3/uL (ref 4.0–10.5)

## 2017-09-16 LAB — GLUCOSE, CAPILLARY
GLUCOSE-CAPILLARY: 104 mg/dL — AB (ref 65–99)
Glucose-Capillary: 99 mg/dL (ref 65–99)

## 2017-09-16 NOTE — Progress Notes (Addendum)
Patient resting in bed in no acute distress.  Using pen and paper to communicate due to throat soreness related to NG tube.  Advised patient that she would still need GI evaluation but we were going to plan for a biopsy with IR as well to hopefully obtain a definitive diagnosis in order for treatment to be initiated.  Advised that Dr. Benay Spice is aware of her current situation and recommended proceeding with a biopsy as well.  Dr. Florene Glen notified of recommendations. GYN ONC will continue to follow.  No needs or concerns voiced by patient except for the desire to have the NG removed as soon as possible.    MD Note: Patient seen and evaluated by me. Reviewed her labs (CEA pending) and her recent scans. Discussed her case and reviewed events with her husband and son. Discussed that at present there is some doubt about the underlying diagnosis of ovarian cancer as this may be a primary GI cancer which is metastatic to the ovaries. Discussed that it is important to establish the diagnosis definitively as these different primary cancers are treated with different chemotherapeutic agents.  With respect to her GI obstruction, I agree with plan for a trial of conservative management with bowel rest and decompression. If no spontaneous resolution occurs in a timely fashion, and if this persistent obstruction prevents IR from obtaining a diagnostic biopsy, she may benefit from a surgery to divert her obstruction and sample the tumor mass so that chemotherapy can be expedited. If this is primary ovarian cancer I would not recommend primary debulking surgery as it is unlikely to be an optimal cytoreductive effort without incurring significant morbidity which would delay onset of chemotherapy.   She has not had significant PO intake for 2 weeks and may benefit from hyperalimentation if she does not resolve her obstruction in the next 48 hours.  Donaciano Eva, [facility] [phone].

## 2017-09-16 NOTE — Progress Notes (Signed)
Referring Physician(s): Rossi,E  Supervising Physician: Pilar Jarvis  Patient Status:  [facility] - In-pt  Chief Complaint:  Malignancy of unknown primary  Subjective: Patient familiar to IR service from prior paracenteses, last on 09/13/17 yielding 2 L of bloody fluid. Cytology revealed atypical cells of questionable GI origin.  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  Currently has NG tube in place.  Request now received from GYN oncology for peritoneal/omental nodularity biopsy for further pathological evaluation.  She currently denies fever, headache, chest pain, worsening dyspnea, nausea or bleeding. She continues to have some abdominal distention and sore throat.  Past Medical History:  Diagnosis Date  . Cancer ([place])   . Ovarian ca ([place])   . Seizures ([place])   . UTI (urinary tract infection)    Past Surgical History:  Procedure Laterality Date  . CYSTOSCOPY W/ URETERAL STENT PLACEMENT Left 09/05/2017   Procedure: CYSTOSCOPY WITH LEFT URETERAL STENT REPLACEMENT;  Surgeon: Raynelle Bring, MD;  Location: [facility];  Service: Urology;  Laterality: Left;  . IR PARACENTESIS  09/13/2017  . NO PAST SURGERIES      Allergies: Patient has no known allergies.  Medications: Prior to Admission medications   Medication Sig Start Date End Date Taking? Authorizing Provider  ibuprofen (ADVIL,MOTRIN) 200 MG tablet Take 400 mg by mouth every 6 (six) hours as needed for headache.   Yes [provider]  ondansetron (ZOFRAN) 4 MG tablet Take 1 tablet (4 mg total) by mouth every 6 (six) hours as needed for nausea. 09/05/17  Yes Aline August, MD  phenytoin (DILANTIN) 100 MG ER capsule Take 100 mg by mouth daily. 01/18/17  Yes [provider]  traMADol  (ULTRAM) 50 MG tablet Take 1 tablet (50 mg total) by mouth every 6 (six) hours as needed for moderate pain. 09/05/17 09/05/18 Yes Aline August, MD     Vital Signs: BP 137/88 (BP Location: Left Arm)   Pulse 93   Temp 98.6 F (37 C) (Oral)   Resp 18   Ht 5\' 2"  (1.575 m)   Wt 139 lb 12.8 oz (63.4 kg)   SpO2 96%   BMI 25.57 kg/m   Physical Exam awake, alert.  NG tube in place ;chest with slightly diminished breath sounds bases.  Heart with regular rate and rhythm.  Abdomen distended, mild diffuse tenderness to palpation, few BS; no lower extremity edema.  Imaging: Dg Abd 1 View  Result Date: 09/15/2017 CLINICAL DATA:  Nasogastric tube placement.  Pelvic malignancy. EXAM: ABDOMEN - 1 VIEW COMPARISON:  Radiographs and CT 09/14/2017. FINDINGS: 1537 hours. Enteric tube projects below the diaphragm with tip in the left subphrenic region, likely in the gastric fundus. There is a double-J left ureteral stent. Mild bowel wall thickening is present in the left mid abdomen. No supine evidence of free intraperitoneal air. Right pelvic calcifications consistent with phleboliths are stable. IMPRESSION: Enteric tube projects over the left upper quadrant of the abdomen, likely in the gastric fundus. Electronically Signed   By: Richardean Sale M.D.   On: 09/15/2017 16:11   Ct Abdomen Pelvis W Contrast  Result Date: 09/14/2017 CLINICAL DATA:  Abdominal swelling and vomiting. Increasing weakness. Patient is recently been diagnosed with cervical cancer. Paracentesis 1 day ago and last week. EXAM: CT ABDOMEN AND PELVIS WITH CONTRAST TECHNIQUE: Multidetector  CT imaging of the abdomen and pelvis was performed using the standard protocol following bolus administration of intravenous contrast. CONTRAST:  43mL ISOVUE-300 IOPAMIDOL (ISOVUE-300) INJECTION 61% COMPARISON:  09/03/2017 FINDINGS: Lower chest: Small bilateral pleural effusions, greater on the right. Mild basilar atelectasis is likely compressive.  Moderate-sized esophageal hiatal hernia. Hepatobiliary: 1 cm diameter hypo dense lesion in segment 4 of the liver may represent a metastatic focus. No other focal liver lesions identified. Gallbladder and bile ducts are unremarkable. Pancreas: Pancreas is atrophic. No focal lesion or inflammatory changes appreciated. Spleen: Spleen size is normal.  No focal lesions. Adrenals/Urinary Tract: Right adrenal gland nodule measuring 2.2 cm in diameter. Hounsfield unit measurements on portal venous and delayed phase imaging demonstrate a relative washout of 55%. Relative washout of 40% or greater suggests benign adenoma. Renal nephrograms are homogeneous. Mildly dilated left intrarenal collecting system. A left ureteral stent is in place. Bladder is unremarkable. Stomach/Bowel: Stomach is not abnormally distended. There is diffuse dilatation of fluid-filled small bowel with decompressed terminal ileum. This is progressing since the previous study. Transition zone appears to be in the right lower quadrant. Appearance is consistent with small bowel obstruction. Cause of obstruction may be extrinsic compression of the small bowel, metastasis, or direct invasion. Colon is decompressed. Small amount of residual contrast material in the colon. Appendix is normal. Vascular/Lymphatic: No significant vascular findings are present. No enlarged abdominal or pelvic lymph nodes. Reproductive: Large heterogeneous pelvic mass extending above the uterus. This likely arises from the right ovary and appears to demonstrate direct invasion of the uterine fundus on the left. The mass measures about 6.7 by 10.2 x 10.7 cm. Other: Right lower quadrant mesenteric mass measuring 4.4 cm in diameter. Diffuse nodular infiltration throughout the omentum and mesenteric fat consistent with diffuse peritoneal metastasis. Small amount of free fluid throughout the abdomen and pelvis. No free air. Abdominal wall musculature appears intact. Musculoskeletal:  Mild degenerative changes in the spine. Heterogeneous appearance of the proximal left femur with trabecular coarsening. Appearance is most consistent with Paget's disease although metastatic lesion could also have this appearance. IMPRESSION: 1. Large heterogeneous pelvic mass lesion consistent with malignancy, likely ovarian in origin. Probable direct invasion of the uterus. 2. Diffuse peritoneal carcinomatosis with nodular infiltration of the omentum and mesentery and diffuse peritoneal fluid. Right lower quadrant mass measuring 4.4 cm diameter, likely metastatic. 3. Progressing small bowel obstruction with diffusely dilated fluid-filled small bowel. Transition zone is in the right lower quadrant. 4. Right adrenal gland nodule has characteristics suggesting a benign adenoma. 5. Bone changes in the proximal left femur probably represent Paget's disease although bone metastasis is not excluded. Consider bone scan for further evaluation if clinically indicated. 6. Left 3 atrial stent in place with mild residual hydronephrosis. 7. 1 cm low-attenuation lesion in the left lobe of the liver suspicious for metastasis. Electronically Signed   By: Lucienne Capers M.D.   On: 09/14/2017 19:29   Dg Abd Acute W/chest  Result Date: 09/14/2017 CLINICAL DATA:  Vomiting. EXAM: DG ABDOMEN ACUTE W/ 1V CHEST COMPARISON:  08/26/2017 FINDINGS: Left-sided nephroureteral stent is identified. Small bowel air-fluid levels are identified. Small bowel loops are mildly increased in caliber measuring up to 2.6 cm. There is no evidence of free intraperitoneal air. Heart size and mediastinal contours are within normal limits. There are bilateral pleural effusions noted right greater than left. Both lungs are clear. IMPRESSION: 1. Mildly increased caliber of small bowel loops with air-fluid levels. Cannot rule out low grade partial  obstruction. 2. Status post left ureteral stenting. 3. Bilateral pleural effusions. Electronically Signed   By:  Kerby Moors M.D.   On: 09/14/2017 16:58   Ir Paracentesis  Result Date: 09/13/2017 INDICATION: Patient with recurrent ascites. Request is made for diagnostic and therapeutic paracentesis. EXAM: ULTRASOUND GUIDED DIAGNOSTIC AND THERAPEUTIC PARACENTESIS MEDICATIONS: 10 mL 2% lidocaine COMPLICATIONS: None immediate. PROCEDURE: Informed written consent was obtained from the patient after a discussion of the risks, benefits and alternatives to treatment. A timeout was performed prior to the initiation of the procedure. Initial ultrasound scanning demonstrates a large amount of ascites within the left lateral abdomen. The left lateral abdomen was prepped and draped in the usual sterile fashion. 2% lidocaine was used for local anesthesia. Following this, a 19 gauge, 7-cm, Yueh catheter was introduced. An ultrasound image was saved for documentation purposes. The paracentesis was performed. The catheter was removed and a dressing was applied. The patient tolerated the procedure well without immediate post procedural complication. FINDINGS: A total of approximately 2.0 liters of red-colored fluid was removed. Samples were sent to the laboratory as requested by the clinical team. IMPRESSION: Successful ultrasound-guided paracentesis yielding 2.0 liters of peritoneal fluid. Read by:  Brynda Greathouse PA-C Electronically Signed   By: Jerilynn Mages.  Shick M.D.   On: 09/13/2017 15:21    Labs:  CBC: Recent Labs    09/05/17 0545 09/14/17 1149 09/15/17 0408 09/16/17 0416  WBC 8.2 14.2* 8.7 8.0  HGB 13.3 14.7 13.0 13.7  HCT 40.5 43.6 40.3 42.1  PLT 400 446* 402* 407*    COAGS: Recent Labs    09/04/17 0534  INR 1.15    BMP: Recent Labs    09/05/17 0545 09/14/17 1149 09/15/17 0408 09/16/17 0416  NA 133* 132* 135 139  K 4.0 4.3 4.0 4.6  CL 101 96* 102 108  CO2 25 19* 21* 23  GLUCOSE 86 109* 99 101*  BUN 8 20 19 17   CALCIUM 8.5* 9.3 8.2* 8.3*  CREATININE 0.91 1.04* 0.87 0.80  GFRNONAA >60 56* >60 >60    GFRAA >60 >60 >60 >60    LIVER FUNCTION TESTS: Recent Labs    09/04/17 0534 09/05/17 0545 09/14/17 1149 09/15/17 0408  BILITOT 0.9 0.6 1.6* 1.3*  AST 24 22 66* 50*  ALT 26 21 51 40  ALKPHOS 98 78 162* 131*  PROT 6.3* 5.7* 7.4 6.1*  ALBUMIN 3.0* 2.8* 3.1* 2.4*    Assessment and Plan: Pt with hx large pelvic mass ,likely ovarian in origin with probable direct invasion of the uterus, 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  Currently has NG tube in place.  Cytology from recent paracentesis reveals atypical cells of questionable GI tract origin.  Request now received from GYN oncology for peritoneal/omental nodularity biopsy for further pathological evaluation prior to definitive treatment.  Latest imaging studies have been reviewed by Dr. Kathlene Cote.  Tentative plans are to obtain follow-up CT tomorrow to ascertain whether safe access to peritoneal nodularity is present in light of recent small bowel obstruction.  If safe access is available we will proceed with percutaneous biopsy.Risks and benefits discussed with the patient including, but not limited to bleeding, infection, damage to adjacent structures or low yield requiring additional tests.All of the patient's questions were answered, patient is agreeable to proceed.Consent signed and in chart.     Electronically Signed: D. Rowe Robert, PA-C 09/16/2017, 2:21 PM  I spent a total of 25 minutes at the the patient's bedside AND on the patient's hospital floor or unit, greater than 50% of which was counseling/coordinating care for CT-guided biopsy of peritoneal/omental nodularity.    Patient ID: Christie Maynard, female   DOB: 07/26/1956, 62 y.o.   MRN: [id number]

## 2017-09-16 NOTE — Progress Notes (Signed)
When patient got up to use the bedside commode, there was blood mixed with her urine as well as on her pad. New pad given to patient. Alger Memos, NP made aware. Will continue to monitor.

## 2017-09-16 NOTE — Care Management Note (Signed)
Case Management Note  Patient Details  Name: Christie Maynard MRN: [id number] Date of Birth: 12-08-1955  Subjective/Objective:   Pt admitted with Nausea & Vomiting.                 Action/Plan: Plan to discharge home with family. Declined HH at present time.   Expected Discharge Date:                  Expected Discharge Plan:  Home/Self Care  In-House Referral:     Discharge planning Services  CM Consult  Post Acute Care Choice:    Choice offered to:     DME Arranged:    DME Agency:     HH Arranged:    [place] Agency:     Status of Service:  Completed, signed off  If discussed at [employer] of Stay Meetings, dates discussed:    Additional CommentsPurcell Mouton, RN 09/16/2017, 3:30 PM

## 2017-09-16 NOTE — Progress Notes (Signed)
General Surgery [facility]  Assessment & Plan:  Small bowel obstruction / colonic obstruction secondary to malignancy of unknown primary             NPO, ice chips, NG placed for decompression and to evacuate stomach for possible EGD             IV hydration             CEA level pending             GYN-ONC to see today - possible IR biopsy             Dr. Benay Spice following for medical oncology  Surgery will continue to follow closely with you in case operative intervention required.        Earnstine Regal, MD, [facility]       Office: [phone]    Chief Complaint: Colonic/intestinal obstruction from tumor of unknown primary  Subjective: Patient in bed, family in room.  NG in place with moderate output.  Comfortable.  Objective: Vital signs in last 24 hours: Temp:  [97.8 F (36.6 C)-98.6 F (37 C)] 98.6 F (37 C) (01/02 2206) Pulse Rate:  [92-93] 93 (01/02 2206) Resp:  [18] 18 (01/02 2206) BP: (122-137)/(84-88) 137/88 (01/02 2206) SpO2:  [96 %-98 %] 96 % (01/02 2206) Last BM Date: 09/03/18  Intake/Output from previous day: 01/02 0701 - 01/03 0700 In: 0  Out: 550 [Emesis/NG output:550] Intake/Output this shift: No intake/output data recorded.  Physical Exam: HEENT - sclerae clear, mucous membranes moist Neck - soft Abdomen - softer, less distended; non-tender; bilious NG output Ext - no edema, non-tender Neuro - alert & oriented, no focal deficits  Lab Results:  Recent Labs    09/15/17 0408 09/16/17 0416  WBC 8.7 8.0  HGB 13.0 13.7  HCT 40.3 42.1  PLT 402* 407*   BMET Recent Labs    09/15/17 0408 09/16/17 0416  NA 135 139  K 4.0 4.6  CL 102 108  CO2 21* 23  GLUCOSE 99 101*  BUN 19 17  CREATININE 0.87 0.80  CALCIUM 8.2* 8.3*   PT/INR No results for input(s): LABPROT, INR in the last 72 hours. Comprehensive Metabolic Panel:    Component Value Date/Time   NA 139 09/16/2017 0416   NA  135 09/15/2017 0408   K 4.6 09/16/2017 0416   K 4.0 09/15/2017 0408   CL 108 09/16/2017 0416   CL 102 09/15/2017 0408   CO2 23 09/16/2017 0416   CO2 21 (L) 09/15/2017 0408   BUN 17 09/16/2017 0416   BUN 19 09/15/2017 0408   CREATININE 0.80 09/16/2017 0416   CREATININE 0.87 09/15/2017 0408   GLUCOSE 101 (H) 09/16/2017 0416   GLUCOSE 99 09/15/2017 0408   CALCIUM 8.3 (L) 09/16/2017 0416   CALCIUM 8.2 (L) 09/15/2017 0408   AST 50 (H) 09/15/2017 0408   AST 66 (H) 09/14/2017 1149   ALT 40 09/15/2017 0408   ALT 51 09/14/2017 1149   ALKPHOS 131 (H) 09/15/2017 0408   ALKPHOS 162 (H) 09/14/2017 1149   BILITOT 1.3 (H) 09/15/2017 0408   BILITOT 1.6 (H) 09/14/2017 1149   PROT 6.1 (L) 09/15/2017 0408   PROT 7.4 09/14/2017 1149   ALBUMIN 2.4 (L) 09/15/2017 0408   ALBUMIN 3.1 (L) 09/14/2017 1149    Studies/Results: Dg Abd 1 View  Result Date: 09/15/2017 CLINICAL DATA:  Nasogastric tube placement.  Pelvic malignancy. EXAM: ABDOMEN - 1 VIEW COMPARISON:  Radiographs and CT 09/14/2017. FINDINGS: 1537 hours. Enteric tube projects below the diaphragm with tip in the left subphrenic region, likely in the gastric fundus. There is a double-J left ureteral stent. Mild bowel wall thickening is present in the left mid abdomen. No supine evidence of free intraperitoneal air. Right pelvic calcifications consistent with phleboliths are stable. IMPRESSION: Enteric tube projects over the left upper quadrant of the abdomen, likely in the gastric fundus. Electronically Signed   By: Richardean Sale M.D.   On: 09/15/2017 16:11   Ct Abdomen Pelvis W Contrast  Result Date: 09/14/2017 CLINICAL DATA:  Abdominal swelling and vomiting. Increasing weakness. Patient is recently been diagnosed with cervical cancer. Paracentesis 1 day ago and last week. EXAM: CT ABDOMEN AND PELVIS WITH CONTRAST TECHNIQUE: Multidetector CT imaging of the abdomen and pelvis was performed using the standard protocol following bolus administration  of intravenous contrast. CONTRAST:  27mL ISOVUE-300 IOPAMIDOL (ISOVUE-300) INJECTION 61% COMPARISON:  09/03/2017 FINDINGS: Lower chest: Small bilateral pleural effusions, greater on the right. Mild basilar atelectasis is likely compressive. Moderate-sized esophageal hiatal hernia. Hepatobiliary: 1 cm diameter hypo dense lesion in segment 4 of the liver may represent a metastatic focus. No other focal liver lesions identified. Gallbladder and bile ducts are unremarkable. Pancreas: Pancreas is atrophic. No focal lesion or inflammatory changes appreciated. Spleen: Spleen size is normal.  No focal lesions. Adrenals/Urinary Tract: Right adrenal gland nodule measuring 2.2 cm in diameter. Hounsfield unit measurements on portal venous and delayed phase imaging demonstrate a relative washout of 55%. Relative washout of 40% or greater suggests benign adenoma. Renal nephrograms are homogeneous. Mildly dilated left intrarenal collecting system. A left ureteral stent is in place. Bladder is unremarkable. Stomach/Bowel: Stomach is not abnormally distended. There is diffuse dilatation of fluid-filled small bowel with decompressed terminal ileum. This is progressing since the previous study. Transition zone appears to be in the right lower quadrant. Appearance is consistent with small bowel obstruction. Cause of obstruction may be extrinsic compression of the small bowel, metastasis, or direct invasion. Colon is decompressed. Small amount of residual contrast material in the colon. Appendix is normal. Vascular/Lymphatic: No significant vascular findings are present. No enlarged abdominal or pelvic lymph nodes. Reproductive: Large heterogeneous pelvic mass extending above the uterus. This likely arises from the right ovary and appears to demonstrate direct invasion of the uterine fundus on the left. The mass measures about 6.7 by 10.2 x 10.7 cm. Other: Right lower quadrant mesenteric mass measuring 4.4 cm in diameter. Diffuse  nodular infiltration throughout the omentum and mesenteric fat consistent with diffuse peritoneal metastasis. Small amount of free fluid throughout the abdomen and pelvis. No free air. Abdominal wall musculature appears intact. Musculoskeletal: Mild degenerative changes in the spine. Heterogeneous appearance of the proximal left femur with trabecular coarsening. Appearance is most consistent with Paget's disease although metastatic lesion could also have this appearance. IMPRESSION: 1. Large heterogeneous pelvic mass lesion consistent with malignancy, likely ovarian in origin. Probable direct invasion of the uterus. 2. Diffuse peritoneal carcinomatosis with nodular infiltration of the omentum and mesentery and diffuse peritoneal fluid. Right lower quadrant mass measuring 4.4 cm diameter, likely metastatic. 3. Progressing small bowel obstruction with diffusely dilated fluid-filled small bowel. Transition zone is in the right lower quadrant. 4. Right adrenal gland nodule has characteristics suggesting a benign adenoma. 5. Bone changes in the proximal left femur probably represent Paget's disease although bone metastasis is not excluded. Consider bone scan for further  evaluation if clinically indicated. 6. Left 3 atrial stent in place with mild residual hydronephrosis. 7. 1 cm low-attenuation lesion in the left lobe of the liver suspicious for metastasis. Electronically Signed   By: Lucienne Capers M.D.   On: 09/14/2017 19:29   Dg Abd Acute W/chest  Result Date: 09/14/2017 CLINICAL DATA:  Vomiting. EXAM: DG ABDOMEN ACUTE W/ 1V CHEST COMPARISON:  08/26/2017 FINDINGS: Left-sided nephroureteral stent is identified. Small bowel air-fluid levels are identified. Small bowel loops are mildly increased in caliber measuring up to 2.6 cm. There is no evidence of free intraperitoneal air. Heart size and mediastinal contours are within normal limits. There are bilateral pleural effusions noted right greater than left. Both  lungs are clear. IMPRESSION: 1. Mildly increased caliber of small bowel loops with air-fluid levels. Cannot rule out low grade partial obstruction. 2. Status post left ureteral stenting. 3. Bilateral pleural effusions. Electronically Signed   By: Kerby Moors M.D.   On: 09/14/2017 16:58      Jatorian Renault M 09/16/2017  Patient ID: Christie Maynard, female   DOB: Sep 14, 1956, 63 y.o.   MRN: [id number]

## 2017-09-16 NOTE — Progress Notes (Signed)
[facility] PROGRESS NOTE Subjective Patient complaining of the NG tube but her abdomen feels much better.  Still no BM or flatus  Objective: Vital signs in last 24 hours: Temp:  [98.6 F (37 C)] 98.6 F (37 C) (01/02 2206) Pulse Rate:  [93] 93 (01/02 2206) Resp:  [18] 18 (01/02 2206) BP: (137)/(88) 137/88 (01/02 2206) SpO2:  [96 %] 96 % (01/02 2206) Last BM Date: 09/03/18  Intake/Output from previous day: 01/02 0701 - 01/03 0700 In: 0  Out: 550 [Emesis/NG output:550] Intake/Output this shift: No intake/output data recorded.  PE: General--alert pleasant no acute distress  Abdomen--less distended and softer particularly in the upper abdomen  Lab Results: Recent Labs    09/14/17 1149 09/15/17 0408 09/16/17 0416  WBC 14.2* 8.7 8.0  HGB 14.7 13.0 13.7  HCT 43.6 40.3 42.1  PLT 446* 402* 407*   BMET Recent Labs    09/14/17 1149 09/15/17 0408 09/16/17 0416  NA 132* 135 139  K 4.3 4.0 4.6  CL 96* 102 108  CO2 19* 21* 23  CREATININE 1.04* 0.87 0.80   LFT Recent Labs    09/14/17 1149 09/15/17 0408  PROT 7.4 6.1*  AST 66* 50*  ALT 51 40  ALKPHOS 162* 131*  BILITOT 1.6* 1.3*   PT/INR No results for input(s): LABPROT, INR in the last 72 hours. PANCREAS Recent Labs    09/14/17 1149  LIPASE 69*         Studies/Results: Dg Abd 1 View  Result Date: 09/15/2017 CLINICAL DATA:  Nasogastric tube placement.  Pelvic malignancy. EXAM: ABDOMEN - 1 VIEW COMPARISON:  Radiographs and CT 09/14/2017. FINDINGS: 1537 hours. Enteric tube projects below the diaphragm with tip in the left subphrenic region, likely in the gastric fundus. There is a double-J left ureteral stent. Mild bowel wall thickening is present in the left mid abdomen. No supine evidence of free intraperitoneal air. Right pelvic calcifications consistent with phleboliths are stable. IMPRESSION: Enteric tube projects over the left upper quadrant of the abdomen, likely in the gastric fundus.  Electronically Signed   By: Richardean Sale M.D.   On: 09/15/2017 16:11   Ct Abdomen Pelvis W Contrast  Result Date: 09/14/2017 CLINICAL DATA:  Abdominal swelling and vomiting. Increasing weakness. Patient is recently been diagnosed with cervical cancer. Paracentesis 1 day ago and last week. EXAM: CT ABDOMEN AND PELVIS WITH CONTRAST TECHNIQUE: Multidetector CT imaging of the abdomen and pelvis was performed using the standard protocol following bolus administration of intravenous contrast. CONTRAST:  6mL ISOVUE-300 IOPAMIDOL (ISOVUE-300) INJECTION 61% COMPARISON:  09/03/2017 FINDINGS: Lower chest: Small bilateral pleural effusions, greater on the right. Mild basilar atelectasis is likely compressive. Moderate-sized esophageal hiatal hernia. Hepatobiliary: 1 cm diameter hypo dense lesion in segment 4 of the liver may represent a metastatic focus. No other focal liver lesions identified. Gallbladder and bile ducts are unremarkable. Pancreas: Pancreas is atrophic. No focal lesion or inflammatory changes appreciated. Spleen: Spleen size is normal.  No focal lesions. Adrenals/Urinary Tract: Right adrenal gland nodule measuring 2.2 cm in diameter. Hounsfield unit measurements on portal venous and delayed phase imaging demonstrate a relative washout of 55%. Relative washout of 40% or greater suggests benign adenoma. Renal nephrograms are homogeneous. Mildly dilated left intrarenal collecting system. A left ureteral stent is in place. Bladder is unremarkable. Stomach/Bowel: Stomach is not abnormally distended. There is diffuse dilatation of fluid-filled small bowel with decompressed terminal ileum. This is progressing since the previous study. Transition zone appears to be in the  right lower quadrant. Appearance is consistent with small bowel obstruction. Cause of obstruction may be extrinsic compression of the small bowel, metastasis, or direct invasion. Colon is decompressed. Small amount of residual contrast material  in the colon. Appendix is normal. Vascular/Lymphatic: No significant vascular findings are present. No enlarged abdominal or pelvic lymph nodes. Reproductive: Large heterogeneous pelvic mass extending above the uterus. This likely arises from the right ovary and appears to demonstrate direct invasion of the uterine fundus on the left. The mass measures about 6.7 by 10.2 x 10.7 cm. Other: Right lower quadrant mesenteric mass measuring 4.4 cm in diameter. Diffuse nodular infiltration throughout the omentum and mesenteric fat consistent with diffuse peritoneal metastasis. Small amount of free fluid throughout the abdomen and pelvis. No free air. Abdominal wall musculature appears intact. Musculoskeletal: Mild degenerative changes in the spine. Heterogeneous appearance of the proximal left femur with trabecular coarsening. Appearance is most consistent with Paget's disease although metastatic lesion could also have this appearance. IMPRESSION: 1. Large heterogeneous pelvic mass lesion consistent with malignancy, likely ovarian in origin. Probable direct invasion of the uterus. 2. Diffuse peritoneal carcinomatosis with nodular infiltration of the omentum and mesentery and diffuse peritoneal fluid. Right lower quadrant mass measuring 4.4 cm diameter, likely metastatic. 3. Progressing small bowel obstruction with diffusely dilated fluid-filled small bowel. Transition zone is in the right lower quadrant. 4. Right adrenal gland nodule has characteristics suggesting a benign adenoma. 5. Bone changes in the proximal left femur probably represent Paget's disease although bone metastasis is not excluded. Consider bone scan for further evaluation if clinically indicated. 6. Left 3 atrial stent in place with mild residual hydronephrosis. 7. 1 cm low-attenuation lesion in the left lobe of the liver suspicious for metastasis. Electronically Signed   By: Lucienne Capers M.D.   On: 09/14/2017 19:29   Dg Abd Acute W/chest  Result  Date: 09/14/2017 CLINICAL DATA:  Vomiting. EXAM: DG ABDOMEN ACUTE W/ 1V CHEST COMPARISON:  08/26/2017 FINDINGS: Left-sided nephroureteral stent is identified. Small bowel air-fluid levels are identified. Small bowel loops are mildly increased in caliber measuring up to 2.6 cm. There is no evidence of free intraperitoneal air. Heart size and mediastinal contours are within normal limits. There are bilateral pleural effusions noted right greater than left. Both lungs are clear. IMPRESSION: 1. Mildly increased caliber of small bowel loops with air-fluid levels. Cannot rule out low grade partial obstruction. 2. Status post left ureteral stenting. 3. Bilateral pleural effusions. Electronically Signed   By: Kerby Moors M.D.   On: 09/14/2017 16:58    Medications: I have reviewed the patient's current medications.  Assessment:   1.  Pelvic mass with small bowel obstruction.  CA 125 elevated, oncology has discussed with IR and an attempt will be made tomorrow with IR performing CT-guided biopsy.  CEA still pending at this time.   Plan: We will continue NG suction to decompress the stomach and small bowel.  Check on the results of the biopsy and CEA.  May or may not need endoscopy.   Nancy Fetter 09/16/2017, 4:22 PM  This note was created using voice recognition software. Minor errors may Have occurred unintentionally.  Pager: [phone] If no answer or after hours call [phone]

## 2017-09-16 NOTE — Progress Notes (Signed)
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 Patient reports poor oral intake and unable to eat solid foods for past few weeks but 24 hours prior to this admission, she felt Britani Beattie lot worse, bloated, uncomfortable. Off note patient had appointment with gastroenterologist scheduled for 09/15/2017 due to cytology results concerning for adenocarcinoma.  Assessment & Plan:   Principal Problem:   Nausea & vomiting Active Problems:   Ascites   Peritoneal carcinomatosis (HCC)   History of seizures   Nausea and vomiting  Peritoneal Carcinomatosis  Ascites  Nausea and vomiting  Small Bowel Obstruction: Primary unclear.  Pt was following with Dr. 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  Plan was for carboplatin/paclitaxel x 3 cycles followed by debulking, but it looks like cytology from paracentesis on 12/22 has come back atypical cells suspicious for adenocarcinoma of GI tract and Nettie Cromwell GI consult was pending.   - CT abdomen/pelvis with contrast with progressing SBO  - NGT to LIS - appreciate GI c/s (awaiting CEA results and biopsy results, considering endoscopy) - appreciate surgery c/s - appreciate gyn onc c/s (primary malignancy unclear at this point - recommended biopsy by IR - if persistent obstruction, may need parenteral nutrition) - Dr. Denman George added to treatment teams, will need to consult gyn onc as well as oncology (doesn't look like she'd officially established with oncology yet) officially tomorrow (given findings from cytology, would discuss GI consult as well) - antiemetics (QT at presentation appropriate)  Anion gap metabolic  acidosis: improved  L ureteral obstruction s/p stent placement: Follow creatinine  Abnormal UA: follow culture, no clear sx  Leukocytosis: no infectious sx.  CTM.  Mildly elevated liver enzymes: continue to monitor, follow imaging above  Mildly elevated lipase: exam doesn't seem c/w pancreatitis, follow imaging  History of seizures: has had 1 seizure in 1980's, none since.  Stable on dilantin. Will give IV dilantin while NPO.  DVT prophylaxis: SCD, resume lovenox when able postprocedure Code Status: full  Family Communication: none at bedside Disposition Plan: pending   Consultants:   Gyn onc  Surgery  GI  Procedures:   NG tube placement  Antimicrobials: (specify start and planned stop date. Auto populated tables are space occupying and do not give end dates) Anti-infectives (From admission, onward)   None         Subjective: Feels better.  No BM or flatus.  Objective: Vitals:   09/14/17 2110 09/15/17 0542 09/15/17 1254 09/15/17 2206  BP: 126/82 133/81 122/84 137/88  Pulse: 100 (!) 104 92 93  Resp: 18 18 18 18   Temp: 98 F (36.7 C) 98.2 F (36.8 C) 97.8 F (36.6 C) 98.6 F (37 C)  TempSrc: Oral Oral Oral Oral  SpO2: 98% 97% 98% 96%  Weight: 63.4 kg (139 lb 12.8 oz)     Height: 5\' 2"  (1.575 m)       Intake/Output Summary (Last 24 hours) at 09/16/2017 1910 Last data filed at 09/16/2017 1800 Gross per 24 hour  Intake 0 ml  Output 300 ml  Net -300 ml   Filed Weights   09/14/17 1127 09/14/17 2110  Weight: 68 kg (150 lb) 63.4 kg (139 lb 12.8 oz)  Examination:  General exam: Appears calm and comfortable  Respiratory system: Clear to auscultation. Respiratory effort normal. Cardiovascular system: S1 & S2 heard, RRR. No JVD, murmurs, rubs, gallops or clicks. No pedal edema. Gastrointestinal system: Abdomen is nondistended, soft and nontender. No organomegaly or masses felt. No bowel sounds. Central nervous system: Alert and oriented. No focal  neurological deficits. Extremities: Symmetric 5 x 5 power. Skin: No rashes, lesions or ulcers Psychiatry: Judgement and insight appear normal. Mood & affect appropriate.     Data Reviewed: I have personally reviewed following labs and imaging studies  CBC: Recent Labs  Lab 09/14/17 1149 09/15/17 0408 09/16/17 0416  WBC 14.2* 8.7 8.0  HGB 14.7 13.0 13.7  HCT 43.6 40.3 42.1  MCV 86.2 86.9 88.8  PLT 446* 402* 161*   Basic Metabolic Panel: Recent Labs  Lab 09/14/17 1149 09/15/17 0408 09/16/17 0416  NA 132* 135 139  K 4.3 4.0 4.6  CL 96* 102 108  CO2 19* 21* 23  GLUCOSE 109* 99 101*  BUN 20 19 17   CREATININE 1.04* 0.87 0.80  CALCIUM 9.3 8.2* 8.3*   GFR: Estimated Creatinine Clearance: 63 mL/min (by C-G formula based on SCr of 0.8 mg/dL). Liver Function Tests: Recent Labs  Lab 09/14/17 1149 09/15/17 0408  AST 66* 50*  ALT 51 40  ALKPHOS 162* 131*  BILITOT 1.6* 1.3*  PROT 7.4 6.1*  ALBUMIN 3.1* 2.4*   Recent Labs  Lab 09/14/17 1149  LIPASE 69*   No results for input(s): AMMONIA in the last 168 hours. Coagulation Profile: No results for input(s): INR, PROTIME in the last 168 hours. Cardiac Enzymes: No results for input(s): CKTOTAL, CKMB, CKMBINDEX, TROPONINI in the last 168 hours. BNP (last 3 results) No results for input(s): PROBNP in the last 8760 hours. HbA1C: No results for input(s): HGBA1C in the last 72 hours. CBG: Recent Labs  Lab 09/15/17 0749 09/16/17 0747  GLUCAP 91 104*   Lipid Profile: No results for input(s): CHOL, HDL, LDLCALC, TRIG, CHOLHDL, LDLDIRECT in the last 72 hours. Thyroid Function Tests: No results for input(s): TSH, T4TOTAL, FREET4, T3FREE, THYROIDAB in the last 72 hours. Anemia Panel: No results for input(s): VITAMINB12, FOLATE, FERRITIN, TIBC, IRON, RETICCTPCT in the last 72 hours. Sepsis Labs: Recent Labs  Lab 09/14/17 2107 09/14/17 2343  LATICACIDVEN 1.6 0.9    Recent Results (from the past 240 hour(s))    Culture, Urine     Status: None   Collection Time: 09/14/17  4:30 PM  Result Value Ref Range Status   Specimen Description URINE, RANDOM  Final   Special Requests NONE  Final   Culture   Final    NO GROWTH Performed at [facility], [address], [place], [place] [address]    Report Status 09/16/2017 FINAL  Final         Radiology Studies: Dg Abd 1 View  Result Date: 09/15/2017 CLINICAL DATA:  Nasogastric tube placement.  Pelvic malignancy. EXAM: ABDOMEN - 1 VIEW COMPARISON:  Radiographs and CT 09/14/2017. FINDINGS: 1537 hours. Enteric tube projects below the diaphragm with tip in the left subphrenic region, likely in the gastric fundus. There is Cassidey Barrales double-J left ureteral stent. Mild bowel wall thickening is present in the left mid abdomen. No supine evidence of free intraperitoneal air. Right pelvic calcifications consistent with phleboliths are stable. IMPRESSION: Enteric tube projects over the left upper quadrant of the abdomen, likely in the gastric fundus. Electronically Signed   By: Caryl Comes.D.  On: 09/15/2017 16:11        Scheduled Meds: . phenytoin (DILANTIN) IV  100 mg Intravenous QHS   Continuous Infusions:   LOS: 1 day    Time spent: over 30 min    Fayrene Helper, MD [facility] [phone]  If 7PM-7AM, please contact night-coverage [email or web address] Password [email or web address] 09/16/2017, 7:10 PM

## 2017-09-17 ENCOUNTER — Encounter (HOSPITAL_COMMUNITY): Payer: Self-pay | Admitting: Radiology

## 2017-09-17 ENCOUNTER — Inpatient Hospital Stay (HOSPITAL_COMMUNITY): Payer: BLUE CROSS/BLUE SHIELD

## 2017-09-17 ENCOUNTER — Ambulatory Visit: Payer: BLUE CROSS/BLUE SHIELD | Admitting: Hematology and Oncology

## 2017-09-17 LAB — CBC WITH DIFFERENTIAL/PLATELET
BASOS ABS: 0 10*3/uL (ref 0.0–0.1)
BASOS PCT: 0 %
Eosinophils Absolute: 0 10*3/uL (ref 0.0–0.7)
Eosinophils Relative: 0 %
HEMATOCRIT: 39.4 % (ref 36.0–46.0)
HEMOGLOBIN: 12.9 g/dL (ref 12.0–15.0)
Lymphocytes Relative: 13 %
Lymphs Abs: 1 10*3/uL (ref 0.7–4.0)
MCH: 29 pg (ref 26.0–34.0)
MCHC: 32.7 g/dL (ref 30.0–36.0)
MCV: 88.5 fL (ref 78.0–100.0)
Monocytes Absolute: 1.1 10*3/uL — ABNORMAL HIGH (ref 0.1–1.0)
Monocytes Relative: 14 %
NEUTROS ABS: 5.6 10*3/uL (ref 1.7–7.7)
NEUTROS PCT: 73 %
Platelets: 401 10*3/uL — ABNORMAL HIGH (ref 150–400)
RBC: 4.45 MIL/uL (ref 3.87–5.11)
RDW: 14.6 % (ref 11.5–15.5)
WBC: 7.7 10*3/uL (ref 4.0–10.5)

## 2017-09-17 LAB — COMPREHENSIVE METABOLIC PANEL
ALT: 25 U/L (ref 14–54)
ANION GAP: 9 (ref 5–15)
AST: 20 U/L (ref 15–41)
Albumin: 2.4 g/dL — ABNORMAL LOW (ref 3.5–5.0)
Alkaline Phosphatase: 100 U/L (ref 38–126)
BILIRUBIN TOTAL: 1.1 mg/dL (ref 0.3–1.2)
BUN: 17 mg/dL (ref 6–20)
CHLORIDE: 110 mmol/L (ref 101–111)
CO2: 22 mmol/L (ref 22–32)
Calcium: 8.4 mg/dL — ABNORMAL LOW (ref 8.9–10.3)
Creatinine, Ser: 0.71 mg/dL (ref 0.44–1.00)
Glucose, Bld: 104 mg/dL — ABNORMAL HIGH (ref 65–99)
Potassium: 3.8 mmol/L (ref 3.5–5.1)
Sodium: 141 mmol/L (ref 135–145)
TOTAL PROTEIN: 5.9 g/dL — AB (ref 6.5–8.1)

## 2017-09-17 LAB — CEA: CEA1: 2751 ng/mL — AB (ref 0.0–4.7)

## 2017-09-17 LAB — GLUCOSE, CAPILLARY: GLUCOSE-CAPILLARY: 102 mg/dL — AB (ref 65–99)

## 2017-09-17 LAB — PROTIME-INR
INR: 1.51
Prothrombin Time: 18.1 seconds — ABNORMAL HIGH (ref 11.4–15.2)

## 2017-09-17 MED ORDER — FLUMAZENIL 0.5 MG/5ML IV SOLN
INTRAVENOUS | Status: AC
  Filled 2017-09-17: qty 5

## 2017-09-17 MED ORDER — FENTANYL CITRATE (PF) 100 MCG/2ML IJ SOLN
INTRAMUSCULAR | Status: AC
  Filled 2017-09-17: qty 4

## 2017-09-17 MED ORDER — MIDAZOLAM HCL 2 MG/2ML IJ SOLN
INTRAMUSCULAR | Status: AC | PRN
  Administered 2017-09-17 (×2): 1 mg via INTRAVENOUS

## 2017-09-17 MED ORDER — FENTANYL CITRATE (PF) 100 MCG/2ML IJ SOLN
INTRAMUSCULAR | Status: AC | PRN
  Administered 2017-09-17 (×2): 50 ug via INTRAVENOUS

## 2017-09-17 MED ORDER — LIDOCAINE HCL (PF) 1 % IJ SOLN
INTRAMUSCULAR | Status: AC | PRN
  Administered 2017-09-17: 10 mL via INTRADERMAL

## 2017-09-17 MED ORDER — ENOXAPARIN SODIUM 40 MG/0.4ML ~~LOC~~ SOLN
40.0000 mg | SUBCUTANEOUS | Status: DC
  Administered 2017-09-17 – 2017-09-30 (×14): 40 mg via SUBCUTANEOUS
  Filled 2017-09-17 (×14): qty 0.4

## 2017-09-17 MED ORDER — SODIUM CHLORIDE 0.9 % IV SOLN
INTRAVENOUS | Status: AC
Start: 2017-09-17 — End: 2017-09-19
  Administered 2017-09-17 – 2017-09-19 (×4): via INTRAVENOUS

## 2017-09-17 MED ORDER — NALOXONE HCL 0.4 MG/ML IJ SOLN
INTRAMUSCULAR | Status: AC
  Filled 2017-09-17: qty 1

## 2017-09-17 MED ORDER — MIDAZOLAM HCL 2 MG/2ML IJ SOLN
INTRAMUSCULAR | Status: AC
  Filled 2017-09-17: qty 4

## 2017-09-17 NOTE — Progress Notes (Signed)
[facility] PROGRESS NOTE Subjective CEA markedly elevated consistent with GI tumor.  Patient doing much better with decompression with NG.  Approximately 500 cc NG output today.  She did have small bowel movement.  Percutaneous biopsy was obtained and is pending  Objective: Vital signs in last 24 hours: Temp:  [98.3 F (36.8 C)-98.5 F (36.9 C)] 98.3 F (36.8 C) (01/04 0636) Pulse Rate:  [85-98] 85 (01/04 1410) Resp:  [14-24] 19 (01/04 1410) BP: (105-150)/(66-95) 111/71 (01/04 1410) SpO2:  [95 %-100 %] 95 % (01/04 1421) Last BM Date: 09/16/17  Intake/Output from previous day: 01/03 0701 - 01/04 0700 In: 0  Out: 350 [Emesis/NG output:350] Intake/Output this shift: Total I/O In: -  Out: 550 [Emesis/NG output:550]  PE: General--NG in place draining bilious material  Abdomen--much less distended nontender  Lab Results: Recent Labs    09/15/17 0408 09/16/17 0416 09/17/17 0427  WBC 8.7 8.0 7.7  HGB 13.0 13.7 12.9  HCT 40.3 42.1 39.4  PLT 402* 407* 401*   BMET Recent Labs    09/15/17 0408 09/16/17 0416 09/17/17 0427  NA 135 139 141  K 4.0 4.6 3.8  CL 102 108 110  CO2 21* 23 22  CREATININE 0.87 0.80 0.71   LFT Recent Labs    09/15/17 0408 09/17/17 0427  PROT 6.1* 5.9*  AST 50* 20  ALT 40 25  ALKPHOS 131* 100  BILITOT 1.3* 1.1   PT/INR Recent Labs    09/17/17 0427  LABPROT 18.1*  INR 1.51   PANCREAS No results for input(s): LIPASE in the last 72 hours.       Studies/Results: Ct Biopsy  Result Date: 09/17/2017 CLINICAL DATA:  Pelvic mass, diffuse peritoneal carcinomatosis and ascites. Prior cytology of peritoneal fluid was not conclusive for gynecologic malignancy and suggested potential GI origin. Request has now been made to perform biopsy of peritoneal tumor for more definitive diagnosis. EXAM: CT GUIDED CORE BIOPSY OF PERITONEAL MASS COMPARISON:  CT of the abdomen and pelvis on 09/14/2017 and 09/03/2017 ANESTHESIA/SEDATION: 2.0 mg  IV Versed; 100 mcg IV Fentanyl Total Moderate Sedation Time:  20 minutes. The patient's level of consciousness and physiologic status were continuously monitored during the procedure by Radiology nursing. PROCEDURE: The procedure risks, benefits, and alternatives were explained to the patient. Questions regarding the procedure were encouraged and answered. The patient understands and consents to the procedure. The abdominal wall was prepped with chlorhexidine in a sterile fashion, and a sterile drape was applied covering the operative field. A sterile gown and sterile gloves were used for the procedure. Local anesthesia was provided with 1% Lidocaine. CT was performed in a supine position. Under CT guidance, a 59 gauge trocar needle was advanced from an anterior oblique position into the left anterior peritoneal cavity. Core biopsy was performed with an 18 gauge core biopsy needle device. A total of 4 samples were obtained and submitted in formalin. Additional CT images were obtained after outer needle removal. COMPLICATIONS: None FINDINGS: Small bowel shows decompression since prior CT on 09/14/2017 after nasogastric decompression. This now allows for a safe window to approach of peritoneal tumor in the anterior peritoneal cavity/omental region. Tumor just deep to the left anterior abdominal wall was targeted and revealed solid tissue with biopsy. IMPRESSION: CT-guided core biopsy performed of peritoneal tumor. Electronically Signed   By: Aletta Edouard M.D.   On: 09/17/2017 16:26    Medications: I have reviewed the patient's current medications.  Assessment:   1.  Small bowel obstruction  secondary to pelvic mass.  Mass probably GI in origin with markedly elevated CEA.  Biopsy pending.  Patient will likely need surgery due to SBO.  Have discussed with Dr Harlow Asa.  Colonoscopy would not likely be beneficial since would not be able to perform a very good prep.  We therefore will try to go ahead with a  water-soluble contrast enema in an attempt to determine if the mass in fact is emanating from the colon.  We will go ahead and order this for her in the morning.  Plan: We will order water-soluble contrast enema to determine if this mass in fact is emanating from the colon and if so exactly what location.  Have discussed with Dr Harlow Asa and with the patient   Nancy Fetter 09/17/2017, 5:20 PM  This note was created using voice recognition software. Minor errors may Have occurred unintentionally.  Pager: [phone] If no answer or after hours call [phone]

## 2017-09-17 NOTE — Plan of Care (Signed)
Tap water enema completed. 

## 2017-09-17 NOTE — Procedures (Signed)
Interventional Radiology Procedure Note  Procedure: CT guided biopsy of peritoneal mass  Complications: None  Estimated Blood Loss: < 10 mL  Findings: Small bowel now significantly decompressed, allowing safe biopsy of upper peritoneal mass in anterior abdomen.  18 G core biopsy x 4 via 17 G needle. Solid tissue obtained.  Initial CT shows decompressed colon which can be followed and contains stool.  No evidence of obvious colonic mass.  Venetia Night. Kathlene Cote, M.D Pager:  [phone]

## 2017-09-17 NOTE — Progress Notes (Signed)
Patient seen.  Resting in bed in no acute distress.  Stating she just spoke with Dr. Harlow Asa with General Surgeon with the plan to obtain a barium enema.  She is hoping IR will be able to proceed with a biopsy today.  No concerns voiced.  Will continue to follow.

## 2017-09-17 NOTE — Progress Notes (Signed)
General Surgery [facility]  Assessment & Plan:  Small bowel obstruction / colonic obstruction secondary to malignancy of unknown primary NPO, ice chips, NG placed for decompression IV hydration CEA level markedly elevated favoring GI primary tumor IR biopsy of pelvic mass scheduled for today              Discussed with Dr. Oletta Lamas (GI) - will order gastrograffin enema to evaluate for colon neoplasm Dr. Benay Spice following for medical oncology        Earnstine Regal, MD, [facility]       Office: [phone]    Chief Complaint: Intra-abdominal malignancy with small bowel obstruction  Subjective: Patient comfortable in bed, no complaints.  Denies pain.  Had bowel movement with some blood.  Objective: Vital signs in last 24 hours: Temp:  [98.3 F (36.8 C)-98.5 F (36.9 C)] 98.3 F (36.8 C) (01/04 0636) Pulse Rate:  [93-98] 98 (01/04 0636) Resp:  [16-18] 18 (01/04 0636) BP: (140-150)/(95) 150/95 (01/04 0636) SpO2:  [98 %-99 %] 98 % (01/04 0636) Last BM Date: 09/16/17  Intake/Output from previous day: 01/03 0701 - 01/04 0700 In: 0  Out: 350 [Emesis/NG output:350] Intake/Output this shift: Total I/O In: -  Out: 500 [Emesis/NG output:500]  Physical Exam: HEENT - sclerae clear, mucous membranes moist Neck - soft Chest - clear bilaterally Cor - RRR Abdomen - mild distension, non-tender; bilious NG output Ext - no edema, non-tender Neuro - alert & oriented, no focal deficits  Lab Results:  Recent Labs    09/16/17 0416 09/17/17 0427  WBC 8.0 7.7  HGB 13.7 12.9  HCT 42.1 39.4  PLT 407* 401*   BMET Recent Labs    09/16/17 0416 09/17/17 0427  NA 139 141  K 4.6 3.8  CL 108 110  CO2 23 22  GLUCOSE 101* 104*  BUN 17 17  CREATININE 0.80 0.71  CALCIUM 8.3* 8.4*   PT/INR Recent Labs    09/17/17 0427  LABPROT 18.1*  INR 1.51   Comprehensive  Metabolic Panel:    Component Value Date/Time   NA 141 09/17/2017 0427   NA 139 09/16/2017 0416   K 3.8 09/17/2017 0427   K 4.6 09/16/2017 0416   CL 110 09/17/2017 0427   CL 108 09/16/2017 0416   CO2 22 09/17/2017 0427   CO2 23 09/16/2017 0416   BUN 17 09/17/2017 0427   BUN 17 09/16/2017 0416   CREATININE 0.71 09/17/2017 0427   CREATININE 0.80 09/16/2017 0416   GLUCOSE 104 (H) 09/17/2017 0427   GLUCOSE 101 (H) 09/16/2017 0416   CALCIUM 8.4 (L) 09/17/2017 0427   CALCIUM 8.3 (L) 09/16/2017 0416   AST 20 09/17/2017 0427   AST 50 (H) 09/15/2017 0408   ALT 25 09/17/2017 0427   ALT 40 09/15/2017 0408   ALKPHOS 100 09/17/2017 0427   ALKPHOS 131 (H) 09/15/2017 0408   BILITOT 1.1 09/17/2017 0427   BILITOT 1.3 (H) 09/15/2017 0408   PROT 5.9 (L) 09/17/2017 0427   PROT 6.1 (L) 09/15/2017 0408   ALBUMIN 2.4 (L) 09/17/2017 0427   ALBUMIN 2.4 (L) 09/15/2017 0408    Studies/Results: Dg Abd 1 View  Result Date: 09/15/2017 CLINICAL DATA:  Nasogastric tube placement.  Pelvic malignancy. EXAM: ABDOMEN - 1 VIEW COMPARISON:  Radiographs and CT 09/14/2017. FINDINGS: 1537 hours. Enteric tube projects below the diaphragm with tip in the left subphrenic region, likely in the gastric fundus. There  is a double-J left ureteral stent. Mild bowel wall thickening is present in the left mid abdomen. No supine evidence of free intraperitoneal air. Right pelvic calcifications consistent with phleboliths are stable. IMPRESSION: Enteric tube projects over the left upper quadrant of the abdomen, likely in the gastric fundus. Electronically Signed   By: Richardean Sale M.D.   On: 09/15/2017 16:11      [place] M 09/17/2017  Patient ID: Christie Maynard, female   DOB: 01-06-1956, 62 y.o.   MRN: [id number]

## 2017-09-17 NOTE — Progress Notes (Addendum)
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 Patient reports poor oral intake and unable to eat solid foods for past few weeks but 24 hours prior to this admission, she felt Enslee Bibbins lot worse, bloated, uncomfortable. Off note patient had appointment with gastroenterologist scheduled for 09/15/2017 due to cytology results concerning for adenocarcinoma.  Assessment & Plan:   Principal Problem:   Nausea & vomiting Active Problems:   Ascites   Peritoneal carcinomatosis (HCC)   History of seizures   Nausea and vomiting  Peritoneal Carcinomatosis  Ascites  Nausea and vomiting  Small Bowel Obstruction: Primary unclear.  Pt was following with Dr. 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  Plan was for carboplatin/paclitaxel x 3 cycles followed by debulking, but it looks like cytology from paracentesis on 12/22 has come back atypical cells suspicious for adenocarcinoma of GI tract and Aljean Horiuchi GI consult was pending.   - CT abdomen/pelvis with contrast with progressing SBO  - NGT to LIS - appreciate GI c/s (awaiting CEA results (elevated) and biopsy results, considering endoscopy - looks like plan for contrast enema) - appreciate surgery c/s - appreciate gyn onc c/s (primary malignancy unclear at this point - s/p biopsy by IR - if persistent obstruction, may need parenteral nutrition) - antiemetics (QT at presentation appropriate)  Anion gap metabolic acidosis: improved  L ureteral obstruction s/p stent placement: Follow creatinine  Abnormal UA: follow culture, no clear sx  Leukocytosis: no infectious sx.  CTM.  Mildly elevated liver  enzymes: continue to monitor, follow imaging above  Mildly elevated lipase: exam doesn't seem c/w pancreatitis, follow imaging  History of seizures: has had 1 seizure in 1980's, none since.  Stable on dilantin. Will give IV dilantin while NPO.  DVT prophylaxis: SCD, resume lovenox when able postprocedure Code Status: full  Family Communication: none at bedside Disposition Plan: pending   Consultants:   Gyn onc  Surgery  GI  Procedures:   NG tube placement  Antimicrobials: (specify start and planned stop date. Auto populated tables are space occupying and do not give end dates) Anti-infectives (From admission, onward)   None         Subjective: Has had BM.    Objective: Vitals:   09/17/17 1401 09/17/17 1405 09/17/17 1410 09/17/17 1421  BP: 105/66 105/66 111/71   Pulse: 88 96 85   Resp: 14 19 19    Temp:      TempSrc:      SpO2: 100% 100% 100% 95%  Weight:      Height:        Intake/Output Summary (Last 24 hours) at 09/17/2017 2107 Last data filed at 09/17/2017 1500 Gross per 24 hour  Intake 0 ml  Output 550 ml  Net -550 ml   Filed Weights   09/14/17 1127 09/14/17 2110  Weight: 68 kg (150 lb) 63.4 kg (139 lb 12.8 oz)    Examination:  General: No acute distress. Cardiovascular: Heart sounds show Dae Antonucci regular rate, and rhythm. No gallops or rubs. No murmurs. No JVD. Lungs: Clear to auscultation bilaterally with good air movement. No rales, rhonchi or wheezes. Abdomen: Soft, nontender, nondistended with quiet bowel sounds. No masses. No hepatosplenomegaly. Neurological: Alert  and oriented 3. Moves all extremities 4 with equal strength. Cranial nerves II through XII grossly intact. Skin: Warm and dry. No rashes or lesions. Extremities: No clubbing or cyanosis. No edema.  Psychiatric: Mood and affect are normal. Insight and judgment are appropriate.  Data Reviewed: I have personally reviewed following labs and imaging studies  CBC: Recent Labs  Lab  09/14/17 1149 09/15/17 0408 09/16/17 0416 10-12-2017 0427  WBC 14.2* 8.7 8.0 7.7  NEUTROABS  --   --   --  5.6  HGB 14.7 13.0 13.7 12.9  HCT 43.6 40.3 42.1 39.4  MCV 86.2 86.9 88.8 88.5  PLT 446* 402* 407* 563*   Basic Metabolic Panel: Recent Labs  Lab 09/14/17 1149 09/15/17 0408 09/16/17 0416 October 12, 2017 0427  NA 132* 135 139 141  K 4.3 4.0 4.6 3.8  CL 96* 102 108 110  CO2 19* 21* 23 22  GLUCOSE 109* 99 101* 104*  BUN 20 19 17 17   CREATININE 1.04* 0.87 0.80 0.71  CALCIUM 9.3 8.2* 8.3* 8.4*   GFR: Estimated Creatinine Clearance: 63 mL/min (by C-G formula based on SCr of 0.71 mg/dL). Liver Function Tests: Recent Labs  Lab 09/14/17 1149 09/15/17 0408 2017-10-12 0427  AST 66* 50* 20  ALT 51 40 25  ALKPHOS 162* 131* 100  BILITOT 1.6* 1.3* 1.1  PROT 7.4 6.1* 5.9*  ALBUMIN 3.1* 2.4* 2.4*   Recent Labs  Lab 09/14/17 1149  LIPASE 69*   No results for input(s): AMMONIA in the last 168 hours. Coagulation Profile: Recent Labs  Lab 10-12-17 0427  INR 1.51   Cardiac Enzymes: No results for input(s): CKTOTAL, CKMB, CKMBINDEX, TROPONINI in the last 168 hours. BNP (last 3 results) No results for input(s): PROBNP in the last 8760 hours. HbA1C: No results for input(s): HGBA1C in the last 72 hours. CBG: Recent Labs  Lab 09/15/17 0749 09/16/17 0747 09/16/17 2300 2017/10/12 0733  GLUCAP 91 104* 99 102*   Lipid Profile: No results for input(s): CHOL, HDL, LDLCALC, TRIG, CHOLHDL, LDLDIRECT in the last 72 hours. Thyroid Function Tests: No results for input(s): TSH, T4TOTAL, FREET4, T3FREE, THYROIDAB in the last 72 hours. Anemia Panel: No results for input(s): VITAMINB12, FOLATE, FERRITIN, TIBC, IRON, RETICCTPCT in the last 72 hours. Sepsis Labs: Recent Labs  Lab 09/14/17 2107 09/14/17 2343  LATICACIDVEN 1.6 0.9    Recent Results (from the past 240 hour(s))  Culture, Urine     Status: None   Collection Time: 09/14/17  4:30 PM  Result Value Ref Range Status    Specimen Description URINE, RANDOM  Final   Special Requests NONE  Final   Culture   Final    NO GROWTH Performed at [facility], [address] [place], [place] [address]    Report Status 09/16/2017 FINAL  Final         Radiology Studies: Ct Biopsy  Result Date: 10/12/17 CLINICAL DATA:  Pelvic mass, diffuse peritoneal carcinomatosis and ascites. Prior cytology of peritoneal fluid was not conclusive for gynecologic malignancy and suggested potential GI origin. Request has now been made to perform biopsy of peritoneal tumor for more definitive diagnosis. EXAM: CT GUIDED CORE BIOPSY OF PERITONEAL MASS COMPARISON:  CT of the abdomen and pelvis on 09/14/2017 and 09/03/2017 ANESTHESIA/SEDATION: 2.0 mg IV Versed; 100 mcg IV Fentanyl Total Moderate Sedation Time:  20 minutes. The patient's level of consciousness and physiologic status were continuously monitored during the procedure by Radiology nursing. PROCEDURE: The procedure risks, benefits, and alternatives were  explained to the patient. Questions regarding the procedure were encouraged and answered. The patient understands and consents to the procedure. The abdominal wall was prepped with chlorhexidine in Azlynn Mitnick sterile fashion, and Sheniqua Carolan sterile drape was applied covering the operative field. Merrilee Ancona sterile gown and sterile gloves were used for the procedure. Local anesthesia was provided with 1% Lidocaine. CT was performed in Natelie Ostrosky supine position. Under CT guidance, Curly Mackowski 79 gauge trocar needle was advanced from an anterior oblique position into the left anterior peritoneal cavity. Core biopsy was performed with an 18 gauge core biopsy needle device. Nakeitha Milligan total of 4 samples were obtained and submitted in formalin. Additional CT images were obtained after outer needle removal. COMPLICATIONS: None FINDINGS: Small bowel shows decompression since prior CT on 09/14/2017 after nasogastric decompression. This now allows for Roczen Waymire safe window to approach of peritoneal tumor  in the anterior peritoneal cavity/omental region. Tumor just deep to the left anterior abdominal wall was targeted and revealed solid tissue with biopsy. IMPRESSION: CT-guided core biopsy performed of peritoneal tumor. Electronically Signed   By: Aletta Edouard M.D.   On: 09/17/2017 16:26        Scheduled Meds: . enoxaparin (LOVENOX) injection  40 mg Subcutaneous Q24H  . fentaNYL      . midazolam      . phenytoin (DILANTIN) IV  100 mg Intravenous QHS   Continuous Infusions:   LOS: 2 days    Time spent: over 30 min    Fayrene Helper, MD [facility] [phone]  If 7PM-7AM, please contact night-coverage [email or web address] Password [email or web address] 09/17/2017, 9:07 PM

## 2017-09-17 NOTE — Sedation Documentation (Signed)
Patient denies pain and is resting comfortably.  

## 2017-09-17 NOTE — Plan of Care (Addendum)
Dr. Text to inform of possible 1/1 UA results and possible need for antibiotics. Dr. Asked to assess.   Dr. Annie Main text and indicated that since no growth with cultures - No antibiotics needed at this time.

## 2017-09-18 LAB — CBC
HEMATOCRIT: 42.1 % (ref 36.0–46.0)
HEMOGLOBIN: 13.3 g/dL (ref 12.0–15.0)
MCH: 28.2 pg (ref 26.0–34.0)
MCHC: 31.6 g/dL (ref 30.0–36.0)
MCV: 89.4 fL (ref 78.0–100.0)
Platelets: 398 10*3/uL (ref 150–400)
RBC: 4.71 MIL/uL (ref 3.87–5.11)
RDW: 14.6 % (ref 11.5–15.5)
WBC: 8.1 10*3/uL (ref 4.0–10.5)

## 2017-09-18 LAB — BASIC METABOLIC PANEL
ANION GAP: 9 (ref 5–15)
BUN: 16 mg/dL (ref 6–20)
CO2: 24 mmol/L (ref 22–32)
Calcium: 8.1 mg/dL — ABNORMAL LOW (ref 8.9–10.3)
Chloride: 109 mmol/L (ref 101–111)
Creatinine, Ser: 0.73 mg/dL (ref 0.44–1.00)
GFR calc Af Amer: >60 mL/min (ref >60)
GFR calc non Af Amer: >60 mL/min (ref >60)
Glucose, Bld: 101 mg/dL — ABNORMAL HIGH (ref 65–99)
POTASSIUM: 3.4 mmol/L — AB (ref 3.5–5.1)
Sodium: 142 mmol/L (ref 135–145)

## 2017-09-18 LAB — GLUCOSE, CAPILLARY: Glucose-Capillary: 98 mg/dL (ref 65–99)

## 2017-09-18 LAB — MAGNESIUM: Magnesium: 2.2 mg/dL (ref 1.7–2.4)

## 2017-09-18 LAB — PHOSPHORUS: Phosphorus: 2.6 mg/dL (ref 2.5–4.6)

## 2017-09-18 MED ORDER — POTASSIUM CHLORIDE 10 MEQ/100ML IV SOLN
10.0000 meq | INTRAVENOUS | Status: AC
  Administered 2017-09-18 (×2): 10 meq via INTRAVENOUS
  Filled 2017-09-18 (×2): qty 100

## 2017-09-18 NOTE — Progress Notes (Signed)
Hold tap water enema prep, now per Dr. Marcelline Deist. Colon xray Procedure on hold per Radiology and Surgery per am note received from [place].  SRP, RN

## 2017-09-18 NOTE — Progress Notes (Addendum)
Spoke with xray tech, X-ray tech discussed procedure with Radiologist and Dr. Harlow Asa, [facility] imaging canceled for today. Pt had tap water enema during the night. Will continue to monitor. SRP, RN

## 2017-09-18 NOTE — Progress Notes (Signed)
Patient ID: Christie Maynard, female   DOB: 1956/04/14, 62 y.o.   MRN: [id number] [facility] Surgery Progress Note:   * No surgery found *  Subjective: Mental status is clear;  NG in place Objective: Vital signs in last 24 hours: Temp:  [98.6 F (37 C)-98.7 F (37.1 C)] 98.7 F (37.1 C) (01/05 0616) Pulse Rate:  [85-100] 85 (01/05 0616) Resp:  [14-24] 18 (01/05 0616) BP: (105-146)/(66-97) 146/90 (01/05 0616) SpO2:  [95 %-100 %] 96 % (01/05 0616)  Intake/Output from previous day: 01/04 0701 - 01/05 0700 In: 628.8 [I.V.:628.8] Out: 1100 [Emesis/NG output:1100] Intake/Output this shift: No intake/output data recorded.  Physical Exam: Work of breathing is not labored.  Abdomen is nontender;  Small BM and flatus  Lab Results:  Results for orders placed or performed during the hospital encounter of 09/14/17 (from the past 48 hour(s))  Glucose, capillary     Status: None   Collection Time: 09/16/17 11:00 PM  Result Value Ref Range   Glucose-Capillary 99 65 - 99 mg/dL  CBC with Differential/Platelet     Status: Abnormal   Collection Time: 09/17/17  4:27 AM  Result Value Ref Range   WBC 7.7 4.0 - 10.5 K/uL   RBC 4.45 3.87 - 5.11 MIL/uL   Hemoglobin 12.9 12.0 - 15.0 g/dL   HCT 39.4 36.0 - 46.0 %   MCV 88.5 78.0 - 100.0 fL   MCH 29.0 26.0 - 34.0 pg   MCHC 32.7 30.0 - 36.0 g/dL   RDW 14.6 11.5 - 15.5 %   Platelets 401 (H) 150 - 400 K/uL   Neutrophils Relative % 73 %   Neutro Abs 5.6 1.7 - 7.7 K/uL   Lymphocytes Relative 13 %   Lymphs Abs 1.0 0.7 - 4.0 K/uL   Monocytes Relative 14 %   Monocytes Absolute 1.1 (H) 0.1 - 1.0 K/uL   Eosinophils Relative 0 %   Eosinophils Absolute 0.0 0.0 - 0.7 K/uL   Basophils Relative 0 %   Basophils Absolute 0.0 0.0 - 0.1 K/uL  Protime-INR     Status: Abnormal   Collection Time: 09/17/17  4:27 AM  Result Value Ref Range   Prothrombin Time 18.1 (H) 11.4 - 15.2 seconds   INR 1.51   Comprehensive metabolic panel     Status: Abnormal    Collection Time: 09/17/17  4:27 AM  Result Value Ref Range   Sodium 141 135 - 145 mmol/L   Potassium 3.8 3.5 - 5.1 mmol/L    Comment: DELTA CHECK NOTED REPEATED TO VERIFY    Chloride 110 101 - 111 mmol/L   CO2 22 22 - 32 mmol/L   Glucose, Bld 104 (H) 65 - 99 mg/dL   BUN 17 6 - 20 mg/dL   Creatinine, Ser 0.71 0.44 - 1.00 mg/dL   Calcium 8.4 (L) 8.9 - 10.3 mg/dL   Total Protein 5.9 (L) 6.5 - 8.1 g/dL   Albumin 2.4 (L) 3.5 - 5.0 g/dL   AST 20 15 - 41 U/L   ALT 25 14 - 54 U/L   Alkaline Phosphatase 100 38 - 126 U/L   Total Bilirubin 1.1 0.3 - 1.2 mg/dL   GFR calc non Af Amer >60 >60 mL/min   GFR calc Af Amer >60 >60 mL/min    Comment: (NOTE) The eGFR has been calculated using the CKD EPI equation. This calculation has not been validated in all clinical situations. eGFR's persistently <60 mL/min signify possible Chronic Kidney Disease.  Anion gap 9 5 - 15  Glucose, capillary     Status: Abnormal   Collection Time: 09/17/17  7:33 AM  Result Value Ref Range   Glucose-Capillary 102 (H) 65 - 99 mg/dL  CBC     Status: None   Collection Time: 09/18/17  4:03 AM  Result Value Ref Range   WBC 8.1 4.0 - 10.5 K/uL   RBC 4.71 3.87 - 5.11 MIL/uL   Hemoglobin 13.3 12.0 - 15.0 g/dL   HCT 42.1 36.0 - 46.0 %   MCV 89.4 78.0 - 100.0 fL   MCH 28.2 26.0 - 34.0 pg   MCHC 31.6 30.0 - 36.0 g/dL   RDW 14.6 11.5 - 15.5 %   Platelets 398 150 - 400 K/uL  Magnesium     Status: None   Collection Time: 09/18/17  4:03 AM  Result Value Ref Range   Magnesium 2.2 1.7 - 2.4 mg/dL  Phosphorus     Status: None   Collection Time: 09/18/17  4:03 AM  Result Value Ref Range   Phosphorus 2.6 2.5 - 4.6 mg/dL  Basic metabolic panel     Status: Abnormal   Collection Time: 09/18/17  4:03 AM  Result Value Ref Range   Sodium 142 135 - 145 mmol/L   Potassium 3.4 (L) 3.5 - 5.1 mmol/L   Chloride 109 101 - 111 mmol/L   CO2 24 22 - 32 mmol/L   Glucose, Bld 101 (H) 65 - 99 mg/dL   BUN 16 6 - 20 mg/dL    Creatinine, Ser 0.73 0.44 - 1.00 mg/dL   Calcium 8.1 (L) 8.9 - 10.3 mg/dL   GFR calc non Af Amer >60 >60 mL/min   GFR calc Af Amer >60 >60 mL/min    Comment: (NOTE) The eGFR has been calculated using the CKD EPI equation. This calculation has not been validated in all clinical situations. eGFR's persistently <60 mL/min signify possible Chronic Kidney Disease.    Anion gap 9 5 - 15  Glucose, capillary     Status: None   Collection Time: 09/18/17  7:48 AM  Result Value Ref Range   Glucose-Capillary 98 65 - 99 mg/dL    Radiology/Results: Ct Biopsy  Result Date: 09/17/2017 CLINICAL DATA:  Pelvic mass, diffuse peritoneal carcinomatosis and ascites. Prior cytology of peritoneal fluid was not conclusive for gynecologic malignancy and suggested potential GI origin. Request has now been made to perform biopsy of peritoneal tumor for more definitive diagnosis. EXAM: CT GUIDED CORE BIOPSY OF PERITONEAL MASS COMPARISON:  CT of the abdomen and pelvis on 09/14/2017 and 09/03/2017 ANESTHESIA/SEDATION: 2.0 mg IV Versed; 100 mcg IV Fentanyl Total Moderate Sedation Time:  20 minutes. The patient's level of consciousness and physiologic status were continuously monitored during the procedure by Radiology nursing. PROCEDURE: The procedure risks, benefits, and alternatives were explained to the patient. Questions regarding the procedure were encouraged and answered. The patient understands and consents to the procedure. The abdominal wall was prepped with chlorhexidine in a sterile fashion, and a sterile drape was applied covering the operative field. A sterile gown and sterile gloves were used for the procedure. Local anesthesia was provided with 1% Lidocaine. CT was performed in a supine position. Under CT guidance, a 33 gauge trocar needle was advanced from an anterior oblique position into the left anterior peritoneal cavity. Core biopsy was performed with an 18 gauge core biopsy needle device. A total of 4  samples were obtained and submitted in formalin. Additional CT images were  obtained after outer needle removal. COMPLICATIONS: None FINDINGS: Small bowel shows decompression since prior CT on 09/14/2017 after nasogastric decompression. This now allows for a safe window to approach of peritoneal tumor in the anterior peritoneal cavity/omental region. Tumor just deep to the left anterior abdominal wall was targeted and revealed solid tissue with biopsy. IMPRESSION: CT-guided core biopsy performed of peritoneal tumor. Electronically Signed   By: Aletta Edouard M.D.   On: 09/17/2017 16:26    Anti-infectives: Anti-infectives (From admission, onward)   None      Assessment/Plan: Problem List: Patient Active Problem List   Diagnosis Date Noted  . Nausea and vomiting 09/15/2017  . Nausea & vomiting 09/14/2017  . Peritoneal carcinomatosis ([place]) 09/14/2017  . History of seizures 09/14/2017  . Protein-calorie malnutrition, severe ([place]) 09/08/2017  . Ovarian mass 09/05/2017  . Other ascites 09/03/2017  . Ascites 09/03/2017    Soluble contrast enema was held;  Will need to discuss strategy with the family;  May consider TNA temporarily.  Path of percutaneous biopsy is pending.  * No surgery found *    LOS: 3 days   Matt B. Hassell Done, MD, [facility] [phone] beeper [phone]  09/18/2017 11:13 AM

## 2017-09-18 NOTE — Progress Notes (Signed)
Pt sat in chair for about 2 hrs tol well.SRP, RN

## 2017-09-18 NOTE — Progress Notes (Signed)
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 Patient reports poor oral intake and unable to eat solid foods for past few weeks but 24 hours prior to this admission, she felt Christie Maynard lot worse, bloated, uncomfortable. Off note patient had appointment with gastroenterologist scheduled for 09/15/2017 due to cytology results concerning for adenocarcinoma.  Assessment & Plan:   Principal Problem:   Nausea & vomiting Active Problems:   Ascites   Peritoneal carcinomatosis (HCC)   History of seizures   Nausea and vomiting  Peritoneal Carcinomatosis  Ascites  Nausea and vomiting  Small Bowel Obstruction: Primary unclear.  Pt was following with Dr. 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  Plan was for carboplatin/paclitaxel x 3 cycles followed by debulking, but it looks like cytology from paracentesis on 12/22 has come back atypical cells suspicious for adenocarcinoma of GI tract and Nafeesah Lapaglia GI consult was pending.   - CT abdomen/pelvis with contrast with progressing SBO  - NGT to LIS - appreciate GI c/s (elevated CEA and biopsy results pending, considering endoscopy - looks like plan for water soluble contrast enema per Dr. Oletta Lamas on Friday, but for unknown reason this was canceled.  Unfortunately neither Dr. Harlow Asa or Dr. Oletta Lamas here today, will need to discuss again with surgery tomorrow) - appreciate surgery c/s - appreciate gyn onc c/s (primary malignancy unclear at this point - s/p biopsy by IR - if persistent obstruction, may need parenteral nutrition)  - onc is aware, awaiting bx results - she may need TPA - antiemetics (QT at presentation  appropriate)  Hypokalemia: f/u mag, replete prn  Anion gap metabolic acidosis: improved  L ureteral obstruction s/p stent placement: Follow creatinine  Abnormal UA: urine cx negatve  Leukocytosis: improved  Mildly elevated liver enzymes: improved  Mildly elevated lipase: imaging without pancreatitis  History of seizures: has had 1 seizure in 1980's, none since.  Stable on dilantin. Will give IV dilantin while NPO.  DVT prophylaxis: lovenox Code Status: full  Family Communication: none at bedside Disposition Plan: pending   Consultants:   Gyn onc  Surgery  GI  Procedures:   NG tube placement  Antimicrobials: (specify start and planned stop date. Auto populated tables are space occupying and do not give end dates) Anti-infectives (From admission, onward)   None         Subjective: No complaints  Objective: Vitals:   09/17/17 1421 09/17/17 2137 09/18/17 0616 09/18/17 1300  BP:  (!) 140/97 (!) 146/90 (!) 155/93  Pulse:  100 85 99  Resp:  18 18 18   Temp:  98.6 F (37 C) 98.7 F (37.1 C) 97.6 F (36.4 C)  TempSrc:  Oral Oral Oral  SpO2: 95% 97% 96% 97%  Weight:      Height:        Intake/Output Summary (Last 24 hours) at 09/18/2017 1958 Last data filed at 09/18/2017 1600 Gross per 24 hour  Intake 1566.25 ml  Output 1350 ml  Net 216.25 ml   Filed Weights   09/14/17 1127 09/14/17 2110  Weight: 68 kg (150 lb) 63.4 kg (139 lb 12.8 oz)    Examination:  General: No acute distress. Cardiovascular: Heart sounds show Tanya Marvin regular  rate, and rhythm. No gallops or rubs. No murmurs. No JVD. Lungs: Clear to auscultation bilaterally with good air movement. No rales, rhonchi or wheezes. Abdomen: Soft, nontender, quiet  Bowel sounds. No masses. No hepatosplenomegaly. Neurological: Alert and oriented 3. Moves all extremities 4 with equal strength. Cranial nerves II through XII grossly intact. Skin: Warm and dry. No rashes or lesions. Extremities: No  clubbing or cyanosis. No edema. Psychiatric: Mood and affect are normal. Insight and judgment are appropriate.  Data Reviewed: I have personally reviewed following labs and imaging studies  CBC: Recent Labs  Lab 09/14/17 1149 09/15/17 0408 09/16/17 0416 2017/10/07 0427 09/18/17 0403  WBC 14.2* 8.7 8.0 7.7 8.1  NEUTROABS  --   --   --  5.6  --   HGB 14.7 13.0 13.7 12.9 13.3  HCT 43.6 40.3 42.1 39.4 42.1  MCV 86.2 86.9 88.8 88.5 89.4  PLT 446* 402* 407* 401* 756   Basic Metabolic Panel: Recent Labs  Lab 09/14/17 1149 09/15/17 0408 09/16/17 0416 Oct 07, 2017 0427 09/18/17 0403  NA 132* 135 139 141 142  K 4.3 4.0 4.6 3.8 3.4*  CL 96* 102 108 110 109  CO2 19* 21* 23 22 24   GLUCOSE 109* 99 101* 104* 101*  BUN 20 19 17 17 16   CREATININE 1.04* 0.87 0.80 0.71 0.73  CALCIUM 9.3 8.2* 8.3* 8.4* 8.1*  MG  --   --   --   --  2.2  PHOS  --   --   --   --  2.6   GFR: Estimated Creatinine Clearance: 63 mL/min (by C-G formula based on SCr of 0.73 mg/dL). Liver Function Tests: Recent Labs  Lab 09/14/17 1149 09/15/17 0408 10/07/2017 0427  AST 66* 50* 20  ALT 51 40 25  ALKPHOS 162* 131* 100  BILITOT 1.6* 1.3* 1.1  PROT 7.4 6.1* 5.9*  ALBUMIN 3.1* 2.4* 2.4*   Recent Labs  Lab 09/14/17 1149  LIPASE 69*   No results for input(s): AMMONIA in the last 168 hours. Coagulation Profile: Recent Labs  Lab 07-Oct-2017 0427  INR 1.51   Cardiac Enzymes: No results for input(s): CKTOTAL, CKMB, CKMBINDEX, TROPONINI in the last 168 hours. BNP (last 3 results) No results for input(s): PROBNP in the last 8760 hours. HbA1C: No results for input(s): HGBA1C in the last 72 hours. CBG: Recent Labs  Lab 09/15/17 0749 09/16/17 0747 09/16/17 2300 2017/10/07 0733 09/18/17 0748  GLUCAP 91 104* 99 102* 98   Lipid Profile: No results for input(s): CHOL, HDL, LDLCALC, TRIG, CHOLHDL, LDLDIRECT in the last 72 hours. Thyroid Function Tests: No results for input(s): TSH, T4TOTAL, FREET4, T3FREE,  THYROIDAB in the last 72 hours. Anemia Panel: No results for input(s): VITAMINB12, FOLATE, FERRITIN, TIBC, IRON, RETICCTPCT in the last 72 hours. Sepsis Labs: Recent Labs  Lab 09/14/17 2107 09/14/17 2343  LATICACIDVEN 1.6 0.9    Recent Results (from the past 240 hour(s))  Culture, Urine     Status: None   Collection Time: 09/14/17  4:30 PM  Result Value Ref Range Status   Specimen Description URINE, RANDOM  Final   Special Requests NONE  Final   Culture   Final    NO GROWTH Performed at [facility], [address], [place], [place] [address]    Report Status 09/16/2017 FINAL  Final         Radiology Studies: Ct Biopsy  Result Date: 10/07/17 CLINICAL DATA:  Pelvic mass, diffuse peritoneal carcinomatosis and ascites. Prior cytology  of peritoneal fluid was not conclusive for gynecologic malignancy and suggested potential GI origin. Request has now been made to perform biopsy of peritoneal tumor for more definitive diagnosis. EXAM: CT GUIDED CORE BIOPSY OF PERITONEAL MASS COMPARISON:  CT of the abdomen and pelvis on 09/14/2017 and 09/03/2017 ANESTHESIA/SEDATION: 2.0 mg IV Versed; 100 mcg IV Fentanyl Total Moderate Sedation Time:  20 minutes. The patient's level of consciousness and physiologic status were continuously monitored during the procedure by Radiology nursing. PROCEDURE: The procedure risks, benefits, and alternatives were explained to the patient. Questions regarding the procedure were encouraged and answered. The patient understands and consents to the procedure. The abdominal wall was prepped with chlorhexidine in Chisa Kushner sterile fashion, and Lyra Alaimo sterile drape was applied covering the operative field. Marlina Cataldi sterile gown and sterile gloves were used for the procedure. Local anesthesia was provided with 1% Lidocaine. CT was performed in Kaitlyn Skowron supine position. Under CT guidance, Kenae Lindquist 58 gauge trocar needle was advanced from an anterior oblique position into the left anterior peritoneal  cavity. Core biopsy was performed with an 18 gauge core biopsy needle device. Jerry Clyne total of 4 samples were obtained and submitted in formalin. Additional CT images were obtained after outer needle removal. COMPLICATIONS: None FINDINGS: Small bowel shows decompression since prior CT on 09/14/2017 after nasogastric decompression. This now allows for Dieter Hane safe window to approach of peritoneal tumor in the anterior peritoneal cavity/omental region. Tumor just deep to the left anterior abdominal wall was targeted and revealed solid tissue with biopsy. IMPRESSION: CT-guided core biopsy performed of peritoneal tumor. Electronically Signed   By: Aletta Edouard M.D.   On: 09/17/2017 16:26        Scheduled Meds: . enoxaparin (LOVENOX) injection  40 mg Subcutaneous Q24H  . phenytoin (DILANTIN) IV  100 mg Intravenous QHS   Continuous Infusions: . sodium chloride 75 mL/hr at 09/18/17 1716     LOS: 3 days    Time spent: over 30 min    Fayrene Helper, MD [facility] [phone]  If 7PM-7AM, please contact night-coverage [email or web address] Password [facility] 09/18/2017, 7:58 PM

## 2017-09-19 LAB — CBC
HEMATOCRIT: 41.8 % (ref 36.0–46.0)
HEMOGLOBIN: 13.3 g/dL (ref 12.0–15.0)
MCH: 28.4 pg (ref 26.0–34.0)
MCHC: 31.8 g/dL (ref 30.0–36.0)
MCV: 89.3 fL (ref 78.0–100.0)
Platelets: 394 10*3/uL (ref 150–400)
RBC: 4.68 MIL/uL (ref 3.87–5.11)
RDW: 14.7 % (ref 11.5–15.5)
WBC: 7.9 10*3/uL (ref 4.0–10.5)

## 2017-09-19 LAB — BASIC METABOLIC PANEL
Anion gap: 8 (ref 5–15)
BUN: 14 mg/dL (ref 6–20)
CHLORIDE: 110 mmol/L (ref 101–111)
CO2: 25 mmol/L (ref 22–32)
CREATININE: 0.7 mg/dL (ref 0.44–1.00)
Calcium: 8.1 mg/dL — ABNORMAL LOW (ref 8.9–10.3)
GFR calc non Af Amer: >60 mL/min (ref >60)
GLUCOSE: 98 mg/dL (ref 65–99)
Potassium: 4.3 mmol/L (ref 3.5–5.1)
Sodium: 143 mmol/L (ref 135–145)

## 2017-09-19 LAB — GLUCOSE, CAPILLARY
GLUCOSE-CAPILLARY: 87 mg/dL (ref 65–99)
GLUCOSE-CAPILLARY: 120 mg/dL — AB (ref 65–99)

## 2017-09-19 LAB — MAGNESIUM: Magnesium: 2.2 mg/dL (ref 1.7–2.4)

## 2017-09-19 LAB — PHOSPHORUS: PHOSPHORUS: 2.3 mg/dL — AB (ref 2.5–4.6)

## 2017-09-19 MED ORDER — SODIUM CHLORIDE 0.9 % IV SOLN
INTRAVENOUS | Status: DC
  Administered 2017-09-19 – 2017-09-20 (×2): via INTRAVENOUS

## 2017-09-19 MED ORDER — FAT EMULSION 20 % IV EMUL
120.0000 mL | INTRAVENOUS | Status: AC
  Administered 2017-09-19: 120 mL via INTRAVENOUS
  Filled 2017-09-19: qty 200

## 2017-09-19 MED ORDER — TRACE MINERALS CR-CU-MN-SE-ZN 10-1000-500-60 MCG/ML IV SOLN
INTRAVENOUS | Status: AC
  Administered 2017-09-19: 18:00:00 via INTRAVENOUS
  Filled 2017-09-19: qty 720

## 2017-09-19 MED ORDER — INSULIN ASPART 100 UNIT/ML ~~LOC~~ SOLN
0.0000 [IU] | Freq: Four times a day (QID) | SUBCUTANEOUS | Status: DC
  Administered 2017-09-20 (×4): 1 [IU] via SUBCUTANEOUS
  Administered 2017-09-21: 2 [IU] via SUBCUTANEOUS
  Administered 2017-09-21 (×2): 1 [IU] via SUBCUTANEOUS
  Administered 2017-09-22: 2 [IU] via SUBCUTANEOUS
  Administered 2017-09-22 – 2017-09-23 (×3): 1 [IU] via SUBCUTANEOUS
  Administered 2017-09-24 (×2): 2 [IU] via SUBCUTANEOUS
  Administered 2017-09-24: 3 [IU] via SUBCUTANEOUS
  Administered 2017-09-24: 2 [IU] via SUBCUTANEOUS
  Administered 2017-09-25: 1 [IU] via SUBCUTANEOUS
  Administered 2017-09-25: 2 [IU] via SUBCUTANEOUS
  Administered 2017-09-25 – 2017-09-27 (×5): 1 [IU] via SUBCUTANEOUS

## 2017-09-19 MED ORDER — SODIUM GLYCEROPHOSPHATE 1 MMOLE/ML IV SOLN
20.0000 mmol | Freq: Once | INTRAVENOUS | Status: AC
  Administered 2017-09-19: 20 mmol via INTRAVENOUS
  Filled 2017-09-19: qty 20

## 2017-09-19 MED ORDER — SODIUM CHLORIDE 0.9% FLUSH
10.0000 mL | INTRAVENOUS | Status: DC | PRN
  Administered 2017-09-20: 10 mL
  Administered 2017-09-23: 20 mL
  Administered 2017-09-24 – 2017-09-29 (×3): 10 mL
  Administered 2017-09-29: 20 mL
  Administered 2017-10-02: 10 mL
  Filled 2017-09-19 (×7): qty 40

## 2017-09-19 NOTE — Progress Notes (Signed)
PHARMACY - ADULT TOTAL PARENTERAL NUTRITION CONSULT NOTE   Pharmacy Consult for TPN Indication: bowel obstruction  Patient Measurements: Height: 5\' 2"  (157.5 cm) Weight: 139 lb 12.8 oz (63.4 kg) IBW/kg (Calculated) : 50.1 TPN AdjBW (KG): 53.4 Body mass index is 25.57 kg/m. Usual Weight: 68 kg  Insulin Requirements: none ordered  Current Nutrition: NPO  IVF: NS at 39  Central access: PICC placed 1/6 TPN start date: 1/6  ASSESSMENT                                                                                                          HPI: Pt with PMH of seizures and recently diagnosed peritoneal carcinomatosis with bowel obstruction/colonic obstruction secondary to malignancy of unknown primary presents with N/V, admitted for further management/evaluation. CT abdomen/pelviswith contrast shows progressing SBO. Having BMs but with 1450 ml from NGT, Surgery recommends starting TPN. Patient with significant recent weight loss but primarily d/t multiple large-volume paracentesis. Poor intake since week before Xmas, and unable to keep anything down since 12/31. Appears to be moderate risk for refeeding.  Significant events:   Today:   Glucose - WNL, no lows  Electrolytes - Phos slightly low, o/w WNL. Potassium repleted yesterday  Renal - SCr stable WNL  LFTs - slightly elevated on admit, now WNL  TGs - pending  Prealbumin - pending  NUTRITIONAL GOALS                                                                                             RD recs (09/15/17):  Kcal:  1700-1900 Protein:  80-90 grams Fluid:  >/= 1.7 L/d  Clinimix 5/15 at a goal rate of 75 ml/hr + 20% fat emulsion 240 ml to provide: 90 g/day protein, 1758 Kcal/day.  PLAN                                                                                                                         Glycophos 20 mmol IV x 1  At 1800 today:  Start Clinimix E 5/15 at 30 ml/hr  20% fat emulsion at reduced rate of 10  ml/hr x 12 hr  Plan to advance as  tolerated to the goal rate  TPN to contain standard multivitamins and trace elements  Reduce IVF to 50 ml/hr  Add sensitive SSI with q6h CBG checks  TPN lab panels on Mondays & Thursdays  F/u daily  Reuel Boom, PharmD, BCPS Pager: [phone] 09/19/2017, 10:25 AM

## 2017-09-19 NOTE — Progress Notes (Signed)
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 Patient reports poor oral intake and unable to eat solid foods for past few weeks but 24 hours prior to this admission, she felt a lot worse, bloated, uncomfortable. Off note patient had appointment with gastroenterologist scheduled for 09/15/2017 due to cytology results concerning for adenocarcinoma.  Assessment & Plan:   Principal Problem:   Nausea & vomiting Active Problems:   Ascites   Peritoneal carcinomatosis (HCC)   History of seizures   Nausea and vomiting  Peritoneal Carcinomatosis  Ascites  Nausea and vomiting  Small Bowel Obstruction: Primary unclear.  Pt was following with Dr. 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  Plan was for carboplatin/paclitaxel x 3 cycles followed by debulking, but it looks like cytology from paracentesis on 12/22 has come back atypical cells suspicious for adenocarcinoma of GI tract and a GI consult was pending.   - CT abdomen/pelvis with contrast with progressing SBO  - NGT to LIS - she's having BM, but pt with 1.45 L out over past 24 hours, discussed with surgery and will go ahead and start TPN - appreciate GI c/s (elevated CEA and biopsy results pending, considering endoscopy - looks like plan for water soluble contrast enema per Dr. Oletta Lamas on Friday, but for unknown reason this was canceled.  Unfortunately neither Dr. Harlow Asa or Dr. Oletta Lamas here today, will discuss need for this going forward) - appreciate surgery c/s - appreciate gyn onc c/s (primary malignancy unclear at this point - s/p biopsy by IR - if persistent obstruction, may need  parenteral nutrition)  - onc is aware, awaiting bx results - she may need TPA - antiemetics (QT at presentation appropriate)  Hypokalemia: f/u mag, replete prn  Anion gap metabolic acidosis: improved  L ureteral obstruction s/p stent placement: Follow creatinine  Abnormal UA: urine cx negatve  Leukocytosis: improved  Mildly elevated liver enzymes: improved  Mildly elevated lipase: imaging without pancreatitis  History of seizures: has had 1 seizure in 1980's, none since.  Stable on dilantin. Will give IV dilantin while NPO.  DVT prophylaxis: lovenox Code Status: full  Family Communication: none at bedside Disposition Plan: pending   Consultants:   Gyn onc  Surgery  GI  Procedures:   NG tube placement  Antimicrobials: (specify start and planned stop date. Auto populated tables are space occupying and do not give end dates) Anti-infectives (From admission, onward)   None         Subjective: No complaints  Objective: Vitals:   09/18/17 0616 09/18/17 1300 09/18/17 2133 09/19/17 0615  BP: (!) 146/90 (!) 155/93 (!) 147/94 (!) 144/98  Pulse: 85 99 90 81  Resp: 18 18 18 18   Temp: 98.7 F (37.1 C) 97.6 F (36.4 C) 98 F (36.7 C) 98.2 F (36.8 C)  TempSrc: Oral Oral Oral Oral  SpO2: 96% 97% 97% 100%  Weight:      Height:        Intake/Output Summary (Last 24 hours) at 09/19/2017 0834 Last data filed at 09/19/2017 0600 Gross per 24 hour  Intake 2307.5 ml  Output 1450 ml  Net 857.5 ml   Filed Weights   09/14/17 1127 09/14/17 2110  Weight: 68 kg (150 lb) 63.4 kg (139 lb 12.8 oz)    Examination:  General: No acute distress. Cardiovascular: Heart sounds show a regular rate, and rhythm. No gallops or rubs. No murmurs. No JVD. Lungs: Clear to auscultation bilaterally with good air movement. No rales, rhonchi or wheezes. Abdomen: Soft, nontender, nondistended with quiet bowel sounds. No masses. No hepatosplenomegaly. Neurological: Alert and  oriented 3. Moves all extremities 4 with equal strength. Cranial nerves II through XII grossly intact. Skin: Warm and dry. No rashes or lesions. Extremities: No clubbing or cyanosis. No edema.  Psychiatric: Mood and affect are normal. Insight and judgment are appropriate.  Data Reviewed: I have personally reviewed following labs and imaging studies  CBC: Recent Labs  Lab 09/15/17 0408 09/16/17 0416 09/17/17 0427 09/18/17 0403 09/19/17 0548  WBC 8.7 8.0 7.7 8.1 7.9  NEUTROABS  --   --  5.6  --   --   HGB 13.0 13.7 12.9 13.3 13.3  HCT 40.3 42.1 39.4 42.1 41.8  MCV 86.9 88.8 88.5 89.4 89.3  PLT 402* 407* 401* 398 253   Basic Metabolic Panel: Recent Labs  Lab 09/15/17 0408 09/16/17 0416 09/17/17 0427 09/18/17 0403 09/19/17 0548  NA 135 139 141 142 143  K 4.0 4.6 3.8 3.4* 4.3  CL 102 108 110 109 110  CO2 21* 23 22 24 25   GLUCOSE 99 101* 104* 101* 98  BUN 19 17 17 16 14   CREATININE 0.87 0.80 0.71 0.73 0.70  CALCIUM 8.2* 8.3* 8.4* 8.1* 8.1*  MG  --   --   --  2.2 2.2  PHOS  --   --   --  2.6 2.3*   GFR: Estimated Creatinine Clearance: 63 mL/min (by C-G formula based on SCr of 0.7 mg/dL). Liver Function Tests: Recent Labs  Lab 09/14/17 1149 09/15/17 0408 09/17/17 0427  AST 66* 50* 20  ALT 51 40 25  ALKPHOS 162* 131* 100  BILITOT 1.6* 1.3* 1.1  PROT 7.4 6.1* 5.9*  ALBUMIN 3.1* 2.4* 2.4*   Recent Labs  Lab 09/14/17 1149  LIPASE 69*   No results for input(s): AMMONIA in the last 168 hours. Coagulation Profile: Recent Labs  Lab 09/17/17 0427  INR 1.51   Cardiac Enzymes: No results for input(s): CKTOTAL, CKMB, CKMBINDEX, TROPONINI in the last 168 hours. BNP (last 3 results) No results for input(s): PROBNP in the last 8760 hours. HbA1C: No results for input(s): HGBA1C in the last 72 hours. CBG: Recent Labs  Lab 09/16/17 0747 09/16/17 2300 09/17/17 0733 09/18/17 0748 09/19/17 0721  GLUCAP 104* 99 102* 98 87   Lipid Profile: No results for  input(s): CHOL, HDL, LDLCALC, TRIG, CHOLHDL, LDLDIRECT in the last 72 hours. Thyroid Function Tests: No results for input(s): TSH, T4TOTAL, FREET4, T3FREE, THYROIDAB in the last 72 hours. Anemia Panel: No results for input(s): VITAMINB12, FOLATE, FERRITIN, TIBC, IRON, RETICCTPCT in the last 72 hours. Sepsis Labs: Recent Labs  Lab 09/14/17 2107 09/14/17 2343  LATICACIDVEN 1.6 0.9    Recent Results (from the past 240 hour(s))  Culture, Urine     Status: None   Collection Time: 09/14/17  4:30 PM  Result Value Ref Range Status   Specimen Description URINE, RANDOM  Final   Special Requests NONE  Final   Culture   Final    NO GROWTH Performed at [facility], [address], [place], [place] [address]    Report Status 09/16/2017 FINAL  Final  Radiology Studies: Ct Biopsy  Result Date: 09/29/2017 CLINICAL DATA:  Pelvic mass, diffuse peritoneal carcinomatosis and ascites. Prior cytology of peritoneal fluid was not conclusive for gynecologic malignancy and suggested potential GI origin. Request has now been made to perform biopsy of peritoneal tumor for more definitive diagnosis. EXAM: CT GUIDED CORE BIOPSY OF PERITONEAL MASS COMPARISON:  CT of the abdomen and pelvis on 09/14/2017 and 09/03/2017 ANESTHESIA/SEDATION: 2.0 mg IV Versed; 100 mcg IV Fentanyl Total Moderate Sedation Time:  20 minutes. The patient's level of consciousness and physiologic status were continuously monitored during the procedure by Radiology nursing. PROCEDURE: The procedure risks, benefits, and alternatives were explained to the patient. Questions regarding the procedure were encouraged and answered. The patient understands and consents to the procedure. The abdominal wall was prepped with chlorhexidine in a sterile fashion, and a sterile drape was applied covering the operative field. A sterile gown and sterile gloves were used for the procedure. Local anesthesia was provided with 1% Lidocaine. CT was  performed in a supine position. Under CT guidance, a 50 gauge trocar needle was advanced from an anterior oblique position into the left anterior peritoneal cavity. Core biopsy was performed with an 18 gauge core biopsy needle device. A total of 4 samples were obtained and submitted in formalin. Additional CT images were obtained after outer needle removal. COMPLICATIONS: None FINDINGS: Small bowel shows decompression since prior CT on 09/14/2017 after nasogastric decompression. This now allows for a safe window to approach of peritoneal tumor in the anterior peritoneal cavity/omental region. Tumor just deep to the left anterior abdominal wall was targeted and revealed solid tissue with biopsy. IMPRESSION: CT-guided core biopsy performed of peritoneal tumor. Electronically Signed   By: Aletta Edouard M.D.   On: 09-29-17 16:26        Scheduled Meds: . enoxaparin (LOVENOX) injection  40 mg Subcutaneous Q24H  . phenytoin (DILANTIN) IV  100 mg Intravenous QHS   Continuous Infusions: . sodium chloride 75 mL/hr at 09/19/17 0533     LOS: 4 days    Time spent: over 30 min    Fayrene Helper, MD [facility] [phone]  If 7PM-7AM, please contact night-coverage [email or web address] Password [facility] 09/19/2017, 8:34 AM

## 2017-09-19 NOTE — Progress Notes (Signed)
Peripherally Inserted Central Catheter/Midline Placement  The IV Nurse has discussed with the patient and/or persons authorized to consent for the patient, the purpose of this procedure and the potential benefits and risks involved with this procedure.  The benefits include less needle sticks, lab draws from the catheter, and the patient may be discharged home with the catheter. Risks include, but not limited to, infection, bleeding, blood clot (thrombus formation), and puncture of an artery; nerve damage and irregular heartbeat and possibility to perform a PICC exchange if needed/ordered by physician.  Alternatives to this procedure were also discussed.  Bard Power PICC patient education guide, fact sheet on infection prevention and patient information card has been provided to patient /or left at bedside.    PICC/Midline Placement Documentation  PICC Double Lumen 09/19/17 PICC Right Brachial 36 cm 2 cm (Active)  Indication for Insertion or Continuance of Line Administration of hyperosmolar/irritating solutions (i.e. TPN, Vancomycin, etc.) 09/19/2017 11:46 AM  Exposed Catheter (cm) 2 cm 09/19/2017 11:46 AM  Site Assessment Clean;Dry;Intact 09/19/2017 11:46 AM  Lumen #1 Status Flushed;Saline locked;Blood return noted 09/19/2017 11:46 AM  Lumen #2 Status Flushed;Saline locked;Blood return noted 09/19/2017 11:46 AM  Dressing Type Transparent 09/19/2017 11:46 AM  Dressing Status Clean;Dry;Intact;Antimicrobial disc in place 09/19/2017 11:46 AM  Line Care Connections checked and tightened 09/19/2017 11:46 AM  Line Adjustment (NICU/IV Team Only) No 09/19/2017 11:46 AM  Dressing Intervention New dressing 09/19/2017 11:46 AM  Dressing Change Due 09/26/17 09/19/2017 11:46 AM       Rolena Infante 09/19/2017, 11:47 AM

## 2017-09-19 NOTE — Progress Notes (Signed)
PT Cancellation Note  Patient Details Name: LOREAN EKSTRAND MRN: [id number] DOB: Apr 10, 1956   Cancelled Treatment:    Reason Eval/Treat Not Completed: Other (comment)recently ambulated to BR. Patient  Declined due to TPN to start.    Claretha Cooper 09/19/2017, 5:17 PM  Tresa Endo PT [phone]

## 2017-09-19 NOTE — Progress Notes (Signed)
Patient ID: Christie Maynard, female   DOB: Jan 25, 1956, 62 y.o.   MRN: [id number] [facility] Surgery Progress Note:   * No surgery found *  Subjective: Mental status is clear and comfortable Objective: Vital signs in last 24 hours: Temp:  [97.6 F (36.4 C)-98.2 F (36.8 C)] 98.2 F (36.8 C) (01/06 0615) Pulse Rate:  [81-99] 81 (01/06 0615) Resp:  [18] 18 (01/06 0615) BP: (144-155)/(93-98) 144/98 (01/06 0615) SpO2:  [97 %-100 %] 100 % (01/06 0615)  Intake/Output from previous day: 01/05 0701 - 01/06 0700 In: 2307.5 [P.O.:320; I.V.:1787.5; IV Piggyback:200] Out: 1450 [Emesis/NG output:1450] Intake/Output this shift: No intake/output data recorded.  Physical Exam: Work of breathing is normal;  NG in place but she is passing flatus and had a BM.    Lab Results:  Results for orders placed or performed during the hospital encounter of 09/14/17 (from the past 48 hour(s))  CBC     Status: None   Collection Time: 09/18/17  4:03 AM  Result Value Ref Range   WBC 8.1 4.0 - 10.5 K/uL   RBC 4.71 3.87 - 5.11 MIL/uL   Hemoglobin 13.3 12.0 - 15.0 g/dL   HCT 42.1 36.0 - 46.0 %   MCV 89.4 78.0 - 100.0 fL   MCH 28.2 26.0 - 34.0 pg   MCHC 31.6 30.0 - 36.0 g/dL   RDW 14.6 11.5 - 15.5 %   Platelets 398 150 - 400 K/uL  Magnesium     Status: None   Collection Time: 09/18/17  4:03 AM  Result Value Ref Range   Magnesium 2.2 1.7 - 2.4 mg/dL  Phosphorus     Status: None   Collection Time: 09/18/17  4:03 AM  Result Value Ref Range   Phosphorus 2.6 2.5 - 4.6 mg/dL  Basic metabolic panel     Status: Abnormal   Collection Time: 09/18/17  4:03 AM  Result Value Ref Range   Sodium 142 135 - 145 mmol/L   Potassium 3.4 (L) 3.5 - 5.1 mmol/L   Chloride 109 101 - 111 mmol/L   CO2 24 22 - 32 mmol/L   Glucose, Bld 101 (H) 65 - 99 mg/dL   BUN 16 6 - 20 mg/dL   Creatinine, Ser 0.73 0.44 - 1.00 mg/dL   Calcium 8.1 (L) 8.9 - 10.3 mg/dL   GFR calc non Af Amer >60 >60 mL/min   GFR calc Af Amer >60 >60  mL/min    Comment: (NOTE) The eGFR has been calculated using the CKD EPI equation. This calculation has not been validated in all clinical situations. eGFR's persistently <60 mL/min signify possible Chronic Kidney Disease.    Anion gap 9 5 - 15  Glucose, capillary     Status: None   Collection Time: 09/18/17  7:48 AM  Result Value Ref Range   Glucose-Capillary 98 65 - 99 mg/dL  CBC     Status: None   Collection Time: 09/19/17  5:48 AM  Result Value Ref Range   WBC 7.9 4.0 - 10.5 K/uL   RBC 4.68 3.87 - 5.11 MIL/uL   Hemoglobin 13.3 12.0 - 15.0 g/dL   HCT 41.8 36.0 - 46.0 %   MCV 89.3 78.0 - 100.0 fL   MCH 28.4 26.0 - 34.0 pg   MCHC 31.8 30.0 - 36.0 g/dL   RDW 14.7 11.5 - 15.5 %   Platelets 394 150 - 400 K/uL  Basic metabolic panel     Status: Abnormal   Collection Time: 09/19/17  5:48 AM  Result Value Ref Range   Sodium 143 135 - 145 mmol/L   Potassium 4.3 3.5 - 5.1 mmol/L    Comment: DELTA CHECK NOTED NO VISIBLE HEMOLYSIS    Chloride 110 101 - 111 mmol/L   CO2 25 22 - 32 mmol/L   Glucose, Bld 98 65 - 99 mg/dL   BUN 14 6 - 20 mg/dL   Creatinine, Ser 0.70 0.44 - 1.00 mg/dL   Calcium 8.1 (L) 8.9 - 10.3 mg/dL   GFR calc non Af Amer >60 >60 mL/min   GFR calc Af Amer >60 >60 mL/min    Comment: (NOTE) The eGFR has been calculated using the CKD EPI equation. This calculation has not been validated in all clinical situations. eGFR's persistently <60 mL/min signify possible Chronic Kidney Disease.    Anion gap 8 5 - 15  Magnesium     Status: None   Collection Time: 09/19/17  5:48 AM  Result Value Ref Range   Magnesium 2.2 1.7 - 2.4 mg/dL  Phosphorus     Status: Abnormal   Collection Time: 09/19/17  5:48 AM  Result Value Ref Range   Phosphorus 2.3 (L) 2.5 - 4.6 mg/dL  Glucose, capillary     Status: None   Collection Time: 09/19/17  7:21 AM  Result Value Ref Range   Glucose-Capillary 87 65 - 99 mg/dL    Radiology/Results: Ct Biopsy  Result Date: 09/17/2017 CLINICAL  DATA:  Pelvic mass, diffuse peritoneal carcinomatosis and ascites. Prior cytology of peritoneal fluid was not conclusive for gynecologic malignancy and suggested potential GI origin. Request has now been made to perform biopsy of peritoneal tumor for more definitive diagnosis. EXAM: CT GUIDED CORE BIOPSY OF PERITONEAL MASS COMPARISON:  CT of the abdomen and pelvis on 09/14/2017 and 09/03/2017 ANESTHESIA/SEDATION: 2.0 mg IV Versed; 100 mcg IV Fentanyl Total Moderate Sedation Time:  20 minutes. The patient's level of consciousness and physiologic status were continuously monitored during the procedure by Radiology nursing. PROCEDURE: The procedure risks, benefits, and alternatives were explained to the patient. Questions regarding the procedure were encouraged and answered. The patient understands and consents to the procedure. The abdominal wall was prepped with chlorhexidine in a sterile fashion, and a sterile drape was applied covering the operative field. A sterile gown and sterile gloves were used for the procedure. Local anesthesia was provided with 1% Lidocaine. CT was performed in a supine position. Under CT guidance, a 35 gauge trocar needle was advanced from an anterior oblique position into the left anterior peritoneal cavity. Core biopsy was performed with an 18 gauge core biopsy needle device. A total of 4 samples were obtained and submitted in formalin. Additional CT images were obtained after outer needle removal. COMPLICATIONS: None FINDINGS: Small bowel shows decompression since prior CT on 09/14/2017 after nasogastric decompression. This now allows for a safe window to approach of peritoneal tumor in the anterior peritoneal cavity/omental region. Tumor just deep to the left anterior abdominal wall was targeted and revealed solid tissue with biopsy. IMPRESSION: CT-guided core biopsy performed of peritoneal tumor. Electronically Signed   By: Aletta Edouard M.D.   On: 09/17/2017 16:26     Anti-infectives: Anti-infectives (From admission, onward)   None      Assessment/Plan: Problem List: Patient Active Problem List   Diagnosis Date Noted  . Nausea and vomiting 09/15/2017  . Nausea & vomiting 09/14/2017  . Peritoneal carcinomatosis ([place]) 09/14/2017  . History of seizures 09/14/2017  . Protein-calorie malnutrition, severe ([place]) 09/08/2017  .  Ovarian mass 09/05/2017  . Other ascites 09/03/2017  . Ascites 09/03/2017    Will start TNA to prehab for possible surgery if needed before chemotherapy.  NG to try clamping with clear liquids-sham feeding.  Needs TNA however to improve her nutritional status.   * No surgery found *    LOS: 4 days   Matt B. Hassell Done, MD, [facility] [phone] beeper [phone]  09/19/2017 10:35 AM

## 2017-09-19 NOTE — Progress Notes (Signed)
Spoke to Dr Hassell Done r/t clamping ngt and will keep clamped unless nausea and may have sips of clear liquids and coffee.

## 2017-09-20 LAB — COMPREHENSIVE METABOLIC PANEL
ALT: 21 U/L (ref 14–54)
AST: 24 U/L (ref 15–41)
Albumin: 2.4 g/dL — ABNORMAL LOW (ref 3.5–5.0)
Alkaline Phosphatase: 79 U/L (ref 38–126)
Anion gap: 7 (ref 5–15)
BUN: 15 mg/dL (ref 6–20)
CHLORIDE: 114 mmol/L — AB (ref 101–111)
CO2: 24 mmol/L (ref 22–32)
Calcium: 8 mg/dL — ABNORMAL LOW (ref 8.9–10.3)
Creatinine, Ser: 0.65 mg/dL (ref 0.44–1.00)
GFR calc Af Amer: >60 mL/min (ref >60)
Glucose, Bld: 152 mg/dL — ABNORMAL HIGH (ref 65–99)
Potassium: 2.8 mmol/L — ABNORMAL LOW (ref 3.5–5.1)
SODIUM: 145 mmol/L (ref 135–145)
Total Bilirubin: 0.9 mg/dL (ref 0.3–1.2)
Total Protein: 5.9 g/dL — ABNORMAL LOW (ref 6.5–8.1)

## 2017-09-20 LAB — DIFFERENTIAL
Basophils Absolute: 0 10*3/uL (ref 0.0–0.1)
Basophils Relative: 0 %
Eosinophils Absolute: 0 10*3/uL (ref 0.0–0.7)
Eosinophils Relative: 0 %
LYMPHS ABS: 0.8 10*3/uL (ref 0.7–4.0)
Lymphocytes Relative: 10 %
Monocytes Absolute: 1.2 10*3/uL — ABNORMAL HIGH (ref 0.1–1.0)
Monocytes Relative: 13 %
NEUTROS PCT: 77 %
Neutro Abs: 6.8 10*3/uL (ref 1.7–7.7)

## 2017-09-20 LAB — PREALBUMIN: PREALBUMIN: 8.4 mg/dL — AB (ref 18–38)

## 2017-09-20 LAB — CBC
HEMATOCRIT: 40.8 % (ref 36.0–46.0)
HEMOGLOBIN: 13 g/dL (ref 12.0–15.0)
MCH: 28.4 pg (ref 26.0–34.0)
MCHC: 31.9 g/dL (ref 30.0–36.0)
MCV: 89.3 fL (ref 78.0–100.0)
Platelets: 401 10*3/uL — ABNORMAL HIGH (ref 150–400)
RBC: 4.57 MIL/uL (ref 3.87–5.11)
RDW: 14.7 % (ref 11.5–15.5)
WBC: 8.9 10*3/uL (ref 4.0–10.5)

## 2017-09-20 LAB — GLUCOSE, CAPILLARY
GLUCOSE-CAPILLARY: 143 mg/dL — AB (ref 65–99)
GLUCOSE-CAPILLARY: 146 mg/dL — AB (ref 65–99)
GLUCOSE-CAPILLARY: 147 mg/dL — AB (ref 65–99)
GLUCOSE-CAPILLARY: 150 mg/dL — AB (ref 65–99)

## 2017-09-20 LAB — PHOSPHORUS: PHOSPHORUS: 2.2 mg/dL — AB (ref 2.5–4.6)

## 2017-09-20 LAB — TRIGLYCERIDES: Triglycerides: 180 mg/dL — ABNORMAL HIGH (ref <150)

## 2017-09-20 LAB — MAGNESIUM: MAGNESIUM: 2.1 mg/dL (ref 1.7–2.4)

## 2017-09-20 MED ORDER — POTASSIUM CHLORIDE 10 MEQ/50ML IV SOLN
10.0000 meq | INTRAVENOUS | Status: AC
  Administered 2017-09-20 (×4): 10 meq via INTRAVENOUS
  Filled 2017-09-20 (×4): qty 50

## 2017-09-20 MED ORDER — POTASSIUM CHLORIDE 10 MEQ/50ML IV SOLN
10.0000 meq | INTRAVENOUS | Status: AC
  Administered 2017-09-20 (×2): 10 meq via INTRAVENOUS
  Filled 2017-09-20 (×2): qty 50

## 2017-09-20 MED ORDER — POTASSIUM PHOSPHATES 15 MMOLE/5ML IV SOLN
15.0000 mmol | Freq: Once | INTRAVENOUS | Status: AC
  Administered 2017-09-20: 15 mmol via INTRAVENOUS
  Filled 2017-09-20: qty 5

## 2017-09-20 MED ORDER — TRACE MINERALS CR-CU-MN-SE-ZN 10-1000-500-60 MCG/ML IV SOLN
INTRAVENOUS | Status: AC
  Administered 2017-09-20: 18:00:00 via INTRAVENOUS
  Filled 2017-09-20: qty 960

## 2017-09-20 MED ORDER — FAT EMULSION 20 % IV EMUL
120.0000 mL | INTRAVENOUS | Status: AC
Start: 2017-09-20 — End: 2017-09-21
  Administered 2017-09-20: 120 mL via INTRAVENOUS
  Filled 2017-09-20: qty 250

## 2017-09-20 MED ORDER — MAGNESIUM SULFATE IN D5W 1-5 GM/100ML-% IV SOLN
1.0000 g | Freq: Once | INTRAVENOUS | Status: AC
Start: 2017-09-20 — End: 2017-09-20
  Administered 2017-09-20: 1 g via INTRAVENOUS
  Filled 2017-09-20: qty 100

## 2017-09-20 NOTE — Plan of Care (Signed)
  Progressing Nutrition: Adequate nutrition will be maintained 09/20/2017 2259 - Progressing by Talbert Forest, RN Elimination: Will not experience complications related to bowel motility 09/20/2017 2259 - Progressing by Talbert Forest, RN Will not experience complications related to urinary retention 09/20/2017 2259 - Progressing by Talbert Forest, RN Health Behavior/Discharge Planning: Ability to manage health-related needs will improve 09/20/2017 2259 - Progressing by Talbert Forest, RN Clinical Measurements: Ability to maintain clinical measurements within normal limits will improve 09/20/2017 2259 - Progressing by Talbert Forest, RN

## 2017-09-20 NOTE — Progress Notes (Signed)
Nutrition Follow-up  DOCUMENTATION CODES:   Not applicable  INTERVENTION:    TPN per pharmacy  Monitor for diet advancement/toleration  Obtain daily weights  Pt is at high risk for refeeding. Monitor and supplement electrolytes as needed per MD descretion.   NUTRITION DIAGNOSIS:   Increased nutrient needs related to chronic illness, cancer and cancer related treatments, catabolic illness as evidenced by estimated needs.  Ongoing  GOAL:   Patient will meet greater than or equal to 90% of their needs  Not meeting  MONITOR:   PO intake, Supplement acceptance, Weight trends, I & O's, Labs  REASON FOR ASSESSMENT:   Malnutrition Screening Tool    ASSESSMENT:   Pt with PMH of seizures and recently diagnosed peritoneal carcinomatosis with bowel obstruction/colonic obstruction secondary to malignancy of unknown primary presents with N/V, admitted for further management/evaluation.    Pt currently has NGT clamped. RD consulted for TPN recommendations for possible surgery if needed before chemotherapy. Pt advanced to CLD allowing sips of clears this morning. Has not had first meal. Will add clear supplement once diet is changed.   Pt currently receiving Clinimix 5/15 @ 40 ml/hr + 20% ILE at 10 ml/hr x12 hours. Provides 922 calories and 48 grams of protein. Plan to increase rate slowly until electrolyte abnormalities are corrected.   Agree with pharmacy Clinimix 5/15 @ goal rate 75 ml/hr + 20% ILE 20 ml/hr x 12 hours to provide 1758 kcals and 90 grams of protein. This meets 100% of calorie and protein needs.   Pt showing significant weight loss in records likely due to 6.8 ml fluid drainage s/p thoracentesis x2. Recommend checking daily weights to monitor weight trends.   Medications reviewed and include: SSI Labs reviewed: K 2.8 (L) Phosphorus 2.2 (L) PAB 8.4 (L)  Diet Order:  Diet NPO time specified Except for: Sips with Meds TPN (CLINIMIX-E) Adult TPN (CLINIMIX-E)  Adult  EDUCATION NEEDS:   Not appropriate for education at this time  Skin:  Skin Assessment: Reviewed RN Assessment  Last BM:  09/19/16  Height:   Ht Readings from Last 1 Encounters:  09/14/17 5\' 2"  (1.575 m)    Weight:   Wt Readings from Last 1 Encounters:  09/14/17 139 lb 12.8 oz (63.4 kg)    Ideal Body Weight:  50 kg  BMI:  Body mass index is 25.57 kg/m.  Estimated Nutritional Needs:   Kcal:  1700-1900  Protein:  80-90 grams  Fluid:  >/= 1.7 L/d    Mariana Single RD, LDN Clinical Nutrition Pager # - [phone]

## 2017-09-20 NOTE — Progress Notes (Signed)
PT Cancellation Note  Patient Details Name: Christie Maynard MRN: [id number] DOB: 03-05-1956   Cancelled Treatment:    Reason Eval/Treat Not Completed: Other (comment) Reorder of PT evaluation received.  Pt evaluated by PT on 09/15/17, and pt mobilizing well at that time.  Request nursing staff to ambulate with pt (RN notified).  RN reports pt up many times in room to bathroom.  Will check back to see if reorder remains appropriate.   Samwise Eckardt,KATHrine E 09/20/2017, 5:06 PM Carmelia Bake, PT, DPT 09/20/2017 Pager: [phone]

## 2017-09-20 NOTE — Progress Notes (Signed)
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 Patient reports poor oral intake and unable to eat solid foods for past few weeks but 24 hours prior to this admission, she felt Christie Maynard lot worse, bloated, uncomfortable. Off note patient had appointment with gastroenterologist scheduled for 09/15/2017 due to cytology results concerning for adenocarcinoma.  Assessment & Plan:   Principal Problem:   Nausea & vomiting Active Problems:   Ascites   Peritoneal carcinomatosis (HCC)   History of seizures   Nausea and vomiting  Peritoneal Carcinomatosis  Ascites  Nausea and vomiting  Small Bowel Obstruction: Primary unclear.  Pt was following with Dr. 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  Plan was for carboplatin/paclitaxel x 3 cycles followed by debulking, but it looks like cytology from paracentesis on 12/22 has come back atypical cells suspicious for adenocarcinoma of GI tract and Christie Maynard GI consult was pending.   - CT abdomen/pelvis with contrast with progressing SBO  - NGT clamped - appreciate GI c/s (elevated CEA and biopsy results pending, considering endoscopy - looks like plan for water soluble contrast enema per Dr. Oletta Lamas on Friday, but for unknown reason this was canceled.  Unfortunately neither Dr. Harlow Asa or Dr. Oletta Lamas here today, will discuss need for this going forward - GI has signed off at this time) - appreciate surgery c/s - advancing diet, TPN for nutrition support, awaiting path - appreciate gyn onc c/s (primary malignancy unclear at this point - s/p biopsy by IR - if persistent obstruction, may need parenteral nutrition)  - onc is  aware, awaiting bx results - antiemetics (QT at presentation appropriate)  Hypokalemia: f/u mag, replete prn (pharm repleted today)  Anion gap metabolic acidosis: improved  L ureteral obstruction s/p stent placement: Follow creatinine  Abnormal UA: urine cx negatve  Leukocytosis: improved  Mildly elevated liver enzymes: improved  Mildly elevated lipase: imaging without pancreatitis  History of seizures: has had 1 seizure in 1980's, none since.  Stable on dilantin. Will give IV dilantin while NPO.  DVT prophylaxis: lovenox Code Status: full  Family Communication: none at bedside Disposition Plan: pending   Consultants:   Gyn onc  Surgery  GI  Procedures:   NG tube placement  Antimicrobials: (specify start and planned stop date. Auto populated tables are space occupying and do not give end dates) Anti-infectives (From admission, onward)   None      Subjective: No complaints  Objective: Vitals:   09/19/17 1352 09/19/17 2137 09/20/17 0402 09/20/17 1302  BP: (!) 147/99 (!) 149/89 (!) 152/95 (!) 139/92  Pulse: (!) 112 92 99 (!) 102  Resp: 19 20 20 20   Temp: 98.6 F (37 C) 98.5 F (36.9 C) 99.2 F (37.3 C) 97.8 F (36.6 C)  TempSrc: Oral Oral Oral Oral  SpO2: 97% 96% 97% 98%  Weight:      Height:        Intake/Output Summary (Last 24 hours) at 09/20/2017 1756 Last data filed at 09/20/2017 1700 Gross per 24 hour  Intake 2874.33 ml  Output 1175 ml  Net 1699.33 ml   Filed Weights   09/14/17 1127 09/14/17 2110  Weight: 68 kg (150 lb) 63.4 kg (139 lb 12.8  oz)    Examination:  General: No acute distress. Cardiovascular: Heart sounds show Christie Maynard regular rate, and rhythm. No gallops or rubs. No murmurs. No JVD. Lungs: Clear to auscultation bilaterally with good air movement. No rales, rhonchi or wheezes. Abdomen: Soft, nontender, nondistended with normal active bowel sounds. No masses. No hepatosplenomegaly. Neurological: Alert and oriented 3. Moves  all extremities 4 with equal strength. Cranial nerves II through XII grossly intact. Skin: Warm and dry. No rashes or lesions. Extremities: No clubbing or cyanosis. No edema.  Psychiatric: Mood and affect are normal. Insight and judgment are appropriate.  Data Reviewed: I have personally reviewed following labs and imaging studies  CBC: Recent Labs  Lab 09/16/17 0416 09/17/17 0427 09/18/17 0403 09/19/17 0548 09/20/17 0421  WBC 8.0 7.7 8.1 7.9 8.9  NEUTROABS  --  5.6  --   --  6.8  HGB 13.7 12.9 13.3 13.3 13.0  HCT 42.1 39.4 42.1 41.8 40.8  MCV 88.8 88.5 89.4 89.3 89.3  PLT 407* 401* 398 394 740*   Basic Metabolic Panel: Recent Labs  Lab 09/16/17 0416 09/17/17 0427 09/18/17 0403 09/19/17 0548 09/20/17 0421  NA 139 141 142 143 145  K 4.6 3.8 3.4* 4.3 2.8*  CL 108 110 109 110 114*  CO2 23 22 24 25 24   GLUCOSE 101* 104* 101* 98 152*  BUN 17 17 16 14 15   CREATININE 0.80 0.71 0.73 0.70 0.65  CALCIUM 8.3* 8.4* 8.1* 8.1* 8.0*  MG  --   --  2.2 2.2 2.1  PHOS  --   --  2.6 2.3* 2.2*   GFR: Estimated Creatinine Clearance: 63 mL/min (by C-G formula based on SCr of 0.65 mg/dL). Liver Function Tests: Recent Labs  Lab 09/14/17 1149 09/15/17 0408 09/17/17 0427 09/20/17 0421  AST 66* 50* 20 24  ALT 51 40 25 21  ALKPHOS 162* 131* 100 79  BILITOT 1.6* 1.3* 1.1 0.9  PROT 7.4 6.1* 5.9* 5.9*  ALBUMIN 3.1* 2.4* 2.4* 2.4*   Recent Labs  Lab 09/14/17 1149  LIPASE 69*   No results for input(s): AMMONIA in the last 168 hours. Coagulation Profile: Recent Labs  Lab 09/17/17 0427  INR 1.51   Cardiac Enzymes: No results for input(s): CKTOTAL, CKMB, CKMBINDEX, TROPONINI in the last 168 hours. BNP (last 3 results) No results for input(s): PROBNP in the last 8760 hours. HbA1C: No results for input(s): HGBA1C in the last 72 hours. CBG: Recent Labs  Lab 09/19/17 0721 09/19/17 1817 09/20/17 0010 09/20/17 0559 09/20/17 1201  GLUCAP 87 120* 146* 147* 143*   Lipid  Profile: Recent Labs    09/20/17 0421  TRIG 180*   Thyroid Function Tests: No results for input(s): TSH, T4TOTAL, FREET4, T3FREE, THYROIDAB in the last 72 hours. Anemia Panel: No results for input(s): VITAMINB12, FOLATE, FERRITIN, TIBC, IRON, RETICCTPCT in the last 72 hours. Sepsis Labs: Recent Labs  Lab 09/14/17 2107 09/14/17 2343  LATICACIDVEN 1.6 0.9    Recent Results (from the past 240 hour(s))  Culture, Urine     Status: None   Collection Time: 09/14/17  4:30 PM  Result Value Ref Range Status   Specimen Description URINE, RANDOM  Final   Special Requests NONE  Final   Culture   Final    NO GROWTH Performed at [facility], [address], [place], [place] [address]    Report Status 09/16/2017 FINAL  Final         Radiology Studies: No results found.  Scheduled Meds: . enoxaparin (LOVENOX) injection  40 mg Subcutaneous Q24H  . insulin aspart  0-9 Units Subcutaneous Q6H  . phenytoin (DILANTIN) IV  100 mg Intravenous QHS   Continuous Infusions: . sodium chloride 50 mL/hr at 09/19/17 2045  . Marland KitchenTPN (CLINIMIX-E) Adult     And  . fat emulsion    . potassium PHOSPHATE IVPB (mmol) Stopped (09/20/17 1805)  . Marland KitchenTPN (CLINIMIX-E) Adult 30 mL/hr at 09/19/17 1810     LOS: 5 days    Time spent: over 30 min    Fayrene Helper, MD [facility] [phone]  If 7PM-7AM, please contact night-coverage [email or web address] Password [email or web address] 09/20/2017, 5:56 PM

## 2017-09-20 NOTE — Progress Notes (Signed)
Patient's NG tube has been clamped and she is tolerating small amounts of clears, without nausea, feeling quite well.  Has had small squirts of liquid stool, and is passing flatus.  Overall, feeling fairly well.  Various notes have been reviewed.  Amongst other things, it was decided to cancel the Gastrografin enema which Dr. Oletta Lamas had proposed doing to look for a lower GI source of the patient's presumed malignancy, given the elevated CEA level.  It does not appear that there will be a role for [place] going forward with this patient's management; realistically, I think the surgeons will be the primary coordinators of her management.  Accordingly, I will tentatively sign off at this time, but would be very happy to see the patient again if we can be of further assistance in her care, or give me a call if you would like to discuss her case.  Cleotis Nipper, M.D. Pager [phone] If no answer or after 5 PM call [phone]

## 2017-09-20 NOTE — Progress Notes (Signed)
   Subjective/Chief Complaint: Feels tired but denies any abd pain. Passing flatus and having small bm's   Objective: Vital signs in last 24 hours: Temp:  [98.5 F (36.9 C)-99.2 F (37.3 C)] 99.2 F (37.3 C) (01/07 0402) Pulse Rate:  [92-112] 99 (01/07 0402) Resp:  [19-20] 20 (01/07 0402) BP: (147-152)/(89-99) 152/95 (01/07 0402) SpO2:  [96 %-97 %] 97 % (01/07 0402) Last BM Date: 09/19/17  Intake/Output from previous day: 01/06 0701 - 01/07 0700 In: 2592.4 [P.O.:350; I.V.:1972.4; IV Piggyback:270] Out: 675 [Urine:575; Emesis/NG output:100] Intake/Output this shift: No intake/output data recorded.  General appearance: alert and cooperative Resp: clear to auscultation bilaterally Cardio: regular rate and rhythm GI: soft, nontender  Lab Results:  Recent Labs    09/19/17 0548 09/20/17 0421  WBC 7.9 8.9  HGB 13.3 13.0  HCT 41.8 40.8  PLT 394 401*   BMET Recent Labs    09/19/17 0548 09/20/17 0421  NA 143 145  K 4.3 2.8*  CL 110 114*  CO2 25 24  GLUCOSE 98 152*  BUN 14 15  CREATININE 0.70 0.65  CALCIUM 8.1* 8.0*   PT/INR No results for input(s): LABPROT, INR in the last 72 hours. ABG No results for input(s): PHART, HCO3 in the last 72 hours.  Invalid input(s): PCO2, PO2  Studies/Results: No results found.  Anti-infectives: Anti-infectives (From admission, onward)   None      Assessment/Plan: s/p * No surgery found * Advance diet. Allow sips of clears. Ng clamped Consult Oncology Await path results Continue tpn for nutrition support  LOS: 5 days    TOTH III,Christie Maynard S 09/20/2017

## 2017-09-20 NOTE — Progress Notes (Signed)
PHARMACY - ADULT TOTAL PARENTERAL NUTRITION CONSULT NOTE   Pharmacy Consult for TPN Indication: bowel obstruction  Patient Measurements: Height: 5\' 2"  (157.5 cm) Weight: 139 lb 12.8 oz (63.4 kg) IBW/kg (Calculated) : 50.1 TPN AdjBW (KG): 53.4 Body mass index is 25.57 kg/m. Usual Weight: 68 kg  Insulin Requirements: 2 units sensitive SSI / 24 hrs  Current Nutrition: NPO  IVF: NS at 50 ml/hr  Central access: PICC placed 1/6 TPN start date: 1/6  ASSESSMENT                                                                                                          HPI: Pt with PMH of seizures and recently diagnosed peritoneal carcinomatosis with bowel obstruction/colonic obstruction secondary to malignancy of unknown primary presents with N/V, admitted for further management/evaluation. CT abdomen/pelviswith contrast shows progressing SBO. Having BMs but with 1450 ml from NGT, Surgery recommends starting TPN. Patient with significant recent weight loss but primarily d/t multiple large-volume paracentesis. Poor intake since week before Xmas, and unable to keep anything down since 12/31. Appears to be moderate risk for refeeding.  Significant events:   Today:   Glucose - within goal range (< 150) on minimal SSI, range 87-147.  Electrolytes - Na, CO2, Mag WNL.  K low, Phos remains low and decreased despite replacement.    Renal - SCr/BUN stable WNL  LFTs - slightly elevated on admit, now WNL (1/7)  TGs - slightly elevated at 180 (1/7)  Prealbumin - 8.4 (1.7)  NUTRITIONAL GOALS                                                                                             RD recs (09/15/17):  Kcal:  1700-1900 Protein:  80-90 grams Fluid:  >/= 1.7 L/d  Clinimix 5/15 at a goal rate of 75 ml/hr + 20% fat emulsion 240 ml to provide: 90 g/day protein, 1758 Kcal/day.  PLAN                                                                                                                          Now:  KPhos 15 mmol (also provides 22 mEq  K+) KCl 40 mEq IV (total ~ 60 mEq K) Magnesium 1g IV x1 dose   At 1800 today:  Advance to Clinimix E 5/15 at 40 ml/hr (only small rate increase until electrolyte abnormalities are corrected)  20% fat emulsion at reduced rate of 10 ml/hr x 12 hr  Plan to advance as tolerated to the goal rate  TPN to contain standard multivitamins and trace elements  IVF at 50 ml/hr  Continue sensitive SSI with q6h CBG checks  TPN lab panels on Mondays & Thursdays  F/u daily  Gretta Arab PharmD, BCPS Pager [phone] 09/20/2017 8:30 AM

## 2017-09-20 NOTE — Progress Notes (Signed)
Patient sitting in the chair in no acute distress.  Tolerating ice chips with NG tube clamped with no nausea or emesis reported.  Informed patient that results from recent IR biopsy will most likely be ready in the am.  Immunostains have been ordered.  No concerns voiced from patient.  GYN ONC will continue to follow.

## 2017-09-21 ENCOUNTER — Inpatient Hospital Stay (HOSPITAL_COMMUNITY): Payer: BLUE CROSS/BLUE SHIELD

## 2017-09-21 DIAGNOSIS — K56699 Other intestinal obstruction unspecified as to partial versus complete obstruction: Secondary | ICD-10-CM

## 2017-09-21 DIAGNOSIS — Z803 Family history of malignant neoplasm of breast: Secondary | ICD-10-CM

## 2017-09-21 DIAGNOSIS — Z801 Family history of malignant neoplasm of trachea, bronchus and lung: Secondary | ICD-10-CM

## 2017-09-21 LAB — CBC
HEMATOCRIT: 39.1 % (ref 36.0–46.0)
Hemoglobin: 12.3 g/dL (ref 12.0–15.0)
MCH: 28.1 pg (ref 26.0–34.0)
MCHC: 31.5 g/dL (ref 30.0–36.0)
MCV: 89.3 fL (ref 78.0–100.0)
Platelets: 274 10*3/uL (ref 150–400)
RBC: 4.38 MIL/uL (ref 3.87–5.11)
RDW: 14.8 % (ref 11.5–15.5)
WBC: 9.3 10*3/uL (ref 4.0–10.5)

## 2017-09-21 LAB — COMPREHENSIVE METABOLIC PANEL
ALT: 26 U/L (ref 14–54)
ANION GAP: 4 — AB (ref 5–15)
AST: 30 U/L (ref 15–41)
Albumin: 2.2 g/dL — ABNORMAL LOW (ref 3.5–5.0)
Alkaline Phosphatase: 77 U/L (ref 38–126)
BILIRUBIN TOTAL: 0.8 mg/dL (ref 0.3–1.2)
BUN: 14 mg/dL (ref 6–20)
CO2: 24 mmol/L (ref 22–32)
Calcium: 7.8 mg/dL — ABNORMAL LOW (ref 8.9–10.3)
Chloride: 113 mmol/L — ABNORMAL HIGH (ref 101–111)
Creatinine, Ser: 0.56 mg/dL (ref 0.44–1.00)
Glucose, Bld: 139 mg/dL — ABNORMAL HIGH (ref 65–99)
POTASSIUM: 3.5 mmol/L (ref 3.5–5.1)
Sodium: 141 mmol/L (ref 135–145)
TOTAL PROTEIN: 5.6 g/dL — AB (ref 6.5–8.1)

## 2017-09-21 LAB — GLUCOSE, CAPILLARY
GLUCOSE-CAPILLARY: 168 mg/dL — AB (ref 65–99)
Glucose-Capillary: 116 mg/dL — ABNORMAL HIGH (ref 65–99)
Glucose-Capillary: 127 mg/dL — ABNORMAL HIGH (ref 65–99)
Glucose-Capillary: 127 mg/dL — ABNORMAL HIGH (ref 65–99)

## 2017-09-21 LAB — MAGNESIUM: Magnesium: 2.3 mg/dL (ref 1.7–2.4)

## 2017-09-21 LAB — PHOSPHORUS: Phosphorus: 2.4 mg/dL — ABNORMAL LOW (ref 2.5–4.6)

## 2017-09-21 MED ORDER — FAT EMULSION 20 % IV EMUL
120.0000 mL | INTRAVENOUS | Status: AC
  Administered 2017-09-21: 120 mL via INTRAVENOUS
  Filled 2017-09-21: qty 250

## 2017-09-21 MED ORDER — BOOST / RESOURCE BREEZE PO LIQD CUSTOM
1.0000 | Freq: Two times a day (BID) | ORAL | Status: DC
  Administered 2017-09-21: 1 via ORAL

## 2017-09-21 MED ORDER — POTASSIUM PHOSPHATES 15 MMOLE/5ML IV SOLN
15.0000 mmol | Freq: Once | INTRAVENOUS | Status: AC
  Administered 2017-09-21: 15 mmol via INTRAVENOUS
  Filled 2017-09-21: qty 5

## 2017-09-21 MED ORDER — SODIUM CHLORIDE 0.9 % IV SOLN: INTRAVENOUS | Status: AC

## 2017-09-21 MED ORDER — TRACE MINERALS CR-CU-MN-SE-ZN 10-1000-500-60 MCG/ML IV SOLN
INTRAVENOUS | Status: AC
  Administered 2017-09-21: 18:00:00 via INTRAVENOUS
  Filled 2017-09-21: qty 1440

## 2017-09-21 MED ORDER — POTASSIUM CHLORIDE 10 MEQ/50ML IV SOLN
10.0000 meq | INTRAVENOUS | Status: AC
  Administered 2017-09-21 (×2): 10 meq via INTRAVENOUS
  Filled 2017-09-21 (×4): qty 50

## 2017-09-21 MED ORDER — SODIUM CHLORIDE 0.9 % IV SOLN
INTRAVENOUS | Status: DC
  Administered 2017-09-21: 18:00:00 via INTRAVENOUS

## 2017-09-21 NOTE — Plan of Care (Signed)
  Progressing Elimination: Will not experience complications related to bowel motility 09/21/2017 2305 - Progressing by Talbert Forest, RN Will not experience complications related to urinary retention 09/21/2017 2305 - Progressing by Talbert Forest, RN Health Behavior/Discharge Planning: Ability to manage health-related needs will improve 09/21/2017 2305 - Progressing by Talbert Forest, RN

## 2017-09-21 NOTE — Consult Note (Signed)
New Hematology/Oncology Consult   Referral MD: Dr. 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  She presented to the emergency department on 09/03/2017 with these symptoms as well as an episode of hematuria.  Abdominal ultrasound showed a right pleural effusion and moderate ascites.  Liver contour appeared slightly nodular.  There was sludge in the gallbladder.  CT abdomen/pelvis showed a multilobulated irregular heterogeneous enhancing soft tissue mass within the pelvis either involving both ovaries or a single mass arising from the right and extending across the midline with components measuring 10.6 x 4.9 x 9.8 cm and 8.2 x 4.9 x 4.8 cm.  There was associated ascites and omental caking consistent with peritoneal carcinomatosis.  A distal small bowel obstruction was suspected.  There was distal left ureteral obstruction with left hydronephrosis and hydroureter.  Ultrasound paracentesis was performed 09/04/2017.  Atypical cells were present, suspicious for adenocarcinoma of the GI tract.  She underwent left ureteral stent placement by Dr. Alinda Money on 09/05/2017.  She was discharged home 09/05/2017 with plans for outpatient oncology follow-up.  She saw Dr. 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  She had a second paracentesis on 09/13/2017 with atypical cells present.    She was readmitted on 09/14/2017 with nausea and vomiting.  CT showed a progressing small bowel obstruction with diffusely dilated fluid-filled small bowel.  Transition zone in the right lower quadrant.  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  Right lower quadrant mass measuring 4.4 cm.  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  Nonspecific staining for PAX-8.  Negative for estrogen receptor, progesterone receptor and cytokeratin 7.    Past Medical History:  Diagnosis Date  . Cancer ([place])   .  G0P0   . Seizures ([place])   . UTI (urinary tract infection)   :  Past Surgical History:  Procedure Laterality Date  . CYSTOSCOPY W/ URETERAL STENT PLACEMENT Left 09/05/2017   Procedure: CYSTOSCOPY WITH LEFT URETERAL STENT REPLACEMENT;  Surgeon: Raynelle Bring, MD;  Location: [facility];  Service: Urology;  Laterality: Left;  . IR PARACENTESIS  09/13/2017  . NO PAST SURGERIES    :   Current Facility-Administered Medications:  .  Marland KitchenTPN (CLINIMIX-E) Adult, , Intravenous, Continuous TPN **AND** fat emulsion 20 % infusion 120 mL, 120 mL, Intravenous, Continuous TPN, Shade, Christine E, RPH .  0.9 %  sodium chloride infusion, , Intravenous, Continuous, Shade, Haze Justin, RPH, Last Rate: 40 mL/hr at 09/21/17 1044 .  0.9 %  sodium chloride infusion, , Intravenous, Continuous, Shade, Christine E, RPH .  enoxaparin (LOVENOX) injection 40 mg, 40 mg, Subcutaneous, Q24H, Aletta Edouard, MD, 40 mg at 09/20/17 2102 .  feeding supplement (BOOST / RESOURCE BREEZE) liquid 1 Container, 1 Container, Oral, BID BM, Elodia Florence., MD, 1 Container at 09/21/17 1047 .  insulin aspart (novoLOG) injection 0-9 Units, 0-9 Units, Subcutaneous, Q6H, Polly Cobia, RPH, 2 Units at 09/21/17 1151 .  morphine 4 MG/ML injection 1-2 mg, 1-2 mg, Intravenous, Q4H PRN, Theodis Blaze, MD, 1 mg at 09/15/17 1851 .  ondansetron (ZOFRAN) tablet 4 mg, 4 mg, Oral, Q6H PRN **  OR** ondansetron (ZOFRAN) injection 4 mg, 4 mg, Intravenous, Q6H PRN, Elodia Florence., MD, 4 mg at 09/15/17 1142 .  phenytoin (DILANTIN) injection 100 mg, 100 mg, Intravenous, QHS, Elodia Florence., MD, 100 mg at 09/20/17 2102 .   potassium PHOSPHATE 15 mmol in dextrose 5 % 250 mL infusion, 15 mmol, Intravenous, Once, Shade, Christine E, RPH, Last Rate: 43 mL/hr at 09/21/17 1313, 15 mmol at 09/21/17 1313 .  sodium chloride flush (NS) 0.9 % injection 10-40 mL, 10-40 mL, Intracatheter, PRN, Elodia Florence., MD, 10 mL at 09/20/17 1821 .  TPN (CLINIMIX-E) Adult, , Intravenous, Continuous TPN, Last Rate: 40 mL/hr at 09/20/17 1821 **AND** [EXPIRED] fat emulsion 20 % infusion 120 mL, 120 mL, Intravenous, Continuous TPN, Shade, Christine E, RPH, Stopped at 09/21/17 0622:  . enoxaparin (LOVENOX) injection  40 mg Subcutaneous Q24H  . feeding supplement  1 Container Oral BID BM  . insulin aspart  0-9 Units Subcutaneous Q6H  . phenytoin (DILANTIN) IV  100 mg Intravenous QHS  :  No Known Allergies:  FH: Mother with breast cancer age 19, sister with breast cancer in her 6s, maternal aunt with breast cancer in her late 37s, paternal aunt with breast cancer in her late 32s, father deceased with lung cancer  SOCIAL HISTORY: She lives in [place] with her husband.  She has 1 son.  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  No alcohol or tobacco use.  Review of Systems: She reports onset of nausea/bloating November 2018.  No anorexia or weight loss.  No fevers or sweats.  No unusual headaches.  No vision change.  No shortness of breath or cough.  No chest pain.  No leg swelling.  No change in bowel habits.  She had an episode of blood on the toilet tissue after urinating last month.  No other bleeding.  No dysuria.   Physical Exam:  Blood pressure 130/83, pulse 89, temperature 98.3 F (36.8 C), temperature source Oral, resp. rate 19, height 5' 2"  (1.575 m), weight 139 lb 12.8 oz (63.4 kg), SpO2 98 %.  Lungs: Lungs clear bilaterally. Cardiac: Regular rate and rhythm. Abdomen: Soft, distended.  Nontender.  No hepatosplenomegaly, no mass. Vascular: No  leg edema. Lymph nodes: No palpable cervical, supraclavicular, axillary or inguinal lymph nodes. Neurologic: Alert and oriented.   LABS:  Recent Labs    09/20/17 0421 09/21/17 0426  WBC 8.9 9.3  HGB 13.0 12.3  HCT 40.8 39.1  PLT 401* 274    Recent Labs    09/20/17 0421 09/21/17 0426  NA 145 141  K 2.8* 3.5  CL 114* 113*  CO2 24 24  GLUCOSE 152* 139*  BUN 15 14  CREATININE 0.65 0.56  CALCIUM 8.0* 7.8*      RADIOLOGY:  Dg Chest 2 View  Result Date: 08/30/2017 CLINICAL DATA:  Follow-up left lung base opacity on recent abdominal radiograph EXAM: CHEST  2 VIEW COMPARISON:  08/26/2017 abdominal radiographs FINDINGS: Normal heart size. Small to moderate hiatal hernia. Otherwise normal mediastinal contour. No pneumothorax. No pleural effusions. No pulmonary edema. Lungs appear clear, with no acute consolidative airspace disease. IMPRESSION: Small to moderate hiatal hernia.  No active pulmonary disease. Electronically Signed   By: Ilona Sorrel M.D.   On: 08/30/2017 17:19   Dg Abd 1 View  Result Date: 09/15/2017 CLINICAL DATA:  Nasogastric tube placement.  Pelvic malignancy. EXAM: ABDOMEN - 1 VIEW COMPARISON:  Radiographs and CT 09/14/2017. FINDINGS: 1537 hours. Enteric tube projects below the diaphragm with tip in the left subphrenic region, likely in the gastric fundus. There is a double-J left ureteral stent. Mild bowel wall thickening is present in the left mid abdomen. No supine evidence of free intraperitoneal air. Right pelvic calcifications consistent with phleboliths are stable. IMPRESSION: Enteric tube projects over the left upper quadrant of the abdomen, likely in the gastric fundus. Electronically Signed   By: Richardean Sale M.D.   On: 09/15/2017 16:11   Ct Abdomen Pelvis W Contrast  Result Date: 09/14/2017 CLINICAL DATA:  Abdominal swelling and vomiting. Increasing weakness. Patient is recently been diagnosed with cervical cancer. Paracentesis 1 day ago and last week.  EXAM: CT ABDOMEN AND PELVIS WITH CONTRAST TECHNIQUE: Multidetector CT imaging of the abdomen and pelvis was performed using the standard protocol following bolus administration of intravenous contrast. CONTRAST:  51m ISOVUE-300 IOPAMIDOL (ISOVUE-300) INJECTION 61% COMPARISON:  09/03/2017 FINDINGS: Lower chest: Small bilateral pleural effusions, greater on the right. Mild basilar atelectasis is likely compressive. Moderate-sized esophageal hiatal hernia. Hepatobiliary: 1 cm diameter hypo dense lesion in segment 4 of the liver may represent a metastatic focus. No other focal liver lesions identified. Gallbladder and bile ducts are unremarkable. Pancreas: Pancreas is atrophic. No focal lesion or inflammatory changes appreciated. Spleen: Spleen size is normal.  No focal lesions. Adrenals/Urinary Tract: Right adrenal gland nodule measuring 2.2 cm in diameter. Hounsfield unit measurements on portal venous and delayed phase imaging demonstrate a relative washout of 55%. Relative washout of 40% or greater suggests benign adenoma. Renal nephrograms are homogeneous. Mildly dilated left intrarenal collecting system. A left ureteral stent is in place. Bladder is unremarkable. Stomach/Bowel: Stomach is not abnormally distended. There is diffuse dilatation of fluid-filled small bowel with decompressed terminal ileum. This is progressing since the previous study. Transition zone appears to be in the right lower quadrant. Appearance is consistent with small bowel obstruction. Cause of obstruction may be extrinsic compression of the small bowel, metastasis, or direct invasion. Colon is decompressed. Small amount of residual contrast material in the colon. Appendix is normal. Vascular/Lymphatic: No significant vascular findings are present. No enlarged abdominal or pelvic lymph nodes. Reproductive: Large heterogeneous pelvic mass extending above the uterus. This likely arises from the right ovary and appears to demonstrate direct  invasion of the uterine fundus on the left. The mass measures about 6.7 by 10.2 x 10.7 cm. Other: Right lower quadrant mesenteric mass measuring 4.4 cm in diameter. Diffuse nodular infiltration throughout the omentum and mesenteric fat consistent with diffuse peritoneal metastasis. Small amount of free fluid throughout the abdomen and pelvis. No free air. Abdominal wall musculature appears intact. Musculoskeletal: Mild degenerative changes in the spine. Heterogeneous appearance of the proximal left femur with trabecular coarsening. Appearance is most consistent with Paget's disease although metastatic lesion could also have this appearance. IMPRESSION: 1. Large heterogeneous pelvic mass lesion consistent with malignancy, likely ovarian in origin. Probable direct invasion of the uterus. 2. Diffuse peritoneal carcinomatosis with nodular infiltration of the omentum and mesentery and diffuse peritoneal fluid. Right lower quadrant mass measuring 4.4 cm diameter, likely metastatic. 3. Progressing small bowel obstruction with diffusely dilated fluid-filled small bowel. Transition zone is in the right lower quadrant. 4. Right adrenal gland nodule has characteristics suggesting a benign adenoma. 5. Bone changes in the proximal left femur probably represent Paget's disease although bone metastasis is not excluded. Consider bone scan for further evaluation if clinically indicated. 6. Left 3 atrial stent  in place with mild residual hydronephrosis. 7. 1 cm low-attenuation lesion in the left lobe of the liver suspicious for metastasis. Electronically Signed   By: Lucienne Capers M.D.   On: 09/14/2017 19:29   Ct Abdomen Pelvis W Contrast  Result Date: 09/03/2017 CLINICAL DATA:  New onset ascites, epigastric pain, nausea, and vomiting for 3 days, abdominal distension swelling, recent UTI EXAM: CT ABDOMEN AND PELVIS WITH CONTRAST TECHNIQUE: Multidetector CT imaging of the abdomen and pelvis was performed using the standard  protocol following bolus administration of intravenous contrast. Sagittal and coronal MPR images reconstructed from axial data set. CONTRAST:  100 ML ISOVUE-300 IOPAMIDOL (ISOVUE-300) INJECTION 61% IV. No oral contrast given. COMPARISON:  None FINDINGS: Lower chest: Tiny BILATERAL pleural effusions larger on RIGHT. Subsegmental atelectasis at LEFT base. Hepatobiliary: Dependent density within gallbladder question calculi or sludge. Liver normal appearance. No biliary dilatation. Pancreas: Normal appearance Spleen: Normal appearance Adrenals/Urinary Tract: RIGHT adrenal mass 22 x 19 mm demonstrating significant washout on delayed images consistent with adenoma. LEFT adrenal gland unremarkable. LEFT hydronephrosis and hydroureter present. No RIGHT-side urinary tract dilatation. LEFT ureteral dilatation terminates at a mass in the pelvis see below. Inhomogeneous LEFT nephrogram question pyelonephritis. No renal mass lesions. Bladder decompressed. Stomach/Bowel: Appendix not localized. Colon decompressed though displaced medially especially RIGHT by ascites. Moderate to large hiatal hernia. Decompressed proximal and minimally dilated distal small bowel loops suggesting mild degree of small bowel obstruction. Transition zone appears to be within the RIGHT pelvis at the level of the distal ileum. No definite bowel wall thickening Vascular/Lymphatic: Aorta normal caliber.  No definite adenopathy. Reproductive: Irregular heterogeneous nodular appearing soft tissue masses in the adnexa bilaterally with nodular areas of peripheral irregular enhancement compatible with ovarian neoplasm. It is uncertain whether this represents a single large multilobulated mass which extends across the midline in both adnexal regions or contiguous BILATERAL ovarian masses. The larger mass portion to the RIGHT of midline measures 10.6 x 4.9 x 9.8 cm and the smaller component to the LEFT of midline measures 8.2 x 4.9 x 4.8 cm. Tumor masses are  contiguous with the uterus. Mildly complicated cystic lesion at the LEFT lateral aspect of the uterus 3.1 x 2.4 cm image 74 could be of uterine or ovarian origin. Other: Large amount of ascites. Fluid in lesser sac. Diffuse omental caking by tumor. No free air. No hernia. Musculoskeletal: No acute osseous findings. Prominent epidural vessels within the lumbar spinal canal. 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 Associated ascites and omental caking consistent with peritoneal carcinomatosis. Suspect mild distal small bowel obstruction. Distal LEFT ureteral obstruction with LEFT hydronephrosis and hydroureter. Patchy LEFT nephrogram most likely representing pyelonephritis recommend correlation with urinalysis. Sludge versus dependent calculi within gallbladder. Large hiatal hernia. Tiny BILATERAL pleural effusions. Findings called to Dr. Broadus John on 09/03/2017 at 1741 hours. Electronically Signed   By: Lavonia Dana M.D.   On: 09/03/2017 17:48   US Paracentesis  Result Date: 09/04/2017 INDICATION: Pelvic mass, carcinomatosis and large volume ascites. EXAM: ULTRASOUND GUIDED PARACENTESIS MEDICATIONS: None. COMPLICATIONS: None immediate. PROCEDURE: Informed written consent was obtained from the patient after a discussion of the risks, benefits and alternatives to treatment. A timeout was performed prior to the initiation of the procedure. Initial ultrasound  was performed to localize ascites. The right lower abdomen was prepped and draped in the usual sterile fashion. 1% lidocaine was used for local anesthesia. Following this, a 6 Fr Safe-T-Centesis catheter was introduced. An ultrasound image was saved for documentation purposes. The  paracentesis was performed. The catheter was removed and a dressing was applied. The patient tolerated the procedure well without immediate post procedural complication. FINDINGS: A total of approximately 4.8 L of amber, slightly blood tinged fluid was removed. Samples were sent to the laboratory as requested by the clinical team. IMPRESSION: Successful ultrasound-guided paracentesis yielding 4.8 liters of peritoneal fluid. Electronically Signed   By: Aletta Edouard M.D.   On: 09/04/2017 13:31   Ct Biopsy  Result Date: 09/17/2017 CLINICAL DATA:  Pelvic mass, diffuse peritoneal carcinomatosis and ascites. Prior cytology of peritoneal fluid was not conclusive for gynecologic malignancy and suggested potential GI origin. Request has now been made to perform biopsy of peritoneal tumor for more definitive diagnosis. EXAM: CT GUIDED CORE BIOPSY OF PERITONEAL MASS COMPARISON:  CT of the abdomen and pelvis on 09/14/2017 and 09/03/2017 ANESTHESIA/SEDATION: 2.0 mg IV Versed; 100 mcg IV Fentanyl Total Moderate Sedation Time:  20 minutes. The patient's level of consciousness and physiologic status were continuously monitored during the procedure by Radiology nursing. PROCEDURE: The procedure risks, benefits, and alternatives were explained to the patient. Questions regarding the procedure were encouraged and answered. The patient understands and consents to the procedure. The abdominal wall was prepped with chlorhexidine in a sterile fashion, and a sterile drape was applied covering the operative field. A sterile gown and sterile gloves were used for the procedure. Local anesthesia was provided with 1% Lidocaine. CT was performed in a supine position. Under CT guidance, a 38 gauge trocar needle was advanced from an anterior oblique position into the left anterior peritoneal cavity. Core biopsy was performed with an 18 gauge core biopsy needle device. A total of 4 samples were obtained and submitted in formalin. Additional  CT images were obtained after outer needle removal. COMPLICATIONS: None FINDINGS: Small bowel shows decompression since prior CT on 09/14/2017 after nasogastric decompression. This now allows for a safe window to approach of peritoneal tumor in the anterior peritoneal cavity/omental region. Tumor just deep to the left anterior abdominal wall was targeted and revealed solid tissue with biopsy. IMPRESSION: CT-guided core biopsy performed of peritoneal tumor. Electronically Signed   By: Aletta Edouard M.D.   On: 09/17/2017 16:26   Dg Abd 2 Views  Result Date: 09/21/2017 CLINICAL DATA:  Follow-up small bowel obstruction. Clinically improved. EXAM: ABDOMEN - 2 VIEW COMPARISON:  Abdominal radiograph of January 2nd 2019 and CT scan of the abdomen of September 17, 2017. FINDINGS: There remain loops of mildly distended gas-filled small bowel to the right of midline and in the mid abdomen. There is a gas within the transverse colon and a small amount of gas within the rectum. There is gas within a hiatal hernia. The esophagogastric tube tip and proximal port project below the hemidiaphragms. No free extraluminal gas collections are observed. A double pigtail stent is present in the left kidney and ureter extending into the bladder. IMPRESSION: Persistent partial distal small bowel obstruction. No evidence of perforation. Hiatal hernia.  The esophagogastric tube is in reasonable position. Electronically Signed   By: David  Martinique M.D.   On: 09/21/2017 13:12   Dg Abd 2 Views  Result Date: 08/26/2017 CLINICAL DATA:  Acute onset of nausea, vomiting, diarrhea and generalized abdominal bloating. EXAM: ABDOMEN -  2 VIEW COMPARISON:  None. FINDINGS: The visualized bowel gas pattern is unremarkable. Scattered air and stool filled loops of colon are seen; no abnormal dilatation of small bowel loops is seen to suggest small bowel obstruction. No free intra-abdominal air is identified on the provided upright view. The visualized  osseous structures are within normal limits; the sacroiliac joints are unremarkable in appearance. Minimal haziness at the left lung base may reflect atelectasis or possibly mild infection. IMPRESSION: 1. Unremarkable bowel gas pattern; no free intra-abdominal air seen. Small to moderate amount of stool noted in the colon. 2. Minimal haziness at the left lung base may reflect atelectasis or possibly mild infection. Electronically Signed   By: Garald Balding M.D.   On: 08/26/2017 21:48   Dg Abd Acute W/chest  Result Date: 09/14/2017 CLINICAL DATA:  Vomiting. EXAM: DG ABDOMEN ACUTE W/ 1V CHEST COMPARISON:  08/26/2017 FINDINGS: Left-sided nephroureteral stent is identified. Small bowel air-fluid levels are identified. Small bowel loops are mildly increased in caliber measuring up to 2.6 cm. There is no evidence of free intraperitoneal air. Heart size and mediastinal contours are within normal limits. There are bilateral pleural effusions noted right greater than left. Both lungs are clear. IMPRESSION: 1. Mildly increased caliber of small bowel loops with air-fluid levels. Cannot rule out low grade partial obstruction. 2. Status post left ureteral stenting. 3. Bilateral pleural effusions. Electronically Signed   By: Kerby Moors M.D.   On: 09/14/2017 16:58   Dg C-arm 1-60 Min-no Report  Result Date: 09/05/2017 Fluoroscopy was utilized by the requesting physician.  No radiographic interpretation.   US Abdomen Limited Ruq  Result Date: 09/03/2017 CLINICAL DATA:  Pancreatitis EXAM: ULTRASOUND ABDOMEN LIMITED RIGHT UPPER QUADRANT COMPARISON:  None. FINDINGS: Gallbladder: Sludge in the gallbladder. Normal wall thickness. Negative sonographic Murphy. Common bile duct: Diameter: 2.5 mm Liver: Parenchymal echogenicity within normal limits. On some of the images, nodular liver contour is suggested. Portal vein is patent on color Doppler imaging with normal direction of blood flow towards the liver. The right  pleural effusion is incidentally noted. There is moderate ascites in the right upper quadrant. IMPRESSION: 1. Sludge in the gallbladder. Negative for wall thickening, tenderness, or biliary dilatation 2. Right pleural effusion and moderate ascites in the right upper quadrant 3. On some of the images, liver contour appears slightly nodular, query hepatocellular disease. Electronically Signed   By: Donavan Foil M.D.   On: 09/03/2017 14:56   Ir Paracentesis  Result Date: 09/13/2017 INDICATION: Patient with recurrent ascites. Request is made for diagnostic and therapeutic paracentesis. EXAM: ULTRASOUND GUIDED DIAGNOSTIC AND THERAPEUTIC PARACENTESIS MEDICATIONS: 10 mL 2% lidocaine COMPLICATIONS: None immediate. PROCEDURE: Informed written consent was obtained from the patient after a discussion of the risks, benefits and alternatives to treatment. A timeout was performed prior to the initiation of the procedure. Initial ultrasound scanning demonstrates a large amount of ascites within the left lateral abdomen. The left lateral abdomen was prepped and draped in the usual sterile fashion. 2% lidocaine was used for local anesthesia. Following this, a 19 gauge, 7-cm, Yueh catheter was introduced. An ultrasound image was saved for documentation purposes. The paracentesis was performed. The catheter was removed and a dressing was applied. The patient tolerated the procedure well without immediate post procedural complication. FINDINGS: A total of approximately 2.0 liters of red-colored fluid was removed. Samples were sent to the laboratory as requested by the clinical team. IMPRESSION: Successful ultrasound-guided paracentesis yielding 2.0 liters of peritoneal fluid. Read by:  Sherlie Ban  Zigmund Daniel PA-C Electronically Signed   By: Jerilynn Mages.  Shick M.D.   On: 09/13/2017 15:21    Assessment and Plan:   1. Carcinomatosis.    Initial presentation with nausea and bloating.   CT abdomen/pelvis 09/03/2017 showed a multilobulated  irregular heterogeneous enhancing soft tissue mass within the pelvis either involving both ovaries or a single mass arising from the right and extending across the midline with components measuring 10.6 x 4.9 x 9.8 cm and 8.2 x 4.9 x 4.8 cm.  There was associated ascites and omental caking consistent with peritoneal carcinomatosis.  A distal small bowel obstruction was suspected.  There was distal left ureteral obstruction with left hydronephrosis and hydroureter.    Ultrasound paracentesis was performed 09/04/2017.  Atypical cells were present, suspicious for adenocarcinoma of the GI tract.   Paracentesis 09/13/2017 with atypical cells present.  CT 09/14/2017 showed a progressing small bowel obstruction with diffusely dilated fluid-filled small bowel.  Transition zone in the right lower quadrant.  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  Right lower quadrant mass measuring 4.4 cm.    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  Nonspecific staining for PAX-8.  Negative for estrogen receptor, progesterone receptor and cytokeratin 7. 2. Small bowel obstruction secondary to carcinomatosis 3. Distal left ureteral obstruction with left hydronephrosis and hydroureter status post left ureteral stent placement 09/05/2017. 4. Seizure 1983. 5. Significant family history for breast cancer.    Ned Card, NP 09/21/2017, 4:24 PM   This was a shared visit with Ned Card.  Ms. Leaton was interviewed and examined.  I reviewed the CT images.  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  I discussed the case with Dr. Marlou Starks.  She will undergo additional diagnostic  evaluation with upper/lower endoscopy on 09/22/2017.  Dr. Marlou Starks plans surgery for 09/23/2017 in an attempt to alleviate the symptomatic bowel obstruction.  Recommendations: 1.  Proceed with upper endoscopy/colonoscopy to look for a primary tumor site 2.  Surgical bypass versus palliative diverting ostomy 3.  Port-A-Cath placement per Dr. Marlou Starks 4.  Submit core biopsy from 09/17/2017 or tissue obtained at the time of surgery 09/23/2017 for Foundation 1 testing 5.  Outpatient follow-up at the Cancer center will be scheduled  I will continue following her with the medicine and surgical services while she is hospitalized.

## 2017-09-21 NOTE — Progress Notes (Signed)
MD ordered resource breezes for patient in between meals.  Patient tolerated entire resource breeze, 250ml without any difficulty.  No nausea/vomiting. Will continue to monitor.

## 2017-09-21 NOTE — Progress Notes (Signed)
Agree with previous RN assessment. Will continue to monitor.

## 2017-09-21 NOTE — Progress Notes (Signed)
   Subjective/Chief Complaint: No complaints. Feels a little better. Still passing flatus and having small bm's   Objective: Vital signs in last 24 hours: Temp:  [97.8 F (36.6 C)-98.4 F (36.9 C)] 98.4 F (36.9 C) (01/08 0606) Pulse Rate:  [102-105] 103 (01/08 0606) Resp:  [18-20] 18 (01/08 0606) BP: (126-139)/(92-98) 129/98 (01/08 0606) SpO2:  [98 %-100 %] 99 % (01/08 0606) Last BM Date: 09/19/17  Intake/Output from previous day: 01/07 0701 - 01/08 0700 In: 2688 [I.V.:2133; IV Piggyback:555] Out: 900 [Urine:900] Intake/Output this shift: No intake/output data recorded.  General appearance: alert and cooperative Resp: clear to auscultation bilaterally Cardio: regular rate and rhythm GI: soft, nontender. still has some distension. good bs  Lab Results:  Recent Labs    09/20/17 0421 09/21/17 0426  WBC 8.9 9.3  HGB 13.0 12.3  HCT 40.8 39.1  PLT 401* 274   BMET Recent Labs    09/20/17 0421 09/21/17 0426  NA 145 141  K 2.8* 3.5  CL 114* 113*  CO2 24 24  GLUCOSE 152* 139*  BUN 15 14  CREATININE 0.65 0.56  CALCIUM 8.0* 7.8*   PT/INR No results for input(s): LABPROT, INR in the last 72 hours. ABG No results for input(s): PHART, HCO3 in the last 72 hours.  Invalid input(s): PCO2, PO2  Studies/Results: No results found.  Anti-infectives: Anti-infectives (From admission, onward)   None      Assessment/Plan: s/p * No surgery found * Path shows GI source of cancer  Will get abd xray. If nonobstructive bowel gas pattern then will d/c ng and allow clears Await oncology input If she still appears obstructed then will likely need palliative surgery soon  LOS: 6 days    TOTH III,PAUL S 09/21/2017

## 2017-09-21 NOTE — Progress Notes (Signed)
PHARMACY - ADULT TOTAL PARENTERAL NUTRITION CONSULT NOTE   Pharmacy Consult for TPN Indication: bowel obstruction  Patient Measurements: Height: 5\' 2"  (157.5 cm) Weight: 139 lb 12.8 oz (63.4 kg) IBW/kg (Calculated) : 50.1 TPN AdjBW (KG): 53.4 Body mass index is 25.57 kg/m. Usual Weight: 68 kg  Insulin Requirements: 4 units sensitive SSI / 24 hrs  Current Nutrition: NPO except for sips w/ meds Boost/Breeze BID  IVF: NS at 50 ml/hr  Central access: PICC placed 1/6 TPN start date: 1/6  ASSESSMENT                                                                                                          HPI: Pt with PMH of seizures and recently diagnosed peritoneal carcinomatosis with bowel obstruction/colonic obstruction secondary to malignancy of unknown primary presents with N/V, admitted for further management/evaluation. CT abdomen/pelviswith contrast shows progressing SBO. Having BMs but with 1450 ml from NGT, Surgery recommends starting TPN. Patient with significant recent weight loss but primarily d/t multiple large-volume paracentesis. Poor intake since week before Xmas, and unable to keep anything down since 12/31. Appears to be moderate risk for refeeding.  Significant events:   Today:   Glucose - within goal range (< 150) but near 150 on minimal SSI.  Range 127-150.  Electrolytes - Na, K, Mag WNL.  Cl elevated, CorrCa 8.44 low, and Phos remains low but improved after replacement.    Renal - SCr/BUN stable WNL  LFTs - slightly elevated on admit, now WNL (1/7)  TGs - slightly elevated at 180 (1/7)  Prealbumin - 8.4 (1.7)  NUTRITIONAL GOALS                                                                                             RD recs (09/15/17):  Kcal:  1700-1900 Protein:  80-90 grams Fluid:  >/= 1.7 L/d  Clinimix 5/15 at a goal rate of 75 ml/hr + 20% fat emulsion 240 ml to provide: 90 g/day protein, 1758 Kcal/day.  PLAN  Now:  KPhos 15 mmol (also provides 22 mEq K+) KCl 20 mEq IV (total ~ 40 mEq K)   At 1800 today:  Increase to Clinimix E 5/15 at 60 ml/hr (slow rate increase while correcting electrolyte abnormalities)  20% fat emulsion at reduced rate of 10 ml/hr x 12 hr  Plan to advance as tolerated to the goal rate  TPN to contain standard multivitamins and trace elements  IVF at 20 ml/hr  Continue sensitive SSI with q6h CBG checks  TPN lab panels on Mondays & Thursdays  F/u daily  Gretta Arab PharmD, BCPS Pager [phone] 09/21/2017 7:41 AM

## 2017-09-21 NOTE — Progress Notes (Signed)
I have been requested by the patient's attending physician, as requested by her oncologist, to proceed with endoscopic and colonoscopic evaluation because of the histologic character of her biopsies, which showed possible origin from the GI tract.  The nature, purpose, and risks of these procedures were reviewed and the patient is agreeable.  From conversation with Dr. Marlou Starks, surgery is anticipated for Thursday, and he would appreciate any information that might be derived ahead of time from the endoscopic procedures.  Since the patient is still recovering from her bowel obstruction, I do not think she is a realistic candidate for prepping for colonoscopy in the traditional fashion.  She did tolerate a tap water enema, with good results, earlier in this admission, so I will request a tap water enema later today and one again tomorrow morning.  She understands that we may not be able to examine the entire colon, but we will try to see as much as we can.  Cleotis Nipper, M.D. Pager [phone] If no answer or after 5 PM call [phone]

## 2017-09-21 NOTE — Progress Notes (Signed)
Patient resting comfortably in the chair in no acute distress.  Continuing to report passing flatus and had BM this am.  Informed patient that the biopsy obtained from her omentum is consistent with a GI malignancy.  No concerns voiced.  Dr. Benay Spice is aware.  Patient advised to contact myself or Dr. Denman George for any questions or concerns.

## 2017-09-21 NOTE — Progress Notes (Signed)
Physical Therapy Discharge Patient Details Name: Christie Maynard MRN: [id number] DOB: Sep 26, 1955 Today's Date: 09/21/2017 Time:  -     Patient discharged from PT services secondary to prepping for procedure on Thursday.patient has been ambulatory  As indicated by PT eval from 1/2. Please reorder  PT to resume therapy services.  Please see latest therapy progress note for current level of functioning and progress toward goals.     GP     Marcelino Freestone PT [phone]  09/21/2017, 5:03 PM

## 2017-09-21 NOTE — Progress Notes (Signed)
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 Patient reports poor oral intake and unable to eat solid foods for past few weeks but 24 hours prior to this admission, she felt Christie Maynard lot worse, bloated, uncomfortable. Off note patient had appointment with gastroenterologist scheduled for 09/15/2017 due to cytology results concerning for adenocarcinoma.  Assessment & Plan:   Principal Problem:   Nausea & vomiting Active Problems:   Ascites   Peritoneal carcinomatosis (HCC)   History of seizures   Nausea and vomiting  Peritoneal Carcinomatosis  Ascites  Nausea and vomiting  Small Bowel Obstruction:  Pt was following with Dr. 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  Plan was for carboplatin/paclitaxel x 3 cycles followed by debulking, but it looks like cytology from paracentesis on 12/22 has come back atypical cells suspicious for adenocarcinoma of GI tract and now Christie Maynard biopsy of the peritoneum has come back with "adenocarcinoma with extracellular mucin and signet ring features, most c/w GI primary." - CT abdomen/pelvis with contrast with progressing SBO  - NGT clamped - repeat x ray 1/8 with persistent partial SBO - surgery c/s - TPN for nutrition support, possible palliative surgery soon - onc c/s - discussed with Dr. Benay Spice today who rec discussing with GI for possible colonoscopy - GI c/s, appreciate recs - gyn onc c/s  - antiemetics   Hypokalemia  Hypophosphatemia: f/u mag, replete prn   Anion gap metabolic acidosis: improved  L ureteral obstruction s/p stent placement: Follow creatinine  Abnormal UA: urine cx  negatve  Leukocytosis: improved  Mildly elevated liver enzymes: improved  Mildly elevated lipase: imaging without pancreatitis  History of seizures: has had 1 seizure in 1980's, none since.  Stable on dilantin. Will give IV dilantin while NPO.  DVT prophylaxis: lovenox Code Status: full  Family Communication: none at bedside Disposition Plan: pending   Consultants:   Gyn onc  Surgery  GI  Procedures:   NG tube placement  Antimicrobials: (specify start and planned stop date. Auto populated tables are space occupying and do not give end dates) Anti-infectives (From admission, onward)   None      Subjective: No complaints  Objective: Vitals:   09/20/17 1302 09/20/17 2131 09/21/17 0606 09/21/17 1525  BP: (!) 139/92 (!) 126/93 (!) 129/98 130/83  Pulse: (!) 102 (!) 105 (!) 103 89  Resp: 20 20 18 19   Temp: 97.8 F (36.6 C) 97.8 F (36.6 C) 98.4 F (36.9 C) 98.3 F (36.8 C)  TempSrc: Oral Oral Oral Oral  SpO2: 98% 100% 99% 98%  Weight:      Height:        Intake/Output Summary (Last 24 hours) at 09/21/2017 1732 Last data filed at 09/21/2017 1526 Gross per 24 hour  Intake 2315 ml  Output 701 ml  Net 1614 ml   Filed Weights   09/14/17 1127 09/14/17 2110  Weight: 68 kg (150 lb) 63.4 kg (139 lb 12.8 oz)    Examination:  General: No acute distress. Cardiovascular: Heart sounds show Christie Maynard regular rate, and rhythm. No gallops or rubs. No murmurs. No JVD. Lungs: Clear to auscultation bilaterally with good air movement. No rales, rhonchi or wheezes. Abdomen: Soft, nontender, nondistended.  No masses. No hepatosplenomegaly.  NG in place. Neurological: Alert and oriented 3. Moves all extremities 4 with equal strength. Cranial nerves II through XII grossly intact. Skin: Warm and dry. No rashes or lesions. Extremities: No clubbing or cyanosis. No edema. Pedal pulses 2+. Psychiatric: Mood and affect are normal. Insight and judgment are appropriate.   Data  Reviewed: I have personally reviewed following labs and imaging studies  CBC: Recent Labs  Lab 09/17/17 0427 09/18/17 0403 09/19/17 0548 09/20/17 0421 09/21/17 0426  WBC 7.7 8.1 7.9 8.9 9.3  NEUTROABS 5.6  --   --  6.8  --   HGB 12.9 13.3 13.3 13.0 12.3  HCT 39.4 42.1 41.8 40.8 39.1  MCV 88.5 89.4 89.3 89.3 89.3  PLT 401* 398 394 401* 810   Basic Metabolic Panel: Recent Labs  Lab 09/17/17 0427 09/18/17 0403 09/19/17 0548 09/20/17 0421 09/21/17 0426  NA 141 142 143 145 141  K 3.8 3.4* 4.3 2.8* 3.5  CL 110 109 110 114* 113*  CO2 22 24 25 24 24   GLUCOSE 104* 101* 98 152* 139*  BUN 17 16 14 15 14   CREATININE 0.71 0.73 0.70 0.65 0.56  CALCIUM 8.4* 8.1* 8.1* 8.0* 7.8*  MG  --  2.2 2.2 2.1 2.3  PHOS  --  2.6 2.3* 2.2* 2.4*   GFR: Estimated Creatinine Clearance: 63 mL/min (by C-G formula based on SCr of 0.56 mg/dL). Liver Function Tests: Recent Labs  Lab 09/15/17 0408 09/17/17 0427 09/20/17 0421 09/21/17 0426  AST 50* 20 24 30   ALT 40 25 21 26   ALKPHOS 131* 100 79 77  BILITOT 1.3* 1.1 0.9 0.8  PROT 6.1* 5.9* 5.9* 5.6*  ALBUMIN 2.4* 2.4* 2.4* 2.2*   No results for input(s): LIPASE, AMYLASE in the last 168 hours. No results for input(s): AMMONIA in the last 168 hours. Coagulation Profile: Recent Labs  Lab 09/17/17 0427  INR 1.51   Cardiac Enzymes: No results for input(s): CKTOTAL, CKMB, CKMBINDEX, TROPONINI in the last 168 hours. BNP (last 3 results) No results for input(s): PROBNP in the last 8760 hours. HbA1C: No results for input(s): HGBA1C in the last 72 hours. CBG: Recent Labs  Lab 09/20/17 1201 09/20/17 1720 09/21/17 0016 09/21/17 0604 09/21/17 1148  GLUCAP 143* 150* 127* 127* 168*   Lipid Profile: Recent Labs    09/20/17 0421  TRIG 180*   Thyroid Function Tests: No results for input(s): TSH, T4TOTAL, FREET4, T3FREE, THYROIDAB in the last 72 hours. Anemia Panel: No results for input(s): VITAMINB12, FOLATE, FERRITIN, TIBC, IRON,  RETICCTPCT in the last 72 hours. Sepsis Labs: Recent Labs  Lab 09/14/17 2107 09/14/17 2343  LATICACIDVEN 1.6 0.9    Recent Results (from the past 240 hour(s))  Culture, Urine     Status: None   Collection Time: 09/14/17  4:30 PM  Result Value Ref Range Status   Specimen Description URINE, RANDOM  Final   Special Requests NONE  Final   Culture   Final    NO GROWTH Performed at [facility], [address] [place], [place] [address]    Report Status 09/16/2017 FINAL  Final         Radiology Studies: Dg Abd 2 Views  Result Date: 09/21/2017 CLINICAL DATA:  Follow-up small bowel obstruction. Clinically improved. EXAM: ABDOMEN - 2 VIEW COMPARISON:  Abdominal radiograph of January 2nd 2019 and CT scan of the abdomen of September 17, 2017. FINDINGS: There remain loops of mildly distended gas-filled small bowel to  the right of midline and in the mid abdomen. There is Lin Hackmann gas within the transverse colon and Kerin Kren small amount of gas within the rectum. There is gas within Atonya Templer hiatal hernia. The esophagogastric tube tip and proximal port project below the hemidiaphragms. No free extraluminal gas collections are observed. Charliene Inoue double pigtail stent is present in the left kidney and ureter extending into the bladder. IMPRESSION: Persistent partial distal small bowel obstruction. No evidence of perforation. Hiatal hernia.  The esophagogastric tube is in reasonable position. Electronically Signed   By: David  Martinique M.D.   On: 09/21/2017 13:12        Scheduled Meds: . enoxaparin (LOVENOX) injection  40 mg Subcutaneous Q24H  . feeding supplement  1 Container Oral BID BM  . insulin aspart  0-9 Units Subcutaneous Q6H  . phenytoin (DILANTIN) IV  100 mg Intravenous QHS   Continuous Infusions: . Marland KitchenTPN (CLINIMIX-E) Adult     And  . fat emulsion    . sodium chloride 40 mL/hr at 09/21/17 1044  . sodium chloride    . potassium PHOSPHATE IVPB (mmol) 15 mmol (09/21/17 1313)  . Marland KitchenTPN (CLINIMIX-E) Adult 40  mL/hr at 09/20/17 1821     LOS: 6 days    Time spent: over 23 min    Fayrene Helper, MD [facility] [phone]  If 7PM-7AM, please contact night-coverage [email or web address] Password [facility] 09/21/2017, 5:32 PM

## 2017-09-22 ENCOUNTER — Inpatient Hospital Stay (HOSPITAL_COMMUNITY): Payer: BLUE CROSS/BLUE SHIELD

## 2017-09-22 ENCOUNTER — Encounter (HOSPITAL_COMMUNITY)
Admission: EM | Disposition: A | Discharge status: Home / Self Care | Payer: Self-pay | Source: Home / Self Care | Attending: Internal Medicine

## 2017-09-22 ENCOUNTER — Inpatient Hospital Stay (HOSPITAL_COMMUNITY): Payer: BLUE CROSS/BLUE SHIELD | Admitting: Anesthesiology

## 2017-09-22 ENCOUNTER — Encounter (HOSPITAL_COMMUNITY): Payer: Self-pay

## 2017-09-22 DIAGNOSIS — K56609 Unspecified intestinal obstruction, unspecified as to partial versus complete obstruction: Secondary | ICD-10-CM | POA: Diagnosis present

## 2017-09-22 DIAGNOSIS — E860 Dehydration: Secondary | ICD-10-CM | POA: Clinically undetermined

## 2017-09-22 DIAGNOSIS — E43 Unspecified severe protein-calorie malnutrition: Secondary | ICD-10-CM

## 2017-09-22 DIAGNOSIS — C189 Malignant neoplasm of colon, unspecified: Secondary | ICD-10-CM | POA: Diagnosis present

## 2017-09-22 DIAGNOSIS — Z87898 Personal history of other specified conditions: Secondary | ICD-10-CM

## 2017-09-22 DIAGNOSIS — K639 Disease of intestine, unspecified: Secondary | ICD-10-CM

## 2017-09-22 DIAGNOSIS — K6389 Other specified diseases of intestine: Secondary | ICD-10-CM | POA: Diagnosis present

## 2017-09-22 HISTORY — PX: COLONOSCOPY WITH PROPOFOL: SHX5780

## 2017-09-22 HISTORY — PX: ESOPHAGOGASTRODUODENOSCOPY (EGD) WITH PROPOFOL: SHX5813

## 2017-09-22 LAB — CBC
HEMATOCRIT: 38.1 % (ref 36.0–46.0)
HEMOGLOBIN: 12.2 g/dL (ref 12.0–15.0)
MCH: 28.4 pg (ref 26.0–34.0)
MCHC: 32 g/dL (ref 30.0–36.0)
MCV: 88.6 fL (ref 78.0–100.0)
Platelets: 238 10*3/uL (ref 150–400)
RBC: 4.3 MIL/uL (ref 3.87–5.11)
RDW: 14.9 % (ref 11.5–15.5)
WBC: 9.5 10*3/uL (ref 4.0–10.5)

## 2017-09-22 LAB — BASIC METABOLIC PANEL
Anion gap: 5 (ref 5–15)
BUN: 16 mg/dL (ref 6–20)
CHLORIDE: 111 mmol/L (ref 101–111)
CO2: 24 mmol/L (ref 22–32)
CREATININE: 0.58 mg/dL (ref 0.44–1.00)
Calcium: 7.6 mg/dL — ABNORMAL LOW (ref 8.9–10.3)
GFR calc Af Amer: >60 mL/min (ref >60)
GFR calc non Af Amer: >60 mL/min (ref >60)
Glucose, Bld: 131 mg/dL — ABNORMAL HIGH (ref 65–99)
Potassium: 3.5 mmol/L (ref 3.5–5.1)
Sodium: 140 mmol/L (ref 135–145)

## 2017-09-22 LAB — GLUCOSE, CAPILLARY
GLUCOSE-CAPILLARY: 153 mg/dL — AB (ref 65–99)
Glucose-Capillary: 111 mg/dL — ABNORMAL HIGH (ref 65–99)
Glucose-Capillary: 127 mg/dL — ABNORMAL HIGH (ref 65–99)
Glucose-Capillary: 139 mg/dL — ABNORMAL HIGH (ref 65–99)
Glucose-Capillary: 144 mg/dL — ABNORMAL HIGH (ref 65–99)
Glucose-Capillary: 145 mg/dL — ABNORMAL HIGH (ref 65–99)

## 2017-09-22 LAB — TYPE AND SCREEN
ABO/RH(D): A POS
ANTIBODY SCREEN: NEGATIVE

## 2017-09-22 LAB — MAGNESIUM: Magnesium: 2.1 mg/dL (ref 1.7–2.4)

## 2017-09-22 LAB — ABO/RH: ABO/RH(D): A POS

## 2017-09-22 LAB — PHOSPHORUS: Phosphorus: 3 mg/dL (ref 2.5–4.6)

## 2017-09-22 SURGERY — ESOPHAGOGASTRODUODENOSCOPY (EGD) WITH PROPOFOL
Anesthesia: General

## 2017-09-22 MED ORDER — SODIUM CHLORIDE 0.9 % IV SOLN
INTRAVENOUS | Status: DC
  Administered 2017-09-22 – 2017-10-06 (×3): via INTRAVENOUS

## 2017-09-22 MED ORDER — TRACE MINERALS CR-CU-MN-SE-ZN 10-1000-500-60 MCG/ML IV SOLN
INTRAVENOUS | Status: AC
  Administered 2017-09-22: 18:00:00 via INTRAVENOUS
  Filled 2017-09-22: qty 1800

## 2017-09-22 MED ORDER — DEXTROSE 5 % IV SOLN
2.0000 g | Freq: Once | INTRAVENOUS | Status: AC
  Administered 2017-09-23: 2 g via INTRAVENOUS
  Filled 2017-09-22: qty 2

## 2017-09-22 MED ORDER — ONDANSETRON HCL 4 MG/2ML IJ SOLN
INTRAMUSCULAR | Status: DC | PRN
  Administered 2017-09-22: 4 mg via INTRAVENOUS

## 2017-09-22 MED ORDER — PROPOFOL 10 MG/ML IV BOLUS
INTRAVENOUS | Status: DC | PRN
  Administered 2017-09-22: 20 mg via INTRAVENOUS
  Administered 2017-09-22: 120 mg via INTRAVENOUS
  Administered 2017-09-22: 20 mg via INTRAVENOUS

## 2017-09-22 MED ORDER — SUCCINYLCHOLINE CHLORIDE 20 MG/ML IJ SOLN
INTRAMUSCULAR | Status: DC | PRN
  Administered 2017-09-22: 100 mg via INTRAVENOUS

## 2017-09-22 MED ORDER — LACTATED RINGERS IV SOLN
INTRAVENOUS | Status: DC | PRN
  Administered 2017-09-22: 13:00:00 via INTRAVENOUS

## 2017-09-22 MED ORDER — LIDOCAINE HCL (CARDIAC) 20 MG/ML IV SOLN
INTRAVENOUS | Status: DC | PRN
  Administered 2017-09-22: 60 mg via INTRAVENOUS

## 2017-09-22 MED ORDER — FAT EMULSION 20 % IV EMUL
240.0000 mL | INTRAVENOUS | Status: AC
  Administered 2017-09-22: 240 mL via INTRAVENOUS
  Filled 2017-09-22: qty 250

## 2017-09-22 MED ORDER — POTASSIUM CHLORIDE 10 MEQ/50ML IV SOLN
10.0000 meq | INTRAVENOUS | Status: DC
  Filled 2017-09-22 (×2): qty 50

## 2017-09-22 MED ORDER — SODIUM CHLORIDE 0.9 % IV SOLN: INTRAVENOUS | Status: DC

## 2017-09-22 MED ORDER — DEXAMETHASONE SODIUM PHOSPHATE 10 MG/ML IJ SOLN
INTRAMUSCULAR | Status: DC | PRN
  Administered 2017-09-22: 10 mg via INTRAVENOUS

## 2017-09-22 MED ORDER — POTASSIUM CHLORIDE 10 MEQ/50ML IV SOLN
10.0000 meq | INTRAVENOUS | Status: AC
  Administered 2017-09-22 (×3): 10 meq via INTRAVENOUS
  Filled 2017-09-22 (×4): qty 50

## 2017-09-22 SURGICAL SUPPLY — 24 items

## 2017-09-22 NOTE — Plan of Care (Signed)
  Progressing Nutrition: Adequate nutrition will be maintained 09/22/2017 2224 - Progressing by Talbert Forest, RN Elimination: Will not experience complications related to urinary retention 09/22/2017 2224 - Progressing by Talbert Forest, RN Health Behavior/Discharge Planning: Ability to manage health-related needs will improve 09/22/2017 2224 - Progressing by Talbert Forest, RN

## 2017-09-22 NOTE — Transfer of Care (Signed)
Immediate Anesthesia Transfer of Care Note  Patient: Christie Maynard  Procedure(s) Performed: ESOPHAGOGASTRODUODENOSCOPY (EGD) WITH PROPOFOL (N/A ) COLONOSCOPY WITH PROPOFOL (N/A )  Patient Location: PACU  Anesthesia Type:General  Level of Consciousness: awake, alert  and oriented  Airway & Oxygen Therapy: Patient Spontanous Breathing and Patient connected to face mask oxygen  Post-op Assessment: Report given to RN and Post -op Vital signs reviewed and stable  Post vital signs: Reviewed and stable  Last Vitals:  Vitals:   09/22/17 0536 09/22/17 1217  BP: (!) 131/91 123/78  Pulse: 93 (!) 116  Resp: 20 (!) 28  Temp: 36.8 C 37.3 C  SpO2: 99% 97%    Last Pain:  Vitals:   09/22/17 1217  TempSrc: Oral  PainSc:       Patients Stated Pain Goal: 3 (22/57/50 5183)  Complications: No apparent anesthesia complications

## 2017-09-22 NOTE — Op Note (Signed)
[facility] Patient Name: Christie Maynard Procedure Date: 09/22/2017 MRN: [id number] Attending MD: Ronald Lobo , MD Date of Birth: Nov 07, 1955 CSN: [id number] Age: 62 Admit Type: Inpatient Procedure:                Upper GI endoscopy Indications:              Abnormal abdominal x-ray of the GI tract, SBO w/                            peritoneal bx + for adenocarcinoma Providers:                Ronald Lobo, MD, Cleda Daub, RN, Tinnie Gens,                            Technician, Rosario Adie, CRNA Referring MD:              Medicines:                Monitored Anesthesia Care Complications:            No immediate complications. Estimated Blood Loss:     Estimated blood loss: none. Procedure:                Pre-Anesthesia Assessment:                           - Prior to the procedure, a History and Physical                            was performed, and patient medications and                            allergies were reviewed. The patient's tolerance of                            previous anesthesia was also reviewed. The risks                            and benefits of the procedure and the sedation                            options and risks were discussed with the patient.                            All questions were answered, and informed consent                            was obtained. Prior Anticoagulants: The patient has                            taken Lovenox (enoxaparin), last dose was 1 day                            prior to procedure. ASA Grade Assessment: III - A  patient with severe systemic disease. After                            reviewing the risks and benefits, the patient was                            deemed in satisfactory condition to undergo the                            procedure.                           After obtaining informed consent, the endoscope was                            passed under direct  vision. Throughout the                            procedure, the patient's blood pressure, pulse, and                            oxygen saturations were monitored continuously. The                            patient was intubated for the procedure to protect                            the airway b/o h/o SBO. The [id number] ([id number])                            pediatric upper endoscope was introduced through                            the mouth, alongside the NG tube, and advanced to                            the second part of duodenum. The upper GI endoscopy                            was accomplished without difficulty. The patient                            tolerated the procedure well. Scope In: Scope Out: Findings:      Mild esophagitis,characterized by scattered erosive changes, with no       bleeding was found in the distal half of the esophagus. No deep       ulcerations were seen.      The entire examined stomach was normal. The NG tube was coiled in the       fundus.      The examined duodenum was normal. Impression:               - Reflux esophagitis, probably due to "wicking"  from the NG tube.                           - Normal stomach.                           - Normal examined duodenum.                           - No specimens collected. Moderate Sedation:      This patient was sedated with monitored anesthesia care, not moderate       sedation. Recommendation:           - Perform a colonoscopy today.                           - Make the patient NPO starting today.                           - Continue present medications. Procedure Code(s):        --- Professional ---                           [phone], Esophagogastroduodenoscopy, flexible,                            transoral; diagnostic, including collection of                            specimen(s) by brushing or washing, when performed                            (separate  procedure) Diagnosis Code(s):        --- Professional ---                           K21.0, Gastro-esophageal reflux disease with                            esophagitis                           R93.3, Abnormal findings on diagnostic imaging of                            other parts of digestive tract CPT copyright 2016 [employer]. All rights reserved. The codes documented in this report are preliminary and upon coder review may  be revised to meet current compliance requirements. Ronald Lobo, MD 09/22/2017 2:22:39 PM This report has been signed electronically. Number of Addenda: 0

## 2017-09-22 NOTE — Anesthesia Procedure Notes (Signed)
Procedure Name: Intubation Date/Time: 09/22/2017 1:16 PM Performed by: Glory Buff, CRNA Pre-anesthesia Checklist: Patient identified, Emergency Drugs available, Suction available and Patient being monitored Patient Re-evaluated:Patient Re-evaluated prior to induction Oxygen Delivery Method: Circle system utilized Preoxygenation: Pre-oxygenation with 100% oxygen Induction Type: IV induction, Cricoid Pressure applied and Rapid sequence Ventilation: Mask ventilation without difficulty Laryngoscope Size: Miller and 2 Grade View: Grade II Tube type: Oral Number of attempts: 1 Airway Equipment and Method: Stylet and Oral airway Placement Confirmation: ETT inserted through vocal cords under direct vision,  positive ETCO2 and breath sounds checked- equal and bilateral Secured at: 20 cm Tube secured with: Tape Dental Injury: Teeth and Oropharynx as per pre-operative assessment

## 2017-09-22 NOTE — Progress Notes (Signed)
EGD & Colonoscopy well-tolerated.  Patient appears to have a malignant neoplasm involving the region of the cecum.  I obtained biopsies in case the anticipated surgery for tomorrow is delayed for any reason.  I have discussed the findings with Dr. Irine Seal (hospitalist) and Dr. Marlou Starks (general surgery).  Patient aware of findings.  I will sign off.  Please call if we can be of further assistance with this patient.  Cleotis Nipper, M.D. Pager [phone] If no answer or after 5 PM call [phone]

## 2017-09-22 NOTE — Op Note (Signed)
[facility] Patient Name: Christie Maynard Procedure Date: 09/22/2017 MRN: [id number] Attending MD: Ronald Lobo , MD Date of Birth: [birth year] CSN: [id number] Age: 62 Admit Type: Inpatient Procedure:                Colonoscopy Indications:              Abnormal CT of the GI tract w/ SBO and peritoneal                            bx's positive for adenocarcinoma, markedly elevated                            CEA Providers:                Ronald Lobo, MD, Cleda Daub, RN, Tinnie Gens,                            Technician, Rosario Adie, CRNA Referring MD:              Medicines:                Monitored Anesthesia Care Complications:            No immediate complications. Estimated Blood Loss:     Estimated blood loss was minimal. Procedure:                Pre-Anesthesia Assessment:                           - Prior to the procedure, a History and Physical                            was performed, and patient medications and                            allergies were reviewed. The patient's tolerance of                            previous anesthesia was also reviewed. The risks                            and benefits of the procedure and the sedation                            options and risks were discussed with the patient.                            All questions were answered, and informed consent                            was obtained. Prior Anticoagulants: The patient has                            taken Lovenox (enoxaparin), last dose was 1 day  prior to procedure. ASA Grade Assessment: III - A                            patient with severe systemic disease. After                            reviewing the risks and benefits, the patient was                            deemed in satisfactory condition to undergo the                            procedure.                           After obtaining informed consent, the colonoscope                           was passed under direct vision. Throughout the                            procedure, the patient's blood pressure, pulse, and                            oxygen saturations were monitored continuously. The                            EC-[id number] ([id number]) scope was introduced through                            the anus and advanced to the the cecum, identified                            by its appearance. The colonoscopy was somewhat                            difficult due to significant looping. Successful                            completion of the procedure was aided by using                            manual pressure. The patient tolerated the                            procedure well. The quality of the bowel                            preparation was adequate (had to use tap water                            enemas, in view of SBO). The ileocecal valve and  the rectum were photographed. Scope In: 1:33:24 PM Scope Out: 1:45:38 PM Scope Withdrawal Time: 0 hours 3 minutes 6 seconds  Total Procedure Duration: 0 hours 12 minutes 14 seconds  Findings:      The perianal examination was normal.      An ulcerated large mass, causing marked deformity but seemingly arising       from the ileocecal valve area, was found in the cecum. The mass was       non-circumferential. No bleeding was present. Mucosa was biopsied with a       cold forceps for histology.      The exam was otherwise normal throughout the examined colon.       Retroflexion not attempted in the rectum but antegrade viewing was       unremarkable. Impression:               - Ulcerated mass in the region of the cecum, likely                            malignant, causing marked deformity but seemingly                            arising from the ileocecal valve area. Biopsied.                           Otherwise grossly normal colonoscopy, without                            evidence  of synchronous lesions. Moderate Sedation:      This patient was sedated with monitored anesthesia care, not moderate       sedation. Recommendation:           - Await pathology results.                           - Patient is already scheduled for surgery tomorrow.                           - Make the patient NPO starting today.                           - Continue present medications.                           - Repeat colonoscopy (date not yet determined) for                            surveillance, depending on patient's clinical                            evolution. Procedure Code(s):        --- Professional ---                           [phone], Colonoscopy, flexible; diagnostic, including                            collection of specimen(s) by brushing  or washing,                            when performed (separate procedure) Diagnosis Code(s):        --- Professional ---                           D49.0, Neoplasm of unspecified behavior of                            digestive system                           R93.3, Abnormal findings on diagnostic imaging of                            other parts of digestive tract CPT copyright 2016 [employer]. All rights reserved. The codes documented in this report are preliminary and upon coder review may  be revised to meet current compliance requirements. Ronald Lobo, MD 09/22/2017 2:30:50 PM This report has been signed electronically. Number of Addenda: 0

## 2017-09-22 NOTE — Anesthesia Postprocedure Evaluation (Signed)
Anesthesia Post Note  Patient: Christie Maynard  Procedure(s) Performed: ESOPHAGOGASTRODUODENOSCOPY (EGD) WITH PROPOFOL (N/A ) COLONOSCOPY WITH PROPOFOL (N/A )     Patient location during evaluation: PACU Anesthesia Type: General Level of consciousness: awake and alert Pain management: pain level controlled Vital Signs Assessment: post-procedure vital signs reviewed and stable Respiratory status: spontaneous breathing, nonlabored ventilation, respiratory function stable and patient connected to nasal cannula oxygen Cardiovascular status: blood pressure returned to baseline and stable Postop Assessment: no apparent nausea or vomiting Anesthetic complications: no    Last Vitals:  Vitals:   09/22/17 1440 09/22/17 1508  BP: 123/78 121/79  Pulse: 100 98  Resp: (!) 24 20  Temp:  36.9 C  SpO2: 97% 97%    Last Pain:  Vitals:   09/22/17 1508  TempSrc: Oral  PainSc:                  Tiajuana Amass

## 2017-09-22 NOTE — Progress Notes (Signed)
Day of Surgery   Subjective/Chief Complaint: Complains of some soreness of the abd. Endo showed mass in cecum   Objective: Vital signs in last 24 hours: Temp:  [98.1 F (36.7 C)-99.1 F (37.3 C)] 98.4 F (36.9 C) (01/09 1508) Pulse Rate:  [84-116] 98 (01/09 1508) Resp:  [19-28] 20 (01/09 1508) BP: (121-131)/(73-91) 121/79 (01/09 1508) SpO2:  [97 %-100 %] 97 % (01/09 1508) Weight:  [63 kg (139 lb)] 63 kg (139 lb) (01/09 1217) Last BM Date: 09/22/17  Intake/Output from previous day: 01/08 0701 - 01/09 0700 In: 2126.3 [P.O.:237; I.V.:1634.3; IV Piggyback:255] Out: 301 [Urine:300; Stool:1] Intake/Output this shift: Total I/O In: 500 [I.V.:500] Out: -   General appearance: alert and cooperative Resp: clear to auscultation bilaterally Cardio: regular rate and rhythm GI: soft, distended  Lab Results:  Recent Labs    09/21/17 0426 09/22/17 0334  WBC 9.3 9.5  HGB 12.3 12.2  HCT 39.1 38.1  PLT 274 238   BMET Recent Labs    09/21/17 0426 09/22/17 0334  NA 141 140  K 3.5 3.5  CL 113* 111  CO2 24 24  GLUCOSE 139* 131*  BUN 14 16  CREATININE 0.56 0.58  CALCIUM 7.8* 7.6*   PT/INR No results for input(s): LABPROT, INR in the last 72 hours. ABG No results for input(s): PHART, HCO3 in the last 72 hours.  Invalid input(s): PCO2, PO2  Studies/Results: Dg Abd 2 Views  Result Date: 09/21/2017 CLINICAL DATA:  Follow-up small bowel obstruction. Clinically improved. EXAM: ABDOMEN - 2 VIEW COMPARISON:  Abdominal radiograph of January 2nd 2019 and CT scan of the abdomen of September 17, 2017. FINDINGS: There remain loops of mildly distended gas-filled small bowel to the right of midline and in the mid abdomen. There is a gas within the transverse colon and a small amount of gas within the rectum. There is gas within a hiatal hernia. The esophagogastric tube tip and proximal port project below the hemidiaphragms. No free extraluminal gas collections are observed. A double pigtail  stent is present in the left kidney and ureter extending into the bladder. IMPRESSION: Persistent partial distal small bowel obstruction. No evidence of perforation. Hiatal hernia.  The esophagogastric tube is in reasonable position. Electronically Signed   By: David  Martinique M.D.   On: 09/21/2017 13:12    Anti-infectives: Anti-infectives (From admission, onward)   None      Assessment/Plan: s/p Procedure(s) with comments: ESOPHAGOGASTRODUODENOSCOPY (EGD) WITH PROPOFOL (N/A) - Leave NG in place, please use PEDIATRIC UPPER endoscope COLONOSCOPY WITH PROPOFOL (N/A) - The patient will have a tap water enema prep because she is recovering from bowel obstruction continue ng and bowel rest but may have ice chips  Plan for exploration tomorrow. Risks and benefits of the surgery discussed with patient and the family and they understand and wish to proceed  LOS: 7 days    TOTH III,Ibeth Fahmy S 09/22/2017

## 2017-09-22 NOTE — Progress Notes (Signed)
PHARMACY - ADULT TOTAL PARENTERAL NUTRITION CONSULT NOTE   Pharmacy Consult for TPN Indication: bowel obstruction  Patient Measurements: Height: 5\' 2"  (157.5 cm) Weight: 139 lb 12.8 oz (63.4 kg) IBW/kg (Calculated) : 50.1 TPN AdjBW (KG): 53.4 Body mass index is 25.57 kg/m. Usual Weight: 68 kg  Insulin Requirements: 4 units sensitive SSI / 24 hrs  Current Nutrition: NPO except for ice chips prior to colonoscopy/endoscopy on 1/9 Boost/Breeze BID  IVF: NS at 20 ml/hr  Central access: PICC placed 1/6 TPN start date: 1/6  ASSESSMENT                                                                                                          HPI: Pt with PMH of seizures and recently diagnosed peritoneal carcinomatosis with bowel obstruction/colonic obstruction secondary to malignancy of unknown primary presents with N/V, admitted for further management/evaluation. CT abdomen/pelviswith contrast shows progressing SBO. Having BMs but with 1450 ml from NGT, Surgery recommends starting TPN. Patient with significant recent weight loss but primarily d/t multiple large-volume paracentesis. Poor intake since week before Xmas, and unable to keep anything down since 12/31. Appears to be moderate risk for refeeding.  Significant events:  1/9 endoscopy/colonoscopy to look for a primary tumor site; planning for surgical bypass versus palliative diverting ostomy on 1/10   Today:   Glucose - within goal range (< 150) but near 150 on minimal SSI.  Range 127-150.  Electrolytes - Na, K, Mag, Phos, CorrCa WNL.      Renal - SCr/BUN stable WNL  LFTs - slightly elevated on admit, now WNL (1/7)  TGs - slightly elevated at 180 (1/7)  Prealbumin - 8.4 (1.7)  NUTRITIONAL GOALS                                                                                             RD recs (09/20/17):  Kcal:  1700-1900 Protein:  80-90 grams Fluid:  >/= 1.7 L/d  Clinimix 5/15 at a goal rate of 75 ml/hr + 20% fat  emulsion 240 ml to provide: 90 g/day protein, 1758 Kcal/day.  PLAN  Now:  KCl 10 mEq IV x4 runs per MD  At 1800 today:  Increase to Clinimix E 5/15 at goal rate of 75 ml/hr (slow rate increase while correcting electrolyte abnormalities)  20% fat emulsion at 20 ml/hr x 12 hr  Plan to advance as tolerated to the goal rate  TPN to contain standard multivitamins and trace elements  [facility]F [facility] [employer] sensitive SSI with q6h CBG checks  TPN lab panels on Mondays & Thursdays  F/u daily  Gretta Arab PharmD, BCPS Pager [phone] 09/22/2017 7:41 AM

## 2017-09-22 NOTE — Progress Notes (Signed)
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 Patient reports poor oral intake and unable to eat solid foods for past few weeks but 24 hours prior to this admission, she felt a lot worse, bloated, uncomfortable. Off note patient had appointment with gastroenterologist scheduled for 09/15/2017 due to cytology results concerning for adenocarcinoma.    Assessment & Plan:   Principal Problem:   SBO (small bowel obstruction) (HCC) Active Problems:   Colonic mass   Ascites   Nausea & vomiting   Peritoneal carcinomatosis (HCC)   History of seizures   Nausea and vomiting   Peritoneal Carcinomatosis  Ascites  Nausea and vomiting  Small Bowel Obstruction/cecal mass:  Pt was following with Dr. 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  - repeat x ray 1/8 with persistent partial SBO - surgery c/s - TPN for nutrition support.  Patient underwent upper endoscopy which was unremarkable, lower endoscopy concerning for cecal mass.  Patient has been seen in consultation by general surgery and patient for possible exploratory laparotomy tomorrow per general surgery for palliation. - GI c/s, appreciate recs - gyn onc c/s  - antiemetics    Hypokalemia  Hypophosphatemia: Check a magnesium level.  Try to keep magnesium greater than 2 and potassium greater than 4.   Anion gap metabolic acidosis:improved  L ureteral obstruction s/p stent placement: Follow creatinine  Abnormal UA: urine cx negatve.  No antibiotics needed at this time.  Leukocytosis: improved  Mildly elevated liver enzymes: improved  Mildly elevated lipase: imaging no signs of pancreatitis.  Patient clinically with no signs of pancreatitis.  Monitor for now.    History of seizures:has had 1 seizure in 1980's, none since.  No seizures noted during the hospitalization.  Continue IV Dilantin while patient is n.p.o.        DVT prophylaxis: Lovenox Code Status: Full Family Communication: Updated patient.  No family at bedside. Disposition Plan: Pending hospitalization.  May need skilled nursing facility.   Consultants:   Otology/oncology Dr. Benay Spice [phone]  Gastroenterology Dr. Oletta Lamas 09/15/2017  General surgery Dr. Harlow Asa 09/15/2017  GYN oncology  Procedures:   Endoscopy per Dr. Cristina Gong 09/22/2016  Colonoscopy per Dr. Cristina Gong 09/22/2017  CT abdomen and pelvis 09/14/2017  CT-guided core biopsy of peritoneal tumor per Dr. Kathlene Cote 09/17/2017  Abdominal x-ray 09/15/2017, 10/01/2017  Acute abdominal series 09/14/2017  Antimicrobials:   None   Subjective: Patient just returned from endoscopy and colonoscopy.  Patient still with some abdominal discomfort.  No chest pain.  No shortness of breath.  Objective: Vitals:   09/22/17 1430 09/22/17 1435 09/22/17 1440 09/22/17 1508  BP: 121/86  123/78 121/79  Pulse: 95 95 100 98  Resp: (!) 27 (!) 23 (!) 24 20  Temp:    98.4 F (36.9 C)  TempSrc:    Oral  SpO2: 97% 97% 97% 97%  Weight:      Height:        Intake/Output Summary (Last 24 hours) at 09/22/2017 1543 Last data filed at 09/22/2017 1500 Gross per 24 hour  Intake 1553.67 ml  Output -  Net 1553.67 ml   Filed Weights    09/14/17 1127 09/14/17 2110 09/22/17 1217  Weight: 68 kg (150 lb) 63.4 kg (139 lb 12.8 oz) 63 kg (139 lb)    Examination:  General exam: Appears calm and comfortable.  NG-tube in place Respiratory system: Clear to auscultation. Respiratory effort normal. Cardiovascular system: S1 & S2 heard, RRR. No JVD, murmurs, rubs, gallops or clicks. No pedal edema. Gastrointestinal system: Abdomen is soft, tender to palpation in the mid abdominal region, no organomegaly, no masses felt.  Hypoactive bowel sounds.  No rebound.  No guarding. Central nervous system: Alert and oriented. No focal neurological deficits. Extremities: Symmetric 5 x 5 power. Skin: No rashes, lesions or ulcers Psychiatry: Judgement and insight appear normal. Mood & affect appropriate.     Data Reviewed: I have personally reviewed following labs and imaging studies  CBC: Recent Labs  Lab 09/17/17 0427 09/18/17 0403 09/19/17 0548 09/20/17 0421 09/21/17 0426 09/22/17 0334  WBC 7.7 8.1 7.9 8.9 9.3 9.5  NEUTROABS 5.6  --   --  6.8  --   --   HGB 12.9 13.3 13.3 13.0 12.3 12.2  HCT 39.4 42.1 41.8 40.8 39.1 38.1  MCV 88.5 89.4 89.3 89.3 89.3 88.6  PLT 401* 398 394 401* 274 952   Basic Metabolic Panel: Recent Labs  Lab 09/18/17 0403 09/19/17 0548 09/20/17 0421 09/21/17 0426 09/22/17 0334  NA 142 143 145 141 140  K 3.4* 4.3 2.8* 3.5 3.5  CL 109 110 114* 113* 111  CO2 24 25 24 24 24   GLUCOSE 101* 98 152* 139* 131*  BUN 16 14 15 14 16   CREATININE 0.73 0.70 0.65 0.56 0.58  CALCIUM 8.1* 8.1* 8.0* 7.8* 7.6*  MG 2.2 2.2 2.1 2.3 2.1  PHOS 2.6 2.3* 2.2* 2.4* 3.0   GFR: Estimated Creatinine Clearance: 62.8 mL/min (by C-G formula based on SCr of 0.58 mg/dL). Liver Function Tests: Recent Labs  Lab 09/17/17 0427 09/20/17 0421 09/21/17 0426  AST 20 24 30   ALT 25 21 26   ALKPHOS 100 79 77  BILITOT 1.1 0.9 0.8  PROT 5.9* 5.9* 5.6*  ALBUMIN 2.4* 2.4* 2.2*   No results for input(s): LIPASE, AMYLASE in the last 168  hours. No results for input(s): AMMONIA in the last 168 hours. Coagulation Profile: Recent Labs  Lab 09/17/17 0427  INR 1.51   Cardiac Enzymes: No results for input(s): CKTOTAL, CKMB, CKMBINDEX, TROPONINI in the last 168 hours. BNP (last 3 results) No results for input(s): PROBNP in the last 8760 hours. HbA1C: No results for input(s): HGBA1C in the last 72 hours. CBG: Recent Labs  Lab 09/21/17 1807 09/22/17 0018 09/22/17 0539 09/22/17 0756 09/22/17 1141  GLUCAP 116* 145* 111* 127* 139*   Lipid Profile: Recent Labs    09/20/17 0421  TRIG 180*   Thyroid Function Tests: No results for input(s): TSH, T4TOTAL, FREET4, T3FREE, THYROIDAB in the last 72 hours. Anemia Panel: No results for input(s): VITAMINB12, FOLATE, FERRITIN, TIBC, IRON, RETICCTPCT in the last 72 hours. Sepsis Labs: No results for input(s): PROCALCITON, LATICACIDVEN in the last 168 hours.  Recent Results (from the past 240 hour(s))  Culture, Urine     Status: None   Collection Time: 09/14/17  4:30 PM  Result Value Ref Range Status   Specimen Description URINE, RANDOM  Final   Special Requests NONE  Final   Culture   Final    NO GROWTH Performed at [facility], [address], [place], [place] [address]    Report Status 09/16/2017 FINAL  Final         Radiology Studies: Dg Abd 2 Views  Result Date: 09/21/2017 CLINICAL DATA:  Follow-up small bowel obstruction. Clinically improved. EXAM: ABDOMEN - 2 VIEW COMPARISON:  Abdominal radiograph of January 2nd 2019 and CT scan of the abdomen of September 17, 2017. FINDINGS: There remain loops of mildly distended gas-filled small bowel to the right of midline and in the mid abdomen. There is a gas within the transverse colon and a small amount of gas within the rectum. There is gas within a hiatal hernia. The esophagogastric tube tip and proximal port project below the hemidiaphragms. No free extraluminal gas collections are observed. A double pigtail stent  is present in the left kidney and ureter extending into the bladder. IMPRESSION: Persistent partial distal small bowel obstruction. No evidence of perforation. Hiatal hernia.  The esophagogastric tube is in reasonable position. Electronically Signed   By: David  Martinique M.D.   On: 09/21/2017 13:12        Scheduled Meds: . enoxaparin (LOVENOX) injection  40 mg Subcutaneous Q24H  . feeding supplement  1 Container Oral BID BM  . insulin aspart  0-9 Units Subcutaneous Q6H  . phenytoin (DILANTIN) IV  100 mg Intravenous QHS   Continuous Infusions: . Marland KitchenTPN (CLINIMIX-E) Adult 60 mL/hr at 09/21/17 1820  . Marland KitchenTPN (CLINIMIX-E) Adult     And  . fat emulsion    . sodium chloride       LOS: 7 days    Time spent: 40 minutes    Irine Seal, MD [facility] Pager [phone]  If 7PM-7AM, please contact night-coverage [email or web address] Password [facility] 09/22/2017, 3:43 PM

## 2017-09-22 NOTE — H&P (View-Only) (Signed)
Day of Surgery   Subjective/Chief Complaint: Complains of some soreness of the abd. Endo showed mass in cecum   Objective: Vital signs in last 24 hours: Temp:  [98.1 F (36.7 C)-99.1 F (37.3 C)] 98.4 F (36.9 C) (01/09 1508) Pulse Rate:  [84-116] 98 (01/09 1508) Resp:  [19-28] 20 (01/09 1508) BP: (121-131)/(73-91) 121/79 (01/09 1508) SpO2:  [97 %-100 %] 97 % (01/09 1508) Weight:  [63 kg (139 lb)] 63 kg (139 lb) (01/09 1217) Last BM Date: 09/22/17  Intake/Output from previous day: 01/08 0701 - 01/09 0700 In: 2126.3 [P.O.:237; I.V.:1634.3; IV Piggyback:255] Out: 301 [Urine:300; Stool:1] Intake/Output this shift: Total I/O In: 500 [I.V.:500] Out: -   General appearance: alert and cooperative Resp: clear to auscultation bilaterally Cardio: regular rate and rhythm GI: soft, distended  Lab Results:  Recent Labs    09/21/17 0426 09/22/17 0334  WBC 9.3 9.5  HGB 12.3 12.2  HCT 39.1 38.1  PLT 274 238   BMET Recent Labs    09/21/17 0426 09/22/17 0334  NA 141 140  K 3.5 3.5  CL 113* 111  CO2 24 24  GLUCOSE 139* 131*  BUN 14 16  CREATININE 0.56 0.58  CALCIUM 7.8* 7.6*   PT/INR No results for input(s): LABPROT, INR in the last 72 hours. ABG No results for input(s): PHART, HCO3 in the last 72 hours.  Invalid input(s): PCO2, PO2  Studies/Results: Dg Abd 2 Views  Result Date: 09/21/2017 CLINICAL DATA:  Follow-up small bowel obstruction. Clinically improved. EXAM: ABDOMEN - 2 VIEW COMPARISON:  Abdominal radiograph of January 2nd 2019 and CT scan of the abdomen of September 17, 2017. FINDINGS: There remain loops of mildly distended gas-filled small bowel to the right of midline and in the mid abdomen. There is a gas within the transverse colon and a small amount of gas within the rectum. There is gas within a hiatal hernia. The esophagogastric tube tip and proximal port project below the hemidiaphragms. No free extraluminal gas collections are observed. A double pigtail  stent is present in the left kidney and ureter extending into the bladder. IMPRESSION: Persistent partial distal small bowel obstruction. No evidence of perforation. Hiatal hernia.  The esophagogastric tube is in reasonable position. Electronically Signed   By: David  Martinique M.D.   On: 09/21/2017 13:12    Anti-infectives: Anti-infectives (From admission, onward)   None      Assessment/Plan: s/p Procedure(s) with comments: ESOPHAGOGASTRODUODENOSCOPY (EGD) WITH PROPOFOL (N/A) - Leave NG in place, please use PEDIATRIC UPPER endoscope COLONOSCOPY WITH PROPOFOL (N/A) - The patient will have a tap water enema prep because she is recovering from bowel obstruction continue ng and bowel rest but may have ice chips  Plan for exploration tomorrow. Risks and benefits of the surgery discussed with patient and the family and they understand and wish to proceed  LOS: 7 days    TOTH III,Laterrian Hevener S 09/22/2017

## 2017-09-22 NOTE — Interval H&P Note (Signed)
History and Physical Interval Note:  09/22/2017 12:21 PM  Christie Maynard  has presented today for surgery, with the diagnosis of intra-abdominal malignancy  The various methods of treatment have been discussed with the patient and family. After consideration of risks, benefits and other options for treatment, the patient has consented to  Procedure(s) with comments: ESOPHAGOGASTRODUODENOSCOPY (EGD) WITH PROPOFOL (N/A) - Leave NG in place, please use PEDIATRIC UPPER endoscope COLONOSCOPY WITH PROPOFOL (N/A) - The patient will have a tap water enema prep because she is recovering from bowel obstruction as a surgical intervention .  The patient's history has been reviewed, patient examined, no change in status, stable for surgery.  I have reviewed the patient's chart and labs.  Questions were answered to the patient's satisfaction.     Cleotis Nipper

## 2017-09-22 NOTE — Anesthesia Preprocedure Evaluation (Addendum)
Anesthesia Evaluation  Patient identified by MRN, date of birth, ID band Patient awake    Reviewed: Allergy & Precautions, NPO status , Patient's Chart, lab work & pertinent test results  Airway Mallampati: II  TM Distance: >3 FB Neck ROM: Full    Dental  (+) Dental Advisory Given   Pulmonary neg pulmonary ROS,    breath sounds clear to auscultation       Cardiovascular negative cardio ROS   Rhythm:Regular Rate:Normal     Neuro/Psych Seizures -, Well Controlled,     GI/Hepatic Neg liver ROS, Abdominal CA   Endo/Other  negative endocrine ROS  Renal/GU negative Renal ROS     Musculoskeletal   Abdominal   Peds  Hematology negative hematology ROS (+)   Anesthesia Other Findings   Reproductive/Obstetrics                            Lab Results  Component Value Date   WBC 9.5 09/22/2017   HGB 12.2 09/22/2017   HCT 38.1 09/22/2017   MCV 88.6 09/22/2017   PLT 238 09/22/2017   Lab Results  Component Value Date   CREATININE 0.58 09/22/2017   BUN 16 09/22/2017   NA 140 09/22/2017   K 3.5 09/22/2017   CL 111 09/22/2017   CO2 24 09/22/2017    Anesthesia Physical Anesthesia Plan  ASA: III  Anesthesia Plan: General   Post-op Pain Management:    Induction: Intravenous and Rapid sequence  PONV Risk Score and Plan: 3 and Ondansetron, Dexamethasone and Treatment may vary due to age or medical condition  Airway Management Planned: Oral ETT  Additional Equipment:   Intra-op Plan:   Post-operative Plan: Extubation in OR  Informed Consent: I have reviewed the patients History and Physical, chart, labs and discussed the procedure including the risks, benefits and alternatives for the proposed anesthesia with the patient or authorized representative who has indicated his/her understanding and acceptance.   Dental advisory given  Plan Discussed with: CRNA  Anesthesia Plan Comments:         Anesthesia Quick Evaluation

## 2017-09-23 ENCOUNTER — Inpatient Hospital Stay (HOSPITAL_COMMUNITY): Payer: BLUE CROSS/BLUE SHIELD | Admitting: Anesthesiology

## 2017-09-23 ENCOUNTER — Encounter (HOSPITAL_COMMUNITY): Payer: Self-pay | Admitting: Gastroenterology

## 2017-09-23 ENCOUNTER — Encounter (HOSPITAL_COMMUNITY)
Admission: EM | Disposition: A | Discharge status: Home / Self Care | Payer: Self-pay | Source: Home / Self Care | Attending: Internal Medicine

## 2017-09-23 HISTORY — PX: LAPAROTOMY: SHX154

## 2017-09-23 LAB — COMPREHENSIVE METABOLIC PANEL
ALBUMIN: 2.1 g/dL — AB (ref 3.5–5.0)
ALK PHOS: 116 U/L (ref 38–126)
ALT: 29 U/L (ref 14–54)
ANION GAP: 6 (ref 5–15)
AST: 37 U/L (ref 15–41)
BUN: 19 mg/dL (ref 6–20)
CALCIUM: 8.1 mg/dL — AB (ref 8.9–10.3)
CO2: 25 mmol/L (ref 22–32)
Chloride: 108 mmol/L (ref 101–111)
Creatinine, Ser: 0.7 mg/dL (ref 0.44–1.00)
GFR calc Af Amer: >60 mL/min (ref >60)
GFR calc non Af Amer: >60 mL/min (ref >60)
GLUCOSE: 130 mg/dL — AB (ref 65–99)
Potassium: 4.2 mmol/L (ref 3.5–5.1)
SODIUM: 139 mmol/L (ref 135–145)
Total Bilirubin: 0.8 mg/dL (ref 0.3–1.2)
Total Protein: 5.9 g/dL — ABNORMAL LOW (ref 6.5–8.1)

## 2017-09-23 LAB — GLUCOSE, CAPILLARY
GLUCOSE-CAPILLARY: 143 mg/dL — AB (ref 65–99)
Glucose-Capillary: 136 mg/dL — ABNORMAL HIGH (ref 65–99)
Glucose-Capillary: 149 mg/dL — ABNORMAL HIGH (ref 65–99)
Glucose-Capillary: 165 mg/dL — ABNORMAL HIGH (ref 65–99)
Glucose-Capillary: 95 mg/dL (ref 65–99)

## 2017-09-23 LAB — MAGNESIUM: Magnesium: 2.3 mg/dL (ref 1.7–2.4)

## 2017-09-23 LAB — SURGICAL PCR SCREEN
MRSA, PCR: NEGATIVE
STAPHYLOCOCCUS AUREUS: NEGATIVE

## 2017-09-23 LAB — PHOSPHORUS: Phosphorus: 2.8 mg/dL (ref 2.5–4.6)

## 2017-09-23 SURGERY — LAPAROTOMY, EXPLORATORY
Anesthesia: General | Site: Abdomen

## 2017-09-23 MED ORDER — FENTANYL CITRATE (PF) 250 MCG/5ML IJ SOLN
INTRAMUSCULAR | Status: AC
  Filled 2017-09-23: qty 5

## 2017-09-23 MED ORDER — ONDANSETRON HCL 4 MG/2ML IJ SOLN
INTRAMUSCULAR | Status: DC | PRN
  Administered 2017-09-23: 4 mg via INTRAVENOUS

## 2017-09-23 MED ORDER — TRACE MINERALS CR-CU-MN-SE-ZN 10-1000-500-60 MCG/ML IV SOLN
INTRAVENOUS | Status: AC
  Administered 2017-09-23: 18:00:00 via INTRAVENOUS
  Filled 2017-09-23: qty 1800

## 2017-09-23 MED ORDER — ALBUMIN HUMAN 5 % IV SOLN
INTRAVENOUS | Status: DC | PRN
  Administered 2017-09-23 (×2): via INTRAVENOUS

## 2017-09-23 MED ORDER — DEXAMETHASONE SODIUM PHOSPHATE 10 MG/ML IJ SOLN
INTRAMUSCULAR | Status: AC
  Filled 2017-09-23: qty 1

## 2017-09-23 MED ORDER — ONDANSETRON HCL 4 MG/2ML IJ SOLN: 4.0000 mg | Freq: Four times a day (QID) | INTRAMUSCULAR | Status: DC | PRN

## 2017-09-23 MED ORDER — 0.9 % SODIUM CHLORIDE (POUR BTL) OPTIME
TOPICAL | Status: DC | PRN
  Administered 2017-09-23: 2000 mL

## 2017-09-23 MED ORDER — LACTATED RINGERS IV SOLN
INTRAVENOUS | Status: DC | PRN
  Administered 2017-09-23 (×2): via INTRAVENOUS

## 2017-09-23 MED ORDER — LACTATED RINGERS IV SOLN
INTRAVENOUS | Status: DC
  Administered 2017-09-23: 13:00:00 via INTRAVENOUS

## 2017-09-23 MED ORDER — PROPOFOL 10 MG/ML IV BOLUS
INTRAVENOUS | Status: AC
  Filled 2017-09-23: qty 20

## 2017-09-23 MED ORDER — HYDROMORPHONE HCL 1 MG/ML IJ SOLN
INTRAMUSCULAR | Status: AC
  Administered 2017-09-23: 0.5 mg via INTRAVENOUS
  Filled 2017-09-23: qty 1

## 2017-09-23 MED ORDER — FENTANYL CITRATE (PF) 100 MCG/2ML IJ SOLN
INTRAMUSCULAR | Status: DC | PRN
  Administered 2017-09-23: 100 ug via INTRAVENOUS
  Administered 2017-09-23 (×3): 50 ug via INTRAVENOUS
  Administered 2017-09-23: 100 ug via INTRAVENOUS
  Administered 2017-09-23 (×3): 50 ug via INTRAVENOUS

## 2017-09-23 MED ORDER — NALOXONE HCL 0.4 MG/ML IJ SOLN: 0.4000 mg | INTRAMUSCULAR | Status: DC | PRN

## 2017-09-23 MED ORDER — LIDOCAINE HCL (CARDIAC) 20 MG/ML IV SOLN
INTRAVENOUS | Status: DC | PRN
  Administered 2017-09-23: 25 mg via INTRATRACHEAL

## 2017-09-23 MED ORDER — SODIUM CHLORIDE 0.9% FLUSH: 9.0000 mL | INTRAVENOUS | Status: DC | PRN

## 2017-09-23 MED ORDER — FAT EMULSION 20 % IV EMUL
240.0000 mL | INTRAVENOUS | Status: AC
  Administered 2017-09-23: 240 mL via INTRAVENOUS
  Filled 2017-09-23: qty 240

## 2017-09-23 MED ORDER — LACTATED RINGERS IV SOLN
INTRAVENOUS | Status: DC | PRN
  Administered 2017-09-23 (×2): via INTRAVENOUS

## 2017-09-23 MED ORDER — DEXAMETHASONE SODIUM PHOSPHATE 10 MG/ML IJ SOLN
INTRAMUSCULAR | Status: DC | PRN
  Administered 2017-09-23: 6 mg via INTRAVENOUS

## 2017-09-23 MED ORDER — ROCURONIUM BROMIDE 100 MG/10ML IV SOLN
INTRAVENOUS | Status: DC | PRN
  Administered 2017-09-23: 50 mg via INTRAVENOUS

## 2017-09-23 MED ORDER — HYDROMORPHONE HCL 1 MG/ML IJ SOLN
INTRAMUSCULAR | Status: DC | PRN
  Administered 2017-09-23 (×2): 1 mg via INTRAVENOUS

## 2017-09-23 MED ORDER — ONDANSETRON HCL 4 MG/2ML IJ SOLN
INTRAMUSCULAR | Status: AC
  Filled 2017-09-23: qty 2

## 2017-09-23 MED ORDER — ACETAMINOPHEN 10 MG/ML IV SOLN: INTRAVENOUS | Status: DC | PRN

## 2017-09-23 MED ORDER — HYDROMORPHONE HCL 2 MG/ML IJ SOLN
INTRAMUSCULAR | Status: AC
  Filled 2017-09-23: qty 1

## 2017-09-23 MED ORDER — SODIUM CHLORIDE 0.9 % IJ SOLN
INTRAMUSCULAR | Status: AC
  Filled 2017-09-23: qty 10

## 2017-09-23 MED ORDER — ALBUMIN HUMAN 5 % IV SOLN
INTRAVENOUS | Status: AC
  Filled 2017-09-23: qty 250

## 2017-09-23 MED ORDER — DIPHENHYDRAMINE HCL 12.5 MG/5ML PO ELIX: 12.5000 mg | ORAL_SOLUTION | Freq: Four times a day (QID) | ORAL | Status: DC | PRN

## 2017-09-23 MED ORDER — MIDAZOLAM HCL 2 MG/2ML IJ SOLN
INTRAMUSCULAR | Status: AC
  Filled 2017-09-23: qty 2

## 2017-09-23 MED ORDER — SUGAMMADEX SODIUM 200 MG/2ML IV SOLN
INTRAVENOUS | Status: DC | PRN
  Administered 2017-09-23: 150 mg via INTRAVENOUS

## 2017-09-23 MED ORDER — PROPOFOL 10 MG/ML IV BOLUS
INTRAVENOUS | Status: DC | PRN
  Administered 2017-09-23: 120 mg via INTRAVENOUS

## 2017-09-23 MED ORDER — PROMETHAZINE HCL 25 MG/ML IJ SOLN: 6.2500 mg | INTRAMUSCULAR | Status: DC | PRN

## 2017-09-23 MED ORDER — SUCCINYLCHOLINE CHLORIDE 20 MG/ML IJ SOLN
INTRAMUSCULAR | Status: DC | PRN
  Administered 2017-09-23: 120 mg via INTRAVENOUS

## 2017-09-23 MED ORDER — MORPHINE SULFATE 2 MG/ML IV SOLN
INTRAVENOUS | Status: DC
  Administered 2017-09-23: 19:00:00 via INTRAVENOUS
  Administered 2017-09-24 (×2): 9 mg via INTRAVENOUS
  Administered 2017-09-24: 4 mL via INTRAVENOUS
  Administered 2017-09-24: 1.5 mg via INTRAVENOUS
  Administered 2017-09-25: 12 mg via INTRAVENOUS
  Administered 2017-09-25: 1.5 mL via INTRAVENOUS
  Administered 2017-09-25: 4.5 mg via INTRAVENOUS
  Administered 2017-09-25: 0 mL via INTRAVENOUS
  Administered 2017-09-26: 1.5 mg via INTRAVENOUS
  Administered 2017-09-26: 4.5 mg via INTRAVENOUS
  Administered 2017-09-26 (×2): 1.5 mg via INTRAVENOUS
  Administered 2017-09-26 (×2): 0 mg via INTRAVENOUS
  Administered 2017-09-27 (×2): 6 mg via INTRAVENOUS
  Administered 2017-09-27: 9 mg via INTRAVENOUS
  Administered 2017-09-27: 6 mg via INTRAVENOUS
  Administered 2017-09-28: 20:00:00 via INTRAVENOUS
  Administered 2017-09-28 (×2): 4.5 mg via INTRAVENOUS
  Administered 2017-09-28: 1.5 mg via INTRAVENOUS
  Administered 2017-09-28 (×2): 6 mg via INTRAVENOUS
  Administered 2017-09-29 (×5): 0 mg via INTRAVENOUS
  Administered 2017-09-29: 1.5 mg via INTRAVENOUS
  Administered 2017-09-30 (×3): 0 mg via INTRAVENOUS
  Filled 2017-09-23 (×4): qty 30

## 2017-09-23 MED ORDER — CEFOTETAN DISODIUM-DEXTROSE 2-2.08 GM-%(50ML) IV SOLR
INTRAVENOUS | Status: AC
  Filled 2017-09-23: qty 50

## 2017-09-23 MED ORDER — DIPHENHYDRAMINE HCL 50 MG/ML IJ SOLN
12.5000 mg | Freq: Four times a day (QID) | INTRAMUSCULAR | Status: DC | PRN
  Administered 2017-09-26: 12.5 mg via INTRAVENOUS
  Filled 2017-09-23: qty 1

## 2017-09-23 MED ORDER — HYDROMORPHONE HCL 1 MG/ML IJ SOLN
0.2500 mg | INTRAMUSCULAR | Status: DC | PRN
  Administered 2017-09-23 (×4): 0.5 mg via INTRAVENOUS

## 2017-09-23 MED ORDER — SUGAMMADEX SODIUM 200 MG/2ML IV SOLN
INTRAVENOUS | Status: AC
  Filled 2017-09-23: qty 2

## 2017-09-23 SURGICAL SUPPLY — 40 items
APPLIER CLIP 5 13 M/L LIGAMAX5 (MISCELLANEOUS)
BLADE EXTENDED COATED 6.5IN (ELECTRODE) ×4 IMPLANT
CATH MALLECOT 28FR (CATHETERS) ×4 IMPLANT
CLIP APPLIE 5 13 M/L LIGAMAX5 (MISCELLANEOUS) IMPLANT
DERMABOND ADVANCED (GAUZE/BANDAGES/DRESSINGS)
DERMABOND ADVANCED .7 DNX12 (GAUZE/BANDAGES/DRESSINGS) IMPLANT
DRSG OPSITE POSTOP 4X10 (GAUZE/BANDAGES/DRESSINGS) IMPLANT
DRSG OPSITE POSTOP 4X12 (GAUZE/BANDAGES/DRESSINGS) ×4 IMPLANT
DRSG OPSITE POSTOP 4X6 (GAUZE/BANDAGES/DRESSINGS) IMPLANT
DRSG OPSITE POSTOP 4X8 (GAUZE/BANDAGES/DRESSINGS) IMPLANT
ELECT PENCIL ROCKER SW 15FT (MISCELLANEOUS) ×4 IMPLANT
ELECT REM PT RETURN 15FT ADLT (MISCELLANEOUS) ×4 IMPLANT
GAUZE SPONGE 4X4 12PLY STRL (GAUZE/BANDAGES/DRESSINGS) IMPLANT
GLOVE BIO SURGEON STRL SZ7.5 (GLOVE) ×8 IMPLANT
GLOVE BIOGEL PI IND STRL 7.0 (GLOVE) ×2 IMPLANT
GLOVE BIOGEL PI INDICATOR 7.0 (GLOVE) ×2
GOWN STRL REUS W/ TWL XL LVL3 (GOWN DISPOSABLE) ×2 IMPLANT
GOWN STRL REUS W/TWL LRG LVL3 (GOWN DISPOSABLE) ×4 IMPLANT
GOWN STRL REUS W/TWL XL LVL3 (GOWN DISPOSABLE) ×18 IMPLANT
HANDLE STAPLE EGIA 4 XL (STAPLE) ×4 IMPLANT
HOLDER FOLEY CATH W/STRAP (MISCELLANEOUS) ×4 IMPLANT
LIGASURE IMPACT 36 18CM CVD LR (INSTRUMENTS) ×4 IMPLANT
PACK COLON (CUSTOM PROCEDURE TRAY) ×4 IMPLANT
RELOAD EGIA 60 MED/THCK PURPLE (STAPLE) ×4 IMPLANT
SPONGE DRAIN TRACH 4X4 STRL 2S (GAUZE/BANDAGES/DRESSINGS) ×4 IMPLANT
STAPLER VISISTAT 35W (STAPLE) ×4 IMPLANT
SUT ETHILON 2 0 PSLX (SUTURE) ×4 IMPLANT
SUT PDS AB 1 CTX 36 (SUTURE) IMPLANT
SUT PDS AB 1 TP1 96 (SUTURE) ×8 IMPLANT
SUT PROLENE 2 0 SH DA (SUTURE) ×4 IMPLANT
SUT SILK 2 0 (SUTURE) ×2
SUT SILK 2 0 SH CR/8 (SUTURE) ×16 IMPLANT
SUT SILK 2-0 18XBRD TIE 12 (SUTURE) ×2 IMPLANT
SUT SILK 3 0 (SUTURE) ×2
SUT SILK 3 0 SH CR/8 (SUTURE) ×4 IMPLANT
SUT SILK 3-0 18XBRD TIE 12 (SUTURE) ×2 IMPLANT
TOWEL OR NON WOVEN STRL DISP B (DISPOSABLE) ×4 IMPLANT
TRAY FOLEY CATH 14FRSI W/METER (CATHETERS) ×4 IMPLANT
TUBING CONNECTING 10 (TUBING) ×6 IMPLANT
TUBING CONNECTING 10' (TUBING) ×2

## 2017-09-23 NOTE — Progress Notes (Signed)
PHARMACY - ADULT TOTAL PARENTERAL NUTRITION CONSULT NOTE   Pharmacy Consult for TPN Indication: bowel obstruction  Patient Measurements: Height: 5\' 2"  (157.5 cm) Weight: 139 lb (63 kg) IBW/kg (Calculated) : 50.1 TPN AdjBW (KG): 53.3 Body mass index is 25.42 kg/m. Usual Weight: 68 kg  Insulin Requirements: 3 units sensitive SSI / 24 hrs  Current Nutrition: NPO surgery 1/10 Boost/Breeze BID  IVF: NS at 10 ml/hr  Central access: PICC placed 1/6 TPN start date: 1/6  ASSESSMENT                                                                                                          HPI: Pt with PMH of seizures and recently diagnosed peritoneal carcinomatosis with bowel obstruction/colonic obstruction secondary to malignancy of unknown primary presents with N/V, admitted for further management/evaluation. CT abdomen/pelviswith contrast shows progressing SBO. Having BMs but with 1450 ml from NGT, Surgery recommends starting TPN. Patient with significant recent weight loss but primarily d/t multiple large-volume paracentesis. Poor intake since week before Xmas, and unable to keep anything down since 12/31. Appears to be at risk for refeeding.  Significant events:  1/9 endoscopy/colonoscopy showed mass in cecum 1/10 CCS planning for surgical exploration  Today:   Glucose - within goal range (< 150) but near 150 on minimal SSI.  Range 95-153.  Electrolytes - Na, K, Mag, Phos, CorrCa WNL.      Renal - SCr/BUN stable WNL  LFTs - slightly elevated on admit, now WNL (1/10)  TGs - slightly elevated at 180 (1/7)  Prealbumin - 8.4 (1.7)  NUTRITIONAL GOALS                                                                                             RD recs (09/20/17):  Kcal:  1700-1900 Protein:  80-90 grams Fluid:  >/= 1.7 L/d  Clinimix 5/15 at a goal rate of 75 ml/hr + 20% fat emulsion 240 ml to provide: 90 g/day protein, 1758 Kcal/day.  PLAN  Now:    At 1800 today:  Continue Clinimix E 5/15 at goal rate of 75 ml/hr  20% fat emulsion at 20 ml/hr x 12 hr  TPN to contain standard multivitamins and trace elements  IVF at [place] sensitive SSI with q6h CBG checks  TPN lab panels on Mondays & Thursdays  F/u daily  Gretta Arab PharmD, BCPS Pager [phone] 09/23/2017 7:10 AM

## 2017-09-23 NOTE — Progress Notes (Signed)
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 Patient reports poor oral intake and unable to eat solid foods for past few weeks but 24 hours prior to this admission, she felt a lot worse, bloated, uncomfortable. Off note patient had appointment with gastroenterologist scheduled for 09/15/2017 due to cytology results concerning for adenocarcinoma.    Assessment & Plan:   Principal Problem:   SBO (small bowel obstruction) (HCC) Active Problems:   Colonic mass   Ascites   Nausea & vomiting   Peritoneal carcinomatosis (HCC)   History of seizures   Nausea and vomiting   Dehydration   Neoplasm of colon, malignant (HCC)   Peritoneal Carcinomatosis  Ascites  Nausea and vomiting  Small Bowel Obstruction/cecal mass:  Pt was following with Dr. 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  - repeat x ray 1/8 with persistent partial SBO - surgery c/s - TPN for nutrition support.  Patient underwent upper endoscopy 09/22/2017, which was unremarkable, colonoscopy was concerning for cecal mass.  Patient has been seen in consultation by general surgery and patient for exploratory laparotomy today, 09/23/2017 per general surgery for  palliation. - GI c/s, appreciate recs - gyn onc c/s  - antiemetics  -Supportive care.  Hypokalemia  Hypophosphatemia: Medium level at 2.3.  Potassium now at 4.2. Try to keep magnesium greater than 2 and potassium greater than 4.   Anion gap metabolic acidosis:improved  L ureteral obstruction s/p stent placement: Creatinine stable.  Outpatient follow-up.    Abnormal UA: urine cx negatve.  No antibiotics needed at this time.  Leukocytosis: improved  Mildly elevated liver enzymes: improved  Mildly elevated lipase: imaging no signs of pancreatitis.  Patient clinically with no signs of pancreatitis.  Monitor for now.    History of seizures:has had 1 seizure in 1980's, none since.  No seizures overnight.  Continue IV Dilantin while n.p.o.         DVT prophylaxis: Lovenox Code Status: Full Family Communication: Updated patien and husband at bedside.  Disposition Plan: Pending hospitalization.  May need skilled nursing facility.  Per general surgery.   Consultants:   Otology/oncology Dr. Benay Spice [phone]  Gastroenterology Dr. Oletta Lamas 09/15/2017  General surgery Dr. Harlow Asa 09/15/2017  GYN oncology  Procedures:   Endoscopy per Dr. Cristina Gong 09/22/2016  Colonoscopy per Dr. Cristina Gong 09/22/2017  CT abdomen and pelvis 09/14/2017  CT-guided core biopsy of peritoneal tumor per Dr. Kathlene Cote 09/17/2017  Abdominal x-ray 09/15/2017, 10/01/2017  Acute abdominal series 09/14/2017  Antimicrobials:   None   Subjective: Patient in bed.  Denies any shortness of breath or chest pain.  Still with some diffuse abdominal distention.  Passing flatus.  Patient states had a squirt in terms of a bowel movement.   Objective: Vitals:   09/22/17 1440 09/22/17 1508 09/22/17 2144 09/23/17 0434  BP:  123/78 121/79 126/87 109/63  Pulse: 100 98 96 99  Resp: (!) 24 20 (!) 24 20  Temp:  98.4 F (36.9 C) 98.8 F (37.1 C) 98.6 F (37 C)  TempSrc:  Oral Oral Oral  SpO2: 97% 97% 98% 97%   Weight:      Height:        Intake/Output Summary (Last 24 hours) at 09/23/2017 0943 Last data filed at 09/23/2017 0831 Gross per 24 hour  Intake 2602.92 ml  Output 700 ml  Net 1902.92 ml   Filed Weights   09/14/17 1127 09/14/17 2110 09/22/17 1217  Weight: 68 kg (150 lb) 63.4 kg (139 lb 12.8 oz) 63 kg (139 lb)    Examination:  General exam: Appears calm and comfortable.  NG-tube in place Respiratory system: Clear to auscultation bilaterally.  No wheezes, no rhonchi.  Fair air movement.  Respiratory effort normal. Cardiovascular system: S1 & S2 heard, RRR. No JVD, murmurs, rubs, gallops or clicks. No pedal edema. Gastrointestinal system: Abdomen is soft, mildly distended, tender to palpation in the mid abdominal region, no organomegaly, no masses felt.  Hypoactive bowel sounds.  No rebound.  No guarding. Central nervous system: Alert and oriented. No focal neurological deficits. Extremities: Symmetric 5 x 5 power. Skin: No rashes, lesions or ulcers Psychiatry: Judgement and insight appear normal. Mood & affect appropriate.     Data Reviewed: I have personally reviewed following labs and imaging studies  CBC: Recent Labs  Lab 09/17/17 0427 09/18/17 0403 09/19/17 0548 09/20/17 0421 09/21/17 0426 09/22/17 0334  WBC 7.7 8.1 7.9 8.9 9.3 9.5  NEUTROABS 5.6  --   --  6.8  --   --   HGB 12.9 13.3 13.3 13.0 12.3 12.2  HCT 39.4 42.1 41.8 40.8 39.1 38.1  MCV 88.5 89.4 89.3 89.3 89.3 88.6  PLT 401* 398 394 401* 274 856   Basic Metabolic Panel: Recent Labs  Lab 09/19/17 0548 09/20/17 0421 09/21/17 0426 09/22/17 0334 09/23/17 0444  NA 143 145 141 140 139  K 4.3 2.8* 3.5 3.5 4.2  CL 110 114* 113* 111 108  CO2 25 24 24 24 25   GLUCOSE 98 152* 139* 131* 130*  BUN 14 15 14 16 19   CREATININE 0.70 0.65 0.56 0.58 0.70  CALCIUM 8.1* 8.0* 7.8* 7.6* 8.1*  MG 2.2 2.1 2.3 2.1 2.3  PHOS 2.3* 2.2* 2.4* 3.0 2.8   GFR: Estimated Creatinine Clearance: 62.8 mL/min (by C-G formula  based on SCr of 0.7 mg/dL). Liver Function Tests: Recent Labs  Lab 09/17/17 0427 09/20/17 0421 09/21/17 0426 09/23/17 0444  AST 20 24 30  37  ALT 25 21 26 29   ALKPHOS 100 79 77 116  BILITOT 1.1 0.9 0.8 0.8  PROT 5.9* 5.9* 5.6* 5.9*  ALBUMIN 2.4* 2.4* 2.2* 2.1*   No results for input(s): LIPASE, AMYLASE in the last 168 hours. No results for input(s): AMMONIA in the last 168 hours. Coagulation Profile: Recent Labs  Lab 09/17/17 0427  INR 1.51   Cardiac Enzymes: No results for input(s): CKTOTAL, CKMB, CKMBINDEX, TROPONINI in the last 168 hours. BNP (last 3 results) No results for input(s): PROBNP in the last 8760 hours. HbA1C: No results for input(s): HGBA1C in the last 72 hours. CBG: Recent Labs  Lab 09/22/17 0756 09/22/17 1141 09/22/17 1723 09/23/17 0002 09/23/17 0618  GLUCAP 127* 139* 153* 144* 95   Lipid Profile: No results for input(s): CHOL, HDL, LDLCALC, TRIG, CHOLHDL, LDLDIRECT in the last 72 hours. Thyroid Function Tests: No  results for input(s): TSH, T4TOTAL, FREET4, T3FREE, THYROIDAB in the last 72 hours. Anemia Panel: No results for input(s): VITAMINB12, FOLATE, FERRITIN, TIBC, IRON, RETICCTPCT in the last 72 hours. Sepsis Labs: No results for input(s): PROCALCITON, LATICACIDVEN in the last 168 hours.  Recent Results (from the past 240 hour(s))  Culture, Urine     Status: None   Collection Time: 09/14/17  4:30 PM  Result Value Ref Range Status   Specimen Description URINE, RANDOM  Final   Special Requests NONE  Final   Culture   Final    NO GROWTH Performed at [facility], [address] [place], [place] [address]    Report Status 09/16/2017 FINAL  Final         Radiology Studies: Dg Abd 2 Views  Result Date: 09/21/2017 CLINICAL DATA:  Follow-up small bowel obstruction. Clinically improved. EXAM: ABDOMEN - 2 VIEW COMPARISON:  Abdominal radiograph of January 2nd 2019 and CT scan of the abdomen of September 17, 2017. FINDINGS: There remain  loops of mildly distended gas-filled small bowel to the right of midline and in the mid abdomen. There is a gas within the transverse colon and a small amount of gas within the rectum. There is gas within a hiatal hernia. The esophagogastric tube tip and proximal port project below the hemidiaphragms. No free extraluminal gas collections are observed. A double pigtail stent is present in the left kidney and ureter extending into the bladder. IMPRESSION: Persistent partial distal small bowel obstruction. No evidence of perforation. Hiatal hernia.  The esophagogastric tube is in reasonable position. Electronically Signed   By: David  Martinique M.D.   On: 09/21/2017 13:12        Scheduled Meds: . enoxaparin (LOVENOX) injection  40 mg Subcutaneous Q24H  . feeding supplement  1 Container Oral BID BM  . insulin aspart  0-9 Units Subcutaneous Q6H  . phenytoin (DILANTIN) IV  100 mg Intravenous QHS   Continuous Infusions: . Marland KitchenTPN (CLINIMIX-E) Adult 75 mL/hr at 09/22/17 1751  . sodium chloride 10 mL/hr at 09/22/17 1756  . cefoTEtan (CEFOTAN) IV       LOS: 8 days    Time spent: 35 minutes    Irine Seal, MD [facility] Pager [phone]  If 7PM-7AM, please contact night-coverage [email or web address] Password [facility] 09/23/2017, 9:43 AM

## 2017-09-23 NOTE — Transfer of Care (Signed)
Immediate Anesthesia Transfer of Care Note  Patient: Christie Maynard  Procedure(s) Performed: EXPLORATORY LAPAROTOMY WITH PALLIATIVE SMALL BOWEL TO RIGHT COLON BY PASS WITH PLACEMENT OF GASTROSTOMY TUBE (N/A Abdomen)  Patient Location: PACU  Anesthesia Type:General  Level of Consciousness: awake, alert , oriented and patient cooperative  Airway & Oxygen Therapy: Patient Spontanous Breathing and Patient connected to face mask oxygen  Post-op Assessment: Report given to RN, Post -op Vital signs reviewed and stable and Patient moving all extremities X 4  Post vital signs: stable  Last Vitals:  Vitals:   09/22/17 2144 09/23/17 0434  BP: 126/87 109/63  Pulse: 96 99  Resp: (!) 24 20  Temp: 37.1 C 37 C  SpO2: 98% 97%    Last Pain:  Vitals:   09/23/17 0434  TempSrc: Oral  PainSc:       Patients Stated Pain Goal: 3 (55/[id number]/41 [id number])  Complications: No apparent anesthesia complications

## 2017-09-23 NOTE — Interval H&P Note (Signed)
History and Physical Interval Note:  09/23/2017 12:47 PM  Christie Maynard  has presented today for surgery, with the diagnosis of METASTATIC COLON CANCER  The various methods of treatment have been discussed with the patient and family. After consideration of risks, benefits and other options for treatment, the patient has consented to  Procedure(s): EXPLORATORY LAPAROTOMY  POSSIBLE SMALL BOWEL RESECTON POSSIBLE BY PASS POSSIBLE GASTROSTOMY TUBE (N/A), Possible ostomy, INSERTION OF GASTROSTOMY TUBE (N/A) as a surgical intervention .  The patient's history has been reviewed, patient examined, no change in status, stable for surgery.  I have reviewed the patient's chart and labs.  Questions were answered to the patient's satisfaction.     TOTH III,PAUL S

## 2017-09-23 NOTE — Op Note (Signed)
09/23/2017  3:57 PM  PATIENT:  Christie Maynard  62 y.o. female  PRE-OPERATIVE DIAGNOSIS:  METASTATIC COLON CANCER  POST-OPERATIVE DIAGNOSIS:  METASTATIC COLON CANCER  PROCEDURE:  Procedure(s): EXPLORATORY LAPAROTOMY WITH PALLIATIVE SMALL BOWEL TO RIGHT COLON BY PASS WITH PLACEMENT OF GASTROSTOMY TUBE (N/A)  SURGEON:  Surgeon(s) and Role:    * Jovita Kussmaul, MD - Primary    * Johnathan Hausen, MD - Assisting  PHYSICIAN ASSISTANT:   ASSISTANTS: Dr. Hassell Done   ANESTHESIA:   general  EBL:  100 mL   BLOOD ADMINISTERED:none  DRAINS: Gastrostomy Tube   LOCAL MEDICATIONS USED:  NONE  SPECIMEN:  No Specimen  DISPOSITION OF SPECIMEN:  N/A  COUNTS:  YES  TOURNIQUET:  * No tourniquets in log *  DICTATION: .Dragon Dictation   After informed consent was obtained the patient was brought to the operating room and placed in the supine position on the operating table.  After adequate induction of general anesthesia the patient's abdomen was prepped with ChloraPrep, allowed to dry, and draped in usual sterile manner.  An appropriate timeout was performed.  A midline incision was made with a 10 blade knife.  The incision was carried through the skin and subcutaneous tissue sharply with electrocautery until the linea alba was identified.  The linea alba was also incised with the electrocautery.  The preperitoneal space was probed bluntly with a hemostat until the peritoneum was opened and access was gained to the abdominal cavity.  At least 2-3 L of ascites fluid was evacuated.  The abdomen was inspected.  There was a large fixed tumor in the pelvis.  There was a large fixed mass in the cecum.  There were innumerable implants of tumor along the lining of the abdominal wall.  These implants also filled the omentum.  These implants were surrounding most of the colon.  There were many implants invading the small intestine.  The tumor was not resectable.  I was able to find a relatively clean segment of  small intestine above the area of the most significant narrowing.  I was also able to find a short segment of soft right colon and these 2 segments of bowel but relatively easily approximated each other.  We made a small opening on the antimesenteric side of each area of right colon and small bowel.  We then placed each limb of a Covidien stapler down the appropriate limb of colon or small bowel, clamped, and fired the stapler thereby creating a nice widely patent enteroenterostomy.  The common opening was closed with interrupted 2-0 silk stitches.  The colon was not mobile enough to bring out an ostomy at any location.  The anterior surface of the stomach was relatively free.  We were able to place a 2-0 silk pursestring stitch along the low to mid portion of the anterior wall of the stomach.  We made a small opening in the center of the pursestring stitch with the electrocautery.  I then placed a 23 French Malecot tube through this opening into the stomach.  The pursestring stitch was then cinched down and tied.  The end of the tube was brought through a stab hole in the left upper quadrant of the abdominal wall.  The stomach was tacked to the abdominal wall at the tube exit site with multiple interrupted 2-0 silk stitches.  The gastrostomy tube was then anchored to the skin with a 2-0 nylon stitch.  At this point there was nothing more that we could  do for palliation.  The fascia of the anterior abdominal wall was closed with 2 running #1 double-stranded looped PDS sutures.  The subcutaneous tissue was irrigated with copious amounts of saline.  The skin was closed with staples.  The gastrostomy tube was placed to Foley bag drainage.  Sterile dressings were applied.  The patient tolerated the procedure well.  At the end of the case all needle sponge and instrument counts were correct.  The patient was then awakened and taken to recovery in stable condition.  PLAN OF CARE: Admit to inpatient   PATIENT  DISPOSITION:  PACU - hemodynamically stable.   Delay start of Pharmacological VTE agent (>24hrs) due to surgical blood loss or risk of bleeding: no

## 2017-09-23 NOTE — Anesthesia Procedure Notes (Addendum)
Procedure Name: Intubation Date/Time: 09/23/2017 1:28 PM Performed by: Lissa Morales, CRNA Pre-anesthesia Checklist: Patient identified, Emergency Drugs available, Suction available and Patient being monitored Patient Re-evaluated:Patient Re-evaluated prior to induction Oxygen Delivery Method: Circle system utilized Preoxygenation: Pre-oxygenation with 100% oxygen Induction Type: IV induction, Rapid sequence and Cricoid Pressure applied Laryngoscope Size: Mac and 3 Grade View: Grade II Tube type: Oral Tube size: 7.0 mm Number of attempts: 1 Airway Equipment and Method: Stylet Placement Confirmation: ETT inserted through vocal cords under direct vision,  positive ETCO2 and breath sounds checked- equal and bilateral Secured at: 21 cm Tube secured with: Tape Dental Injury: Teeth and Oropharynx as per pre-operative assessment

## 2017-09-23 NOTE — Anesthesia Preprocedure Evaluation (Addendum)
Anesthesia Evaluation  Patient identified by MRN, date of birth, ID band Patient awake    Reviewed: Allergy & Precautions, NPO status , Patient's Chart, lab work & pertinent test results  Airway Mallampati: II  TM Distance: >3 FB Neck ROM: Full    Dental  (+) Dental Advisory Given   Pulmonary neg pulmonary ROS,    breath sounds clear to auscultation       Cardiovascular negative cardio ROS   Rhythm:Regular Rate:Normal     Neuro/Psych Seizures -, Well Controlled,     GI/Hepatic Neg liver ROS, Metastatic colon CA   Endo/Other  negative endocrine ROS  Renal/GU negative Renal ROS     Musculoskeletal   Abdominal   Peds  Hematology negative hematology ROS (+)   Anesthesia Other Findings   Reproductive/Obstetrics                             Lab Results  Component Value Date   WBC 9.5 09/22/2017   HGB 12.2 09/22/2017   HCT 38.1 09/22/2017   MCV 88.6 09/22/2017   PLT 238 09/22/2017   Lab Results  Component Value Date   CREATININE 0.70 09/23/2017   BUN 19 09/23/2017   NA 139 09/23/2017   K 4.2 09/23/2017   CL 108 09/23/2017   CO2 25 09/23/2017    Anesthesia Physical  Anesthesia Plan  ASA: III  Anesthesia Plan: General   Post-op Pain Management:    Induction: Intravenous and Rapid sequence  PONV Risk Score and Plan: 3 and Ondansetron, Dexamethasone and Treatment may vary due to age or medical condition  Airway Management Planned: Oral ETT  Additional Equipment:   Intra-op Plan:   Post-operative Plan: Extubation in OR  Informed Consent: I have reviewed the patients History and Physical, chart, labs and discussed the procedure including the risks, benefits and alternatives for the proposed anesthesia with the patient or authorized representative who has indicated his/her understanding and acceptance.   Dental advisory given  Plan Discussed with: CRNA  Anesthesia Plan  Comments:         Anesthesia Quick Evaluation

## 2017-09-23 NOTE — Anesthesia Postprocedure Evaluation (Signed)
Anesthesia Post Note  Patient: Christie Maynard  Procedure(s) Performed: EXPLORATORY LAPAROTOMY WITH PALLIATIVE SMALL BOWEL TO RIGHT COLON BY PASS WITH PLACEMENT OF GASTROSTOMY TUBE (N/A Abdomen)     Patient location during evaluation: PACU Anesthesia Type: General Level of consciousness: awake and alert Pain management: pain level controlled Vital Signs Assessment: post-procedure vital signs reviewed and stable Respiratory status: spontaneous breathing, nonlabored ventilation, respiratory function stable and patient connected to nasal cannula oxygen Cardiovascular status: blood pressure returned to baseline and stable Postop Assessment: no apparent nausea or vomiting Anesthetic complications: no    Last Vitals:  Vitals:   09/23/17 1615 09/23/17 1626  BP: 121/80 117/80  Pulse: 96 (!) 101  Resp: 20 15  Temp: 36.9 C 36.8 C  SpO2: 96% 95%    Last Pain:  Vitals:   09/23/17 1615  TempSrc:   PainSc: Tyler Deis

## 2017-09-24 ENCOUNTER — Encounter (HOSPITAL_COMMUNITY): Payer: Self-pay | Admitting: General Surgery

## 2017-09-24 DIAGNOSIS — C180 Malignant neoplasm of cecum: Principal | ICD-10-CM

## 2017-09-24 LAB — BASIC METABOLIC PANEL
Anion gap: 3 — ABNORMAL LOW (ref 5–15)
BUN: 25 mg/dL — AB (ref 6–20)
CHLORIDE: 107 mmol/L (ref 101–111)
CO2: 26 mmol/L (ref 22–32)
CREATININE: 0.78 mg/dL (ref 0.44–1.00)
Calcium: 7.7 mg/dL — ABNORMAL LOW (ref 8.9–10.3)
GFR calc Af Amer: >60 mL/min (ref >60)
GFR calc non Af Amer: >60 mL/min (ref >60)
Glucose, Bld: 156 mg/dL — ABNORMAL HIGH (ref 65–99)
POTASSIUM: 4.7 mmol/L (ref 3.5–5.1)
Sodium: 136 mmol/L (ref 135–145)

## 2017-09-24 LAB — GLUCOSE, CAPILLARY
GLUCOSE-CAPILLARY: 163 mg/dL — AB (ref 65–99)
Glucose-Capillary: 152 mg/dL — ABNORMAL HIGH (ref 65–99)
Glucose-Capillary: 155 mg/dL — ABNORMAL HIGH (ref 65–99)
Glucose-Capillary: 169 mg/dL — ABNORMAL HIGH (ref 65–99)

## 2017-09-24 LAB — CBC WITH DIFFERENTIAL/PLATELET
Basophils Absolute: 0 10*3/uL (ref 0.0–0.1)
Basophils Relative: 0 %
Eosinophils Absolute: 0 10*3/uL (ref 0.0–0.7)
Eosinophils Relative: 0 %
HCT: 43 % (ref 36.0–46.0)
HEMOGLOBIN: 14 g/dL (ref 12.0–15.0)
LYMPHS ABS: 1.8 10*3/uL (ref 0.7–4.0)
LYMPHS PCT: 10 %
MCH: 28.7 pg (ref 26.0–34.0)
MCHC: 32.6 g/dL (ref 30.0–36.0)
MCV: 88.1 fL (ref 78.0–100.0)
MONOS PCT: 13 %
Monocytes Absolute: 2.4 10*3/uL — ABNORMAL HIGH (ref 0.1–1.0)
NEUTROS ABS: 14.5 10*3/uL — AB (ref 1.7–7.7)
NEUTROS PCT: 77 %
Platelets: 296 10*3/uL (ref 150–400)
RBC: 4.88 MIL/uL (ref 3.87–5.11)
RDW: 14.9 % (ref 11.5–15.5)
WBC: 18.7 10*3/uL — ABNORMAL HIGH (ref 4.0–10.5)

## 2017-09-24 MED ORDER — BOOST / RESOURCE BREEZE PO LIQD CUSTOM
1.0000 | Freq: Three times a day (TID) | ORAL | Status: DC
  Administered 2017-09-24 – 2017-10-07 (×25): 1 via ORAL

## 2017-09-24 MED ORDER — FAT EMULSION 20 % IV EMUL
240.0000 mL | INTRAVENOUS | Status: AC
  Administered 2017-09-24: 240 mL via INTRAVENOUS
  Filled 2017-09-24: qty 250

## 2017-09-24 MED ORDER — M.V.I. ADULT IV INJ
INJECTION | INTRAVENOUS | Status: AC
Start: 2017-09-24 — End: 2017-09-25
  Administered 2017-09-24: 19:00:00 via INTRAVENOUS
  Filled 2017-09-24: qty 1800

## 2017-09-24 NOTE — Progress Notes (Signed)
Patient up to chair.  Patient tolerated very well. Family at bedside; will continue to monitor.

## 2017-09-24 NOTE — Progress Notes (Signed)
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 Patient reports poor oral intake and unable to eat solid foods for past few weeks but 24 hours prior to this admission, she felt a lot worse, bloated, uncomfortable. Off note patient had appointment with gastroenterologist scheduled for 09/15/2017 due to cytology results concerning for adenocarcinoma.    Assessment & Plan:   Principal Problem:   SBO (small bowel obstruction) (HCC) Active Problems:   Colonic mass   Ascites   Nausea & vomiting   Peritoneal carcinomatosis (HCC)   History of seizures   Nausea and vomiting   Dehydration   Neoplasm of colon, malignant (HCC)   Peritoneal Carcinomatosis  Ascites  Nausea and vomiting  Small Bowel Obstruction/cecal mass:  Pt was following with Dr. 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  - repeat x ray 1/8 with persistent partial SBO - surgery c/s - TPN for nutrition support.  Patient underwent upper endoscopy 09/22/2017, which was unremarkable, colonoscopy was concerning for cecal mass.  Patient has been seen in consultation by general surgery and patient status post exploratory laparotomy with palliative small bowel to right colon bypass  with placement of gastrostomy tube per Dr. Marlou Starks 09/23/2017.  General surgery following.  Oncology following.  Continue antiemetics.  Supportive care.   Hypokalemia  Hypophosphatemia: Potassium has been repleted.  Follow.    Anion gap metabolic acidosis:improved  L ureteral obstruction s/p stent placement: Creatinine stable.  Outpatient follow-up.    Abnormal UA: urine cx negatve.  No antibiotics needed at this time.  Leukocytosis: improved  Mildly elevated liver enzymes: improved  Mildly elevated lipase: imaging no signs of pancreatitis.  Patient clinically with no signs of pancreatitis.  Monitor for now.    History of seizures:has had 1 seizure in 1980's, none since.  No seizures overnight.  Continue IV Dilantin while n.p.o.         DVT prophylaxis: Lovenox Code Status: Full Family Communication: Updated patient. No family at bedside.  Disposition Plan: Pending hospitalization.  May need skilled nursing facility.  Per general surgery.   Consultants:   Otology/oncology Dr. Benay Spice [phone]  Gastroenterology Dr. Oletta Lamas 09/15/2017  General surgery Dr. Harlow Asa 09/15/2017  GYN oncology  Procedures:   Endoscopy per Dr. Cristina Gong 09/22/2016  Colonoscopy per Dr. Cristina Gong 09/22/2017  CT abdomen and pelvis 09/14/2017  CT-guided core biopsy of peritoneal tumor per Dr. Kathlene Cote 09/17/2017  Abdominal x-ray 09/15/2017, 10/01/2017  Acute abdominal series 09/14/2017  Exploratory laparotomy with palliative small bowel to right colon bypass with placement of gastrostomy tube per Dr. Marlou Starks 09/23/2017  Antimicrobials:   None   Subjective: Patient in bed.  Patient postop from expiratory laparotomy yesterday.  No nausea or emesis.  Patient tolerating ice chips.  Patient with some complaints of abdominal discomfort.  No flatus.  No bowel movement.  Objective: Vitals:   09/23/17 2116 09/24/17 0100 09/24/17 0235 09/24/17 0609  BP: 112/82   104/75  Pulse: (!) 113   (!) 114  Resp:  20 (!) 21 18 20   Temp: 98.7 F (37.1 C)   98.7 F (37.1 C)  TempSrc: Oral   Oral  SpO2: 97% 97%  97%  Weight:      Height:        Intake/Output Summary (Last 24 hours) at 09/24/2017 1100 Last data filed at 09/24/2017 9323 Gross per 24 hour  Intake 4548.25 ml  Output 5575 ml  Net -1026.75 ml   Filed Weights   09/14/17 1127 09/14/17 2110 09/22/17 1217  Weight: 68 kg (150 lb) 63.4 kg (139 lb 12.8 oz) 63 kg (139 lb)    Examination:  General exam: NAD Respiratory system: Clear to auscultation bilaterally.  No wheezes, no rhonchi.  Fair air movement.  Respiratory effort normal. Cardiovascular system: Regular rate rhythm no murmurs rubs or gallops.  No JVD.  No pedal edema.  Gastrointestinal system: Abdomen is soft, mildly distended, some diffuse tenderness to palpation.  Hypoactive bowel sounds.  No guarding.  No rebound.  No hepatosplenomegaly.  G-tube intact. Central nervous system: Alert and oriented. No focal neurological deficits. Extremities: Symmetric 5 x 5 power. Skin: No rashes, lesions or ulcers Psychiatry: Judgement and insight appear normal. Mood & affect appropriate.     Data Reviewed: I have personally reviewed following labs and imaging studies  CBC: Recent Labs  Lab 09/19/17 0548 09/20/17 0421 09/21/17 0426 09/22/17 0334 09/24/17 0403  WBC 7.9 8.9 9.3 9.5 18.7*  NEUTROABS  --  6.8  --   --  14.5*  HGB 13.3 13.0 12.3 12.2 14.0  HCT 41.8 40.8 39.1 38.1 43.0  MCV 89.3 89.3 89.3 88.6 88.1  PLT 394 401* 274 238 557   Basic Metabolic Panel: Recent Labs  Lab 09/19/17 0548 09/20/17 0421 09/21/17 0426 09/22/17 0334 09/23/17 0444 09/24/17 0403  NA 143 145 141 140 139 136  K 4.3 2.8* 3.5 3.5 4.2 4.7  CL 110 114* 113* 111 108 107  CO2 25 24 24 24 25 26   GLUCOSE 98 152* 139* 131* 130* 156*  BUN 14 15 14 16 19  25*  CREATININE 0.70 0.65 0.56 0.58 0.70 0.78  CALCIUM 8.1* 8.0* 7.8* 7.6* 8.1* 7.7*  MG 2.2 2.1 2.3 2.1 2.3  --   PHOS 2.3* 2.2* 2.4* 3.0 2.8   --    GFR: Estimated Creatinine Clearance: 62.8 mL/min (by C-G formula based on SCr of 0.78 mg/dL). Liver Function Tests: Recent Labs  Lab 09/20/17 0421 09/21/17 0426 09/23/17 0444  AST 24 30 37  ALT 21 26 29   ALKPHOS 79 77 116  BILITOT 0.9 0.8 0.8  PROT 5.9* 5.6* 5.9*  ALBUMIN 2.4* 2.2* 2.1*   No results for input(s): LIPASE, AMYLASE in the last 168 hours. No results for input(s): AMMONIA in the last 168 hours. Coagulation Profile: No results for input(s): INR, PROTIME in the last 168 hours. Cardiac Enzymes: No results for input(s): CKTOTAL, CKMB, CKMBINDEX, TROPONINI in the last 168 hours. BNP (last 3 results) No results for input(s): PROBNP in the last 8760 hours. HbA1C: No results for input(s): HGBA1C in the last 72 hours. CBG: Recent Labs  Lab 09/23/17 1155 09/23/17 1538 09/23/17 1733 09/23/17 2350 09/24/17 0606  GLUCAP 136* 149* 143* 165* 155*   Lipid Profile: No results for input(s): CHOL, HDL, LDLCALC, TRIG, CHOLHDL, LDLDIRECT in the last 72 hours. Thyroid Function  Tests: No results for input(s): TSH, T4TOTAL, FREET4, T3FREE, THYROIDAB in the last 72 hours. Anemia Panel: No results for input(s): VITAMINB12, FOLATE, FERRITIN, TIBC, IRON, RETICCTPCT in the last 72 hours. Sepsis Labs: No results for input(s): PROCALCITON, LATICACIDVEN in the last 168 hours.  Recent Results (from the past 240 hour(s))  Culture, Urine     Status: None   Collection Time: 09/14/17  4:30 PM  Result Value Ref Range Status   Specimen Description URINE, RANDOM  Final   Special Requests NONE  Final   Culture   Final    NO GROWTH Performed at [facility], [address], [place], [place] [address]    Report Status 09/16/2017 FINAL  Final  Surgical PCR screen     Status: None   Collection Time: 09/23/17 12:03 AM  Result Value Ref Range Status   MRSA, PCR NEGATIVE NEGATIVE Final   Staphylococcus aureus NEGATIVE NEGATIVE Final    Comment: (NOTE) The Xpert SA Assay (FDA  approved for NASAL specimens in patients 62 years of age and older), is one component of a comprehensive surveillance program. It is not intended to diagnose infection nor to guide or monitor treatment.          Radiology Studies: No results found.      Scheduled Meds: . enoxaparin (LOVENOX) injection  40 mg Subcutaneous Q24H  . feeding supplement  1 Container Oral BID BM  . insulin aspart  0-9 Units Subcutaneous Q6H  . morphine   Intravenous Q4H  . phenytoin (DILANTIN) IV  100 mg Intravenous QHS   Continuous Infusions: . Marland KitchenTPN (CLINIMIX-E) Adult 75 mL/hr at 09/23/17 1730  . Marland KitchenTPN (CLINIMIX-E) Adult     And  . fat emulsion    . sodium chloride Stopped (09/23/17 1243)     LOS: 9 days    Time spent: 35 minutes    Irine Seal, MD [facility] Pager [phone]  If 7PM-7AM, please contact night-coverage [email or web address] Password [email or web address] 09/24/2017, 11:00 AM

## 2017-09-24 NOTE — Progress Notes (Addendum)
1 Day Post-Op   Subjective/Chief Complaint: No complaints. Likes having tube out of her nose   Objective: Vital signs in last 24 hours: Temp:  [98.2 F (36.8 C)-98.7 F (37.1 C)] 98.7 F (37.1 C) (01/11 0609) Pulse Rate:  [96-114] 114 (01/11 0609) Resp:  [15-24] 20 (01/11 0609) BP: (104-146)/(75-85) 104/75 (01/11 0609) SpO2:  [95 %-100 %] 97 % (01/11 0609) Last BM Date: 09/22/17  Intake/Output from previous day: 01/10 0701 - 01/11 0700 In: 4888.3 [I.V.:4388.3; IV Piggyback:500] Out: 5638 [Urine:675; Emesis/NG output:900; Drains:100; Blood:100] Intake/Output this shift: No intake/output data recorded.  General appearance: alert and cooperative Resp: clear to auscultation bilaterally Cardio: regular rate and rhythm GI: soft, appropriately tender. incision ok  Lab Results:  Recent Labs    09/22/17 0334 09/24/17 0403  WBC 9.5 18.7*  HGB 12.2 14.0  HCT 38.1 43.0  PLT 238 296   BMET Recent Labs    09/23/17 0444 09/24/17 0403  NA 139 136  K 4.2 4.7  CL 108 107  CO2 25 26  GLUCOSE 130* 156*  BUN 19 25*  CREATININE 0.70 0.78  CALCIUM 8.1* 7.7*   PT/INR No results for input(s): LABPROT, INR in the last 72 hours. ABG No results for input(s): PHART, HCO3 in the last 72 hours.  Invalid input(s): PCO2, PO2  Studies/Results: No results found.  Anti-infectives: Anti-infectives (From admission, onward)   Start     Dose/Rate Route Frequency Ordered Stop   09/23/17 1249  cefoTEtan in Dextrose 5% (CEFOTAN) 2-2.08 GM-%(50ML) IVPB    Comments:  [name]   : cabinet override      09/23/17 1249 09/24/17 0059   09/23/17 1100  cefoTEtan (CEFOTAN) 2 g in dextrose 5 % 50 mL IVPB     2 g 100 mL/hr over 30 Minutes Intravenous  Once 09/22/17 1549 09/23/17 1333      Assessment/Plan: s/p Procedure(s): EXPLORATORY LAPAROTOMY WITH PALLIATIVE SMALL BOWEL TO RIGHT COLON BY PASS WITH PLACEMENT OF GASTROSTOMY TUBE (N/A) Continue g tube to drain. allow sips of  clears OOB today PCA for pain control lovenox for dvt prophylaxis  LOS: 9 days    TOTH III,[place] Yard S 09/24/2017

## 2017-09-24 NOTE — Progress Notes (Signed)
IP PROGRESS NOTE  Subjective:   Christie Maynard underwent an upper endoscopy and colonoscopy 09/22/2017.  A large ulcerated mass was found at the cecum.  The mass was biopsied.  The stomach and duodenum appeared normal.  Esophagitis was noted in the distal half of the esophagus.  The pathology from the cecum revealed adenocarcinoma with signet ring features.  She was taken to the operating room by Dr. Marlou Starks on 09/23/2017 for an exploratory laparotomy.  2-3 L of ascites were evacuated.  A large fixed tumor was noted in the pelvis.  There was a large fixed mass in the cecum.  Innumerable implants were noted at the abdominal wall and omentum.  Implants were noted to invade the small intestine.  The tumor was not resectable.  A palliative bypass was performed between the small bowel and right colon.  A gastrostomy tube was placed.  Objective: Vital signs in last 24 hours: Blood pressure 104/75, pulse (!) 114, temperature 98.7 F (37.1 C), temperature source Oral, resp. rate (!) 35, height 5' 2"  (1.575 m), weight 139 lb (63 kg), SpO2 97 %.  Intake/Output from previous day: 01/10 0701 - 01/11 0700 In: 4888.3 [I.V.:4388.3; IV Piggyback:500] Out: 3546 [Urine:675; Emesis/NG output:900; Drains:100; Blood:100]  Physical Exam: Not performed today    Lab Results: Recent Labs    09/22/17 0334 09/24/17 0403  WBC 9.5 18.7*  HGB 12.2 14.0  HCT 38.1 43.0  PLT 238 296    BMET Recent Labs    09/23/17 0444 09/24/17 0403  NA 139 136  K 4.2 4.7  CL 108 107  CO2 25 26  GLUCOSE 130* 156*  BUN 19 25*  CREATININE 0.70 0.78  CALCIUM 8.1* 7.7*    Lab Results  Component Value Date   CEA1 2,751.0 (H) 09/16/2017    Studies/Results: No results found.  Medications: I have reviewed the patient's current medications.  Assessment/Plan: 1. Metastatic colon cancer  Initial presentation with nausea and bloating.   CT abdomen/pelvis 09/03/2017 showed a multilobulated irregular heterogeneous enhancing  soft tissue mass within the pelvis either involving both ovaries or a single mass arising from the right and extending across the midline with components measuring 10.6 x 4.9 x 9.8 cm and 8.2 x 4.9 x 4.8 cm.  There was associated ascites and omental caking consistent with peritoneal carcinomatosis.  A distal small bowel obstruction was suspected.  There was distal left ureteral obstruction with left hydronephrosis and hydroureter.    Ultrasound paracentesis was performed 09/04/2017.  Atypical cells were present, suspicious for adenocarcinoma of the GI tract.   Paracentesis 09/13/2017 with atypical cells present.  CT 09/14/2017 showed a progressing small bowel obstruction with diffusely dilated fluid-filled small bowel.  Transition zone in the right lower quadrant.  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  Right lower quadrant mass measuring 4.4 cm.    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  Nonspecific staining for PAX-8.  Negative for estrogen receptor, progesterone receptor and cytokeratin 7.  Colonoscopy 09/22/2017-cecum mass, biopsy confirmed adenocarcinoma with signet cell features  Exploratory laparotomy 09/23/2017- fixed pelvic and cecum masses, carcinomatosis with tumor implants at the abdominal wall, bowel, and omentum, status post a palliative small bowel to right colon bypass and placement of a gastrostomy tube 2.  Small bowel obstruction secondary to carcinomatosis and a right cecum tumor, status post palliative small bowel to right colon bypass 09/23/2017 3. Distal left ureteral obstruction with left hydronephrosis and hydroureter status post left ureteral stent placement  09/05/2017. 4. Seizure 1983. 5. Significant family history for breast cancer.   Christie Maynard has been diagnosed with metastatic colon cancer.  She underwent an exploratory laparotomy yesterday that confirmed an unresectable cecal mass and carcinomatosis.  Dr. Marlou Starks was able to perform a palliative bypass and placement of a gastrostomy tube.  I discussed the diagnosis with Christie Maynard and her husband.  No therapy will be curative.  I explained there are many systemic therapy options for patients with metastatic colon cancer.  We will plan to begin systemic therapy when she has recovered from surgery.  Recommendations:  1.  Postoperative care per Dr. Marlou Starks 2.  Port-A-Cath placement when appropriate per surgery 3.  Submit the peritoneal biopsy from 09/17/2017 for Foundation 1 testing 4.  Outpatient follow-up at the Cancer center will be scheduled for approximately 2 weeks  Please call Oncology as needed.  I will check on her 09/27/2017.     LOS: 9 days   Betsy Coder, MD   09/24/2017, 3:05 PM

## 2017-09-24 NOTE — Progress Notes (Signed)
PHARMACY - ADULT TOTAL PARENTERAL NUTRITION CONSULT NOTE   Pharmacy Consult for TPN Indication: bowel obstruction  Patient Measurements: Height: 5\' 2"  (157.5 cm) Weight: 139 lb (63 kg) IBW/kg (Calculated) : 50.1 TPN AdjBW (KG): 53.3 Body mass index is 25.42 kg/m. Usual Weight: 68 kg  Insulin Requirements: 5 units sensitive SSI / 24 hrs  Current Nutrition: NPO surgery 1/10 - Boost BID (last dose taken on 1/8)  IVF: NS at 10 ml/hr  Central access: PICC placed 1/6 TPN start date: 1/6  ASSESSMENT                                                                                                          HPI: Pt with PMH of seizures and recently diagnosed peritoneal carcinomatosis with bowel obstruction/colonic obstruction secondary to malignancy of unknown primary presents with N/V, admitted for further management/evaluation. CT abdomen/pelviswith contrast shows progressing SBO. Having BMs but with 1450 ml from NGT, Surgery recommends starting TPN. Patient with significant recent weight loss but primarily d/t multiple large-volume paracentesis. Poor intake since week before Xmas, and unable to keep anything down since 12/31. Appears to be at risk for refeeding.  Significant events:  1/9 endoscopy/colonoscopy showed mass in cecum 1/10:  Exp lap with palliative small bowel to right colon bypass with placement of gastrostomy  Today:   Glucose (goal <150):  143-165  Electrolytes: Na, K,  CorrCa WNL.      Renal: scr wnl  LFTs: slightly elevated on admit, now WNL (1/10)  TGs: slightly elevated at 180 (1/7)  Prealbumin: 8.4 (1.7)  NUTRITIONAL GOALS                                                                                             RD recs (09/20/17):  Kcal:  1700-1900 Protein:  80-90 grams Fluid:  >/= 1.7 L/d  Clinimix 5/15 at a goal rate of 75 ml/hr + 20% fat emulsion 240 ml to provide: 90 g/day protein, 1758 Kcal/day.  PLAN  At 1800 today:  Continue Clinimix E 5/15 at goal rate of 75 ml/hr  20% fat emulsion at 20 ml/hr x 12 hr  TPN to contain standard multivitamins and trace elements  IVF at [place] sensitive SSI with q6h CBG checks  TPN lab panels on Mondays & Thursdays  F/u daily  Dia Sitter, PharmD, BCPS 09/24/2017 8:52 AM

## 2017-09-24 NOTE — Progress Notes (Signed)
Nutrition Follow-up  DOCUMENTATION CODES:   Not applicable  INTERVENTION:    TPN per pharmacy  Monitor for diet advancement/toleration  Boost Breeze po TID, each supplement provides 250 kcal and 9 grams of protein  NUTRITION DIAGNOSIS:   Increased nutrient needs related to chronic illness, cancer and cancer related treatments, catabolic illness as evidenced by estimated needs.  Ongoing  GOAL:   Patient will meet greater than or equal to 90% of their needs  Meeting with TPN  MONITOR:   PO intake, Supplement acceptance, Weight trends, I & O's, Labs  REASON FOR ASSESSMENT:   Malnutrition Screening Tool    ASSESSMENT:   Pt with PMH of seizures and recently diagnosed peritoneal carcinomatosis with bowel obstruction/colonic obstruction secondary to malignancy of unknown primary presents with N/V, admitted for further management/evaluation.   1/9- endo/colonoscopy showed cecal mass 1/10- exploratory laparotomy with palliative small bowel to right colon bypass, placement of G-tube 1/11- advanced to clears  NGT discontinued. Pt has gastrostomy tube to drain. Pt currently receiving Clinimix 5/15 @ 75 ml/hr + 20% ILE at 20 ml/hr x12 hours. Provides 1758 calories and 90 grams of protein. This meets 100% of calorie and protein needs.   Spoke with pt who had first clear liquid tray at bedside. States she had a couple bites of broth with a couple tips of tea. Denies any abdominal pain or nausea. Clear liquid supplement ordered. Plan to wean TPN once PO intake increases. No recent weight has been obtained since last RD visit, would recommend checking daily weights to monitor trends.   Medications reviewed and include: SSI Labs reviewed.   Diet Order:  .TPN (CLINIMIX-E) Adult Diet clear liquid Room service appropriate? Yes; Fluid consistency: Thin .TPN (CLINIMIX-E) Adult  EDUCATION NEEDS:   Not appropriate for education at this time  Skin:  Skin Assessment: Reviewed RN  Assessment  Last BM:  09/22/17- after enema  Height:   Ht Readings from Last 1 Encounters:  09/22/17 5\' 2"  (1.575 m)    Weight:   Wt Readings from Last 1 Encounters:  09/22/17 139 lb (63 kg)    Ideal Body Weight:  50 kg  BMI:  Body mass index is 25.42 kg/m.  Estimated Nutritional Needs:   Kcal:  1700-1900  Protein:  80-90 grams  Fluid:  >/= 1.7 L/d    Mariana Single RD, LDN Clinical Nutrition Pager # [phone]

## 2017-09-25 ENCOUNTER — Inpatient Hospital Stay (HOSPITAL_COMMUNITY): Payer: BLUE CROSS/BLUE SHIELD

## 2017-09-25 DIAGNOSIS — D72829 Elevated white blood cell count, unspecified: Secondary | ICD-10-CM | POA: Clinically undetermined

## 2017-09-25 LAB — CBC
HCT: 37.6 % (ref 36.0–46.0)
Hemoglobin: 12.1 g/dL (ref 12.0–15.0)
MCH: 28.3 pg (ref 26.0–34.0)
MCHC: 32.2 g/dL (ref 30.0–36.0)
MCV: 87.9 fL (ref 78.0–100.0)
PLATELETS: 267 10*3/uL (ref 150–400)
RBC: 4.28 MIL/uL (ref 3.87–5.11)
RDW: 15.1 % (ref 11.5–15.5)
WBC: 22.3 10*3/uL — AB (ref 4.0–10.5)

## 2017-09-25 LAB — BASIC METABOLIC PANEL
ANION GAP: 6 (ref 5–15)
BUN: 34 mg/dL — ABNORMAL HIGH (ref 6–20)
CALCIUM: 7.7 mg/dL — AB (ref 8.9–10.3)
CO2: 25 mmol/L (ref 22–32)
Chloride: 105 mmol/L (ref 101–111)
Creatinine, Ser: 0.86 mg/dL (ref 0.44–1.00)
GLUCOSE: 140 mg/dL — AB (ref 65–99)
Potassium: 4.8 mmol/L (ref 3.5–5.1)
Sodium: 136 mmol/L (ref 135–145)

## 2017-09-25 LAB — URINALYSIS, ROUTINE W REFLEX MICROSCOPIC
Bilirubin Urine: NEGATIVE
GLUCOSE, UA: NEGATIVE mg/dL
KETONES UR: NEGATIVE mg/dL
NITRITE: NEGATIVE
PH: 5 (ref 5.0–8.0)
PROTEIN: 30 mg/dL — AB
Specific Gravity, Urine: 1.019 (ref 1.005–1.030)
Squamous Epithelial / LPF: NONE SEEN

## 2017-09-25 LAB — GLUCOSE, CAPILLARY
GLUCOSE-CAPILLARY: 122 mg/dL — AB (ref 65–99)
Glucose-Capillary: 134 mg/dL — ABNORMAL HIGH (ref 65–99)
Glucose-Capillary: 111 mg/dL — ABNORMAL HIGH (ref 65–99)
Glucose-Capillary: 125 mg/dL — ABNORMAL HIGH (ref 65–99)

## 2017-09-25 LAB — PHOSPHORUS: Phosphorus: 3.5 mg/dL (ref 2.5–4.6)

## 2017-09-25 LAB — MAGNESIUM: Magnesium: 2.1 mg/dL (ref 1.7–2.4)

## 2017-09-25 MED ORDER — FAT EMULSION 20 % IV EMUL
240.0000 mL | INTRAVENOUS | Status: AC
  Administered 2017-09-25: 240 mL via INTRAVENOUS
  Filled 2017-09-25: qty 250

## 2017-09-25 MED ORDER — PIPERACILLIN-TAZOBACTAM 3.375 G IVPB
3.3750 g | Freq: Three times a day (TID) | INTRAVENOUS | Status: DC
  Administered 2017-09-25 – 2017-09-30 (×15): 3.375 g via INTRAVENOUS
  Filled 2017-09-25 (×15): qty 50

## 2017-09-25 MED ORDER — TRACE MINERALS CR-CU-MN-SE-ZN 10-1000-500-60 MCG/ML IV SOLN
INTRAVENOUS | Status: AC
  Administered 2017-09-25: 18:00:00 via INTRAVENOUS
  Filled 2017-09-25: qty 1800

## 2017-09-25 NOTE — Progress Notes (Signed)
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 Patient reports poor oral intake and unable to eat solid foods for past few weeks but 24 hours prior to this admission, she felt a lot worse, bloated, uncomfortable. Off note patient had appointment with gastroenterologist scheduled for 09/15/2017 due to cytology results concerning for adenocarcinoma.    Assessment & Plan:   Principal Problem:   SBO (small bowel obstruction) (HCC) Active Problems:   Colonic mass   Ascites   Nausea & vomiting   Peritoneal carcinomatosis (HCC)   History of seizures   Nausea and vomiting   Dehydration   Neoplasm of colon, malignant (HCC)   Leukocytosis   Peritoneal Carcinomatosis  Ascites  Nausea and vomiting  Small Bowel Obstruction/cecal mass:  Pt was following with Dr. 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  - repeat x ray 1/8 with persistent partial SBO - surgery c/s - TPN for nutrition support.  Patient underwent upper endoscopy 09/22/2017, which was unremarkable, colonoscopy was concerning for cecal mass.  Patient has been seen in consultation by general surgery and patient status post exploratory laparotomy with palliative small bowel to right  colon bypass with placement of gastrostomy tube per Dr. Marlou Starks 09/23/2017.  General surgery following.  Oncology following.  Continue antiemetics.  Supportive care.   Hypokalemia  Hypophosphatemia: Potassium has been repleted.  Follow.    Anion gap metabolic acidosis:improved  L ureteral obstruction s/p stent placement: Creatinine stable.  Outpatient follow-up.    Abnormal UA: Initial urine cultures were negative.  Patient now with a worsening leukocytosis and as such we will repeat UA with cultures and sensitivities.    Leukocytosis: Worsened postop. Will check a chest x-ray.  Check a UA with cultures and sensitivities.  Patient has already been started empirically on IV Zosyn per general surgery.  Follow.  Mildly elevated liver enzymes: improved  Mildly elevated lipase: imaging no signs of pancreatitis.  Patient clinically with no signs of pancreatitis.  Monitor for now.    History of seizures:has had 1 seizure in 1980's, none since.  No seizures overnight.  Continue IV Dilantin while n.p.o.         DVT prophylaxis: Lovenox Code Status: Full Family Communication: Updated patient. No family at bedside.  Disposition Plan: Pending hospitalization.  May need skilled nursing facility.  Per general surgery.   Consultants:   Otology/oncology Dr. Benay Spice [phone]  Gastroenterology Dr. Oletta Lamas 09/15/2017  General surgery Dr. Harlow Asa 09/15/2017  GYN oncology  Procedures:   Endoscopy per Dr. Cristina Gong 09/22/2016  Colonoscopy per Dr. Cristina Gong 09/22/2017  CT abdomen and pelvis 09/14/2017  CT-guided core biopsy of peritoneal tumor per Dr. Kathlene Cote 09/17/2017  Abdominal x-ray 09/15/2017, 10/01/2017  Acute abdominal series 09/14/2017  Exploratory laparotomy with palliative small bowel to right colon bypass with placement of gastrostomy tube per  Dr. Marlou Starks 09/23/2017  Antimicrobials:   IV Zosyn 09/25/2017     Subjective: Patient laying in bed.  Tolerating current clear liquids.  No  nausea or emesis.  Still with some diffuse abdominal pain.  No flatus.  No bowel movement.  Patient states was burping and had some hiccups.   Objective: Vitals:   09/25/17 0002 09/25/17 0039 09/25/17 0548 09/25/17 0555  BP: 121/69  (!) 102/56   Pulse: (!) 111  (!) 104   Resp: 15 20 17 18   Temp: 98.7 F (37.1 C)  97.8 F (36.6 C)   TempSrc: Oral  Axillary   SpO2: 97% 94% 95% 95%  Weight:      Height:        Intake/Output Summary (Last 24 hours) at 09/25/2017 1130 Last data filed at 09/25/2017 0548 Gross per 24 hour  Intake 1256.25 ml  Output 1520 ml  Net -263.75 ml   Filed Weights   09/14/17 1127 09/14/17 2110 09/22/17 1217  Weight: 68 kg (150 lb) 63.4 kg (139 lb 12.8 oz) 63 kg (139 lb)    Examination:  General exam: NAD Respiratory system: Clear to auscultation bilaterally anterior lung fields.  No wheezes, no rhonchi.  Fair air movement.  Respiratory effort normal. Cardiovascular system: Regular rate rhythm no murmurs rubs or gallops.  No JVD.  No pedal edema.  Gastrointestinal system: Abdomen is soft, some diffuse tenderness to palpation, no hypoactive bowel sounds. No guarding.  No rebound.  No hepatosplenomegaly.  G-tube intact. Central nervous system: Alert and oriented. No focal neurological deficits. Extremities: Symmetric 5 x 5 power. Skin: No rashes, lesions or ulcers Psychiatry: Judgement and insight appear normal. Mood & affect appropriate.     Data Reviewed: I have personally reviewed following labs and imaging studies  CBC: Recent Labs  Lab 09/20/17 0421 09/21/17 0426 09/22/17 0334 09/24/17 0403 09/25/17 0518  WBC 8.9 9.3 9.5 18.7* 22.3*  NEUTROABS 6.8  --   --  14.5*  --   HGB 13.0 12.3 12.2 14.0 12.1  HCT 40.8 39.1 38.1 43.0 37.6  MCV 89.3 89.3 88.6 88.1 87.9  PLT 401* 274 238 296 024   Basic Metabolic Panel: Recent Labs  Lab 09/20/17 0421 09/21/17 0426 09/22/17 0334 09/23/17 0444 09/24/17 0403 09/25/17 0518  NA 145 141 140 139 136 136   K 2.8* 3.5 3.5 4.2 4.7 4.8  CL 114* 113* 111 108 107 105  CO2 24 24 24 25 26 25   GLUCOSE 152* 139* 131* 130* 156* 140*  BUN 15 14 16 19  25* 34*  CREATININE 0.65 0.56 0.58 0.70 0.78 0.86  CALCIUM 8.0* 7.8* 7.6* 8.1* 7.7* 7.7*  MG 2.1 2.3 2.1 2.3  --  2.1  PHOS 2.2* 2.4* 3.0 2.8  --  3.5   GFR: Estimated Creatinine Clearance: 58.5 mL/min (by C-G formula based on SCr of 0.86 mg/dL). Liver Function Tests: Recent Labs  Lab 09/20/17 0421 09/21/17 0426 09/23/17 0444  AST 24 30 37  ALT 21 26 29   ALKPHOS 79 77 116  BILITOT 0.9 0.8 0.8  PROT 5.9* 5.6* 5.9*  ALBUMIN 2.4* 2.2* 2.1*   No results for input(s): LIPASE, AMYLASE in the last 168 hours. No results for input(s): AMMONIA in the last 168 hours. Coagulation Profile: No results for input(s): INR, PROTIME in the last 168 hours. Cardiac Enzymes: No results for input(s): CKTOTAL, CKMB, CKMBINDEX, TROPONINI in the last 168 hours. BNP (last 3 results) No results for input(s): PROBNP in the last 8760 hours.  HbA1C: No results for input(s): HGBA1C in the last 72 hours. CBG: Recent Labs  Lab 09/24/17 0606 09/24/17 1157 09/24/17 1734 09/25/17 0000 09/25/17 0543  GLUCAP 155* 163* 152* 169* 134*   Lipid Profile: No results for input(s): CHOL, HDL, LDLCALC, TRIG, CHOLHDL, LDLDIRECT in the last 72 hours. Thyroid Function Tests: No results for input(s): TSH, T4TOTAL, FREET4, T3FREE, THYROIDAB in the last 72 hours. Anemia Panel: No results for input(s): VITAMINB12, FOLATE, FERRITIN, TIBC, IRON, RETICCTPCT in the last 72 hours. Sepsis Labs: No results for input(s): PROCALCITON, LATICACIDVEN in the last 168 hours.  Recent Results (from the past 240 hour(s))  Surgical PCR screen     Status: None   Collection Time: 09/23/17 12:03 AM  Result Value Ref Range Status   MRSA, PCR NEGATIVE NEGATIVE Final   Staphylococcus aureus NEGATIVE NEGATIVE Final    Comment: (NOTE) The Xpert SA Assay (FDA approved for NASAL specimens in patients  66 years of age and older), is one component of a comprehensive surveillance program. It is not intended to diagnose infection nor to guide or monitor treatment.          Radiology Studies: Dg Chest Port 1 View  Result Date: 09/25/2017 CLINICAL DATA:  Leukocytosis and status post laparotomy and bowel bypass for colon carcinoma. EXAM: PORTABLE CHEST 1 VIEW COMPARISON:  08/30/2017 FINDINGS: Right arm PICC line extends to the distal SVC. The heart size and mediastinal contours are within normal limits. Lung volumes are low with bibasilar atelectasis present. There may be small bilateral pleural effusions. There is no evidence of pulmonary edema, consolidation, pneumothorax or nodules. The visualized skeletal structures are unremarkable. IMPRESSION: Low lung volumes with bibasilar atelectasis. Possible small bilateral pleural effusions. Electronically Signed   By: Aletta Edouard M.D.   On: 09/25/2017 10:39        Scheduled Meds: . enoxaparin (LOVENOX) injection  40 mg Subcutaneous Q24H  . feeding supplement  1 Container Oral TID BM  . insulin aspart  0-9 Units Subcutaneous Q6H  . morphine   Intravenous Q4H  . phenytoin (DILANTIN) IV  100 mg Intravenous QHS   Continuous Infusions: . Marland KitchenTPN (CLINIMIX-E) Adult 75 mL/hr at 09/24/17 1905  . Marland KitchenTPN (CLINIMIX-E) Adult    . sodium chloride Stopped (09/23/17 1243)  . fat emulsion    . piperacillin-tazobactam (ZOSYN)  IV       LOS: 10 days    Time spent: 35 minutes    Irine Seal, MD [facility] Pager [phone]  If 7PM-7AM, please contact night-coverage [email or web address] Password [email or web address] 09/25/2017, 11:30 AM

## 2017-09-25 NOTE — Progress Notes (Signed)
PHARMACY - ADULT TOTAL PARENTERAL NUTRITION CONSULT NOTE   Pharmacy Consult for TPN Indication: bowel obstruction  Patient Measurements: Height: 5\' 2"  (157.5 cm) Weight: 139 lb (63 kg) IBW/kg (Calculated) : 50.1 TPN AdjBW (KG): 53.3 Body mass index is 25.42 kg/m. Usual Weight: 68 kg  Insulin Requirements: 8 units sensitive SSI / 24 hrs  Current Nutrition:  - Clear Liquid diet started 1/11 - Boost TID between meals  IVF: NS at 10 ml/hr  Central access: PICC placed 1/6 TPN start date: 1/6  ASSESSMENT                                                                                                          HPI: Pt with PMH of seizures and recently diagnosed peritoneal carcinomatosis with bowel obstruction/colonic obstruction secondary to malignancy of unknown primary presents with N/V, admitted for further management/evaluation. CT abdomen/pelviswith contrast shows progressing SBO. Having BMs but with 1450 ml from NGT, Surgery recommends starting TPN. Patient with significant recent weight loss but primarily d/t multiple large-volume paracentesis. Poor intake since week before Xmas, and unable to keep anything down since 12/31. Appears to be at risk for refeeding.  Significant events:  1/9 endoscopy/colonoscopy showed mass in cecum 1/10:  Exp lap with palliative small bowel to right colon bypass with placement of gastrostomy  Recent Labs    09/23/17 0444 09/24/17 0403 09/25/17 0518  NA 139 136 136  K 4.2 4.7 4.8  CL 108 107 105  CO2 25 26 25   GLUCOSE 130* 156* 140*  BUN 19 25* 34*  CREATININE 0.70 0.78 0.86  CALCIUM 8.1* 7.7* 7.7*  PHOS 2.8  --  3.5  MG 2.3  --  2.1  ALBUMIN 2.1*  --   --   ALKPHOS 116  --   --   AST 37  --   --   ALT 29  --   --   BILITOT 0.8  --   --   Corr Ca 9.2 (09/25/17)  Today:   Glucose (goal <150):  134 - 163  Electrolytes: Na, K,  CorrCa WNL.      Renal: Scr wnl  LFTs: slightly elevated on admit, now below ULN (1/10)  TGs: slightly  elevated at 180 (1/7)  Prealbumin: 8.4 (1.7)  NUTRITIONAL GOALS                                                                                             RD recs (09/20/17):  Kcal:  1700-1900 Protein:  80-90 grams Fluid:  >/= 1.7 L/d  Clinimix 5/15 at a goal rate of 75 ml/hr + 20% fat emulsion 240 ml to provide: 90 g/day protein, 1758 Kcal/day.  PLAN                                                                                                                          At 1800 today:  Continue Clinimix E 5/15 at goal rate of 75 ml/hr  20% fat emulsion at 20 ml/hr x 12 hr  TPN to contain standard multivitamins and trace elements  IVF at [place] sensitive SSI with q6h CBG checks  TPN lab panels on Mondays & Thursdays  F/u daily  Clayburn Pert, PharmD, BCPS Pager: [phone] 09/25/2017  9:15 AM

## 2017-09-25 NOTE — Progress Notes (Signed)
Patient ambulated out to hallway and back.  Tolerated well.  Encouraged patient multiple times throughout the day to do her IS approximately 10 times every hour while awake, but patient inconsistent with this.  Patient refused to sit in chair after ambulation, but stated she would attempt ambulation again with night shift this evening.  Will continue to monitor.

## 2017-09-25 NOTE — Progress Notes (Addendum)
2 Days Post-Op   Subjective/Chief Complaint: No complaints. Likes drinking grape juice   Objective: Vital signs in last 24 hours: Temp:  [97.8 F (36.6 C)-98.7 F (37.1 C)] 97.8 F (36.6 C) (01/12 0548) Pulse Rate:  [104-111] 104 (01/12 0548) Resp:  [15-35] 18 (01/12 0555) BP: (102-121)/(56-69) 102/56 (01/12 0548) SpO2:  [94 %-98 %] 95 % (01/12 0555) Last BM Date: 10/04/17  Intake/Output from previous day: 01/11 0701 - 01/12 0700 In: 1376.3 [P.O.:240; I.V.:1136.3] Out: 1520 [Urine:720; Drains:800] Intake/Output this shift: No intake/output data recorded.  General appearance: alert and cooperative Resp: rales LLL Cardio: regular rate and rhythm GI: soft, mild tenderness. incision looks good  Lab Results:  Recent Labs    09/24/17 0403 09/25/17 0518  WBC 18.7* 22.3*  HGB 14.0 12.1  HCT 43.0 37.6  PLT 296 267   BMET Recent Labs    09/24/17 0403 09/25/17 0518  NA 136 136  K 4.7 4.8  CL 107 105  CO2 26 25  GLUCOSE 156* 140*  BUN 25* 34*  CREATININE 0.78 0.86  CALCIUM 7.7* 7.7*   PT/INR No results for input(s): LABPROT, INR in the last 72 hours. ABG No results for input(s): PHART, HCO3 in the last 72 hours.  Invalid input(s): PCO2, PO2  Studies/Results: No results found.  Anti-infectives: Anti-infectives (From admission, onward)   Start     Dose/Rate Route Frequency Ordered Stop   09/23/17 1249  cefoTEtan in Dextrose 5% (CEFOTAN) 2-2.08 GM-%(50ML) IVPB    Comments:  [name]   : cabinet override      09/23/17 1249 09/24/17 0059   09/23/17 1100  cefoTEtan (CEFOTAN) 2 g in dextrose 5 % 50 mL IVPB     2 g 100 mL/hr over 30 Minutes Intravenous  Once 09/22/17 1549 09/23/17 1333      Assessment/Plan: s/p Procedure(s): EXPLORATORY LAPAROTOMY WITH PALLIATIVE SMALL BOWEL TO RIGHT COLON BY PASS WITH PLACEMENT OF GASTROSTOMY TUBE (N/A) Allow clears for comfort until bowel function returns  No fever but wbc up. Check urine and cxr. Consider empiric  abx Continue g tube to drain Peritoneal carcinomatosis for colon cancer Continue tpn for nutritional support  LOS: 10 days    TOTH III,Maezie Justin S 09/25/2017

## 2017-09-25 NOTE — Progress Notes (Signed)
Pharmacy Antibiotic Note  Christie Maynard is a 62 y.o. female admitted on 09/14/2017, now with leukocytosis r/o pna, r/o UTI.  Pharmacy has been consulted for Zosyn dosing.  Plan: Zosyn 3.375 grams IV q8h (4-hour infusion) Follow cultures, clinical course  Height: 5\' 2"  (157.5 cm) Weight: 139 lb (63 kg) IBW/kg (Calculated) : 50.1  Temp (24hrs), Avg:98.1 F (36.7 C), Min:97.8 F (36.6 C), Max:98.7 F (37.1 C)  Recent Labs  Lab 09/20/17 0421 09/21/17 0426 09/22/17 0334 09/23/17 0444 09/24/17 0403 09/25/17 0518  WBC 8.9 9.3 9.5  --  18.7* 22.3*  CREATININE 0.65 0.56 0.58 0.70 0.78 0.86    Estimated Creatinine Clearance: 58.5 mL/min (by C-G formula based on SCr of 0.86 mg/dL).    No Known Allergies  Antimicrobials this admission: 1/10 >> Cefotan x 1 1/12 >> Zosyn  Dose adjustments this admission: n/a  Microbiology results: 1/10 MRSA PCR: negative 1/1   UCx: NGF 1/12 UCx: ordered   Thank you for allowing pharmacy to be a part of this patient's care.  Clayburn Pert, PharmD, BCPS Pager: [phone] 09/25/2017  10:57 AM

## 2017-09-26 DIAGNOSIS — N390 Urinary tract infection, site not specified: Secondary | ICD-10-CM | POA: Clinically undetermined

## 2017-09-26 LAB — CBC WITH DIFFERENTIAL/PLATELET
BASOS PCT: 0 %
Basophils Absolute: 0 10*3/uL (ref 0.0–0.1)
EOS ABS: 0 10*3/uL (ref 0.0–0.7)
EOS PCT: 0 %
HCT: 34.5 % — ABNORMAL LOW (ref 36.0–46.0)
Hemoglobin: 11.3 g/dL — ABNORMAL LOW (ref 12.0–15.0)
Lymphocytes Relative: 10 %
Lymphs Abs: 1.7 10*3/uL (ref 0.7–4.0)
MCH: 28.2 pg (ref 26.0–34.0)
MCHC: 32.8 g/dL (ref 30.0–36.0)
MCV: 86 fL (ref 78.0–100.0)
MONO ABS: 1.8 10*3/uL — AB (ref 0.1–1.0)
Monocytes Relative: 10 %
Neutro Abs: 14.3 10*3/uL — ABNORMAL HIGH (ref 1.7–7.7)
Neutrophils Relative %: 80 %
PLATELETS: 316 10*3/uL (ref 150–400)
RBC: 4.01 MIL/uL (ref 3.87–5.11)
RDW: 15.2 % (ref 11.5–15.5)
WBC: 17.8 10*3/uL — ABNORMAL HIGH (ref 4.0–10.5)

## 2017-09-26 LAB — BASIC METABOLIC PANEL
Anion gap: 7 (ref 5–15)
BUN: 33 mg/dL — ABNORMAL HIGH (ref 6–20)
CALCIUM: 7.8 mg/dL — AB (ref 8.9–10.3)
CO2: 26 mmol/L (ref 22–32)
CREATININE: 0.75 mg/dL (ref 0.44–1.00)
Chloride: 102 mmol/L (ref 101–111)
Glucose, Bld: 137 mg/dL — ABNORMAL HIGH (ref 65–99)
Potassium: 4.4 mmol/L (ref 3.5–5.1)
SODIUM: 135 mmol/L (ref 135–145)

## 2017-09-26 LAB — URINE CULTURE: CULTURE: NO GROWTH

## 2017-09-26 LAB — GLUCOSE, CAPILLARY
GLUCOSE-CAPILLARY: 126 mg/dL — AB (ref 65–99)
GLUCOSE-CAPILLARY: 134 mg/dL — AB (ref 65–99)
Glucose-Capillary: 134 mg/dL — ABNORMAL HIGH (ref 65–99)

## 2017-09-26 MED ORDER — SODIUM CHLORIDE 0.9 % IV BOLUS (SEPSIS)
500.0000 mL | Freq: Once | INTRAVENOUS | Status: AC
  Administered 2017-09-26: 500 mL via INTRAVENOUS

## 2017-09-26 MED ORDER — TRACE MINERALS CR-CU-MN-SE-ZN 10-1000-500-60 MCG/ML IV SOLN
INTRAVENOUS | Status: AC
  Administered 2017-09-26: 19:00:00 via INTRAVENOUS
  Filled 2017-09-26: qty 1800

## 2017-09-26 MED ORDER — FAT EMULSION 20 % IV EMUL
240.0000 mL | INTRAVENOUS | Status: AC
  Administered 2017-09-26: 240 mL via INTRAVENOUS
  Filled 2017-09-26: qty 250

## 2017-09-26 MED ORDER — SODIUM CHLORIDE 0.9 % IV SOLN
INTRAVENOUS | Status: DC
  Administered 2017-09-26 – 2017-09-27 (×2): via INTRAVENOUS

## 2017-09-26 NOTE — Progress Notes (Signed)
3 Days Post-Op   Subjective/Chief Complaint: Complains of some pain when she moves around. No nausea   Objective: Vital signs in last 24 hours: Temp:  [97.6 F (36.4 C)-98.6 F (37 C)] 98.6 F (37 C) (01/13 0003) Pulse Rate:  [101-106] 101 (01/13 0003) Resp:  [16-36] 18 (01/13 0406) BP: (110-121)/(68-73) 121/71 (01/13 0003) SpO2:  [94 %-96 %] 96 % (01/13 0406) Last BM Date: 10/04/17  Intake/Output from previous day: 01/12 0701 - 01/13 0700 In: 2662.3 [P.O.:150; I.V.:1362.3; IV Piggyback:150] Out: 1500 [Urine:600; Drains:900] Intake/Output this shift: No intake/output data recorded.  General appearance: alert and cooperative Resp: clear to auscultation bilaterally Cardio: regular rate and rhythm GI: soft, appropriately tender. incision looks good  Lab Results:  Recent Labs    09/25/17 0518 09/26/17 0402  WBC 22.3* 17.8*  HGB 12.1 11.3*  HCT 37.6 34.5*  PLT 267 316   BMET Recent Labs    09/25/17 0518 09/26/17 0402  NA 136 135  K 4.8 4.4  CL 105 102  CO2 25 26  GLUCOSE 140* 137*  BUN 34* 33*  CREATININE 0.86 0.75  CALCIUM 7.7* 7.8*   PT/INR No results for input(s): LABPROT, INR in the last 72 hours. ABG No results for input(s): PHART, HCO3 in the last 72 hours.  Invalid input(s): PCO2, PO2  Studies/Results: Dg Chest Port 1 View  Result Date: 09/25/2017 CLINICAL DATA:  Leukocytosis and status post laparotomy and bowel bypass for colon carcinoma. EXAM: PORTABLE CHEST 1 VIEW COMPARISON:  08/30/2017 FINDINGS: Right arm PICC line extends to the distal SVC. The heart size and mediastinal contours are within normal limits. Lung volumes are low with bibasilar atelectasis present. There may be small bilateral pleural effusions. There is no evidence of pulmonary edema, consolidation, pneumothorax or nodules. The visualized skeletal structures are unremarkable. IMPRESSION: Low lung volumes with bibasilar atelectasis. Possible small bilateral pleural effusions.  Electronically Signed   By: Aletta Edouard M.D.   On: 09/25/2017 10:39    Anti-infectives: Anti-infectives (From admission, onward)   Start     Dose/Rate Route Frequency Ordered Stop   09/25/17 1200  piperacillin-tazobactam (ZOSYN) IVPB 3.375 g     3.375 g 12.5 mL/hr over 240 Minutes Intravenous Every 8 hours 09/25/17 1054     09/23/17 1249  cefoTEtan in Dextrose 5% (CEFOTAN) 2-2.08 GM-%(50ML) IVPB    Comments:  [name]   : cabinet override      09/23/17 1249 09/24/17 0059   09/23/17 1100  cefoTEtan (CEFOTAN) 2 g in dextrose 5 % 50 mL IVPB     2 g 100 mL/hr over 30 Minutes Intravenous  Once 09/22/17 1549 09/23/17 1333      Assessment/Plan: s/p Procedure(s): EXPLORATORY LAPAROTOMY WITH PALLIATIVE SMALL BOWEL TO RIGHT COLON BY PASS WITH PLACEMENT OF GASTROSTOMY TUBE (N/A) POD 4 from internal bypass and g tube  Continue g tube to drain and allow to drink for comfort Continue empiric zosyn. Wbc coming down Ambulate Metastatic colon cancer with carcinomatosis  LOS: 11 days    TOTH III,PAUL S 09/26/2017

## 2017-09-26 NOTE — Progress Notes (Signed)
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 Patient reports poor oral intake and unable to eat solid foods for past few weeks but 24 hours prior to this admission, she felt a lot worse, bloated, uncomfortable. Off note patient had appointment with gastroenterologist scheduled for 09/15/2017 due to cytology results concerning for adenocarcinoma.    Assessment & Plan:   Principal Problem:   SBO (small bowel obstruction) (HCC) Active Problems:   Colonic mass   Ascites   Nausea & vomiting   Peritoneal carcinomatosis (HCC)   History of seizures   Nausea and vomiting   Dehydration   Neoplasm of colon, malignant (HCC)   Leukocytosis   Peritoneal Carcinomatosis  Ascites  Nausea and vomiting  Small Bowel Obstruction/cecal mass:  Pt was following with Dr. 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  - repeat x ray 1/8 with persistent partial SBO - surgery c/s - TPN for nutrition support.  Patient underwent upper endoscopy 09/22/2017, which was unremarkable, colonoscopy was concerning for cecal mass.  Patient has been seen in consultation by general surgery and patient status post exploratory laparotomy with palliative small bowel to right  colon bypass with placement of gastrostomy tube per Dr. Marlou Starks 09/23/2017.  General surgery following.  Oncology following.  Continue antiemetics.  Supportive care.   Hypokalemia  Hypophosphatemia: Potassium has been repleted.  Follow.    Anion gap metabolic acidosis:improved  L ureteral obstruction s/p stent placement: Creatinine stable.  Outpatient follow-up.    Abnormal UA: Initial urine cultures were negative.  Patient now with a worsening leukocytosis.  A repeat UA nitrite negative, trace leukocytes.  Too numerous to count WBCs.  Urine culture is negative.    Leukocytosis: Worsened postop.  ChestX-ray negative for acute infiltrate.  Urinalysis cloudy, trace leukocytes, nitrite negative, too numerous to count WBCs, clumps present.  Urine culture negative.  WBC trending down.  Continue empiric IV Zosyn.  Continue incentive spirometry.   Mildly elevated liver enzymes: improved  Mildly elevated lipase: imaging no signs of pancreatitis.  Patient clinically with no signs of pancreatitis.  Monitor for now.    History of seizures:has had 1 seizure in 1980's, none since.  No seizures noted during this hospitalization.  Continue IV Dilantin for now.  Transition to oral Dilantin when patient tolerating a diet.         DVT prophylaxis: Lovenox Code Status: Full Family Communication: Updated patient. No family at bedside.  Disposition Plan: May need skilled nursing facility.  Per general surgery.   Consultants:   Otology/oncology Dr. Benay Spice [phone]  Gastroenterology Dr. Oletta Lamas 09/15/2017  General surgery Dr. Harlow Asa 09/15/2017  GYN oncology  Procedures:   Endoscopy per Dr. Cristina Gong 09/22/2016  Colonoscopy per Dr. Cristina Gong 09/22/2017  CT abdomen and pelvis 09/14/2017  CT-guided core biopsy of peritoneal tumor per Dr. Kathlene Cote  09/17/2017  Abdominal x-ray 09/15/2017, 10/01/2017  Acute abdominal series 09/14/2017  Exploratory laparotomy with palliative small bowel to right colon  bypass with placement of gastrostomy tube per Dr. Marlou Starks 09/23/2017  Chest x-ray 09/25/2017  Antimicrobials:   IV Zosyn 09/25/2017     Subjective: Patient sitting up in chair.  Patient states tolerating clear liquids.  Still some abdominal discomfort.  Patient states ambulated in the hallway yesterday.  No flatus.  Patient states having some hiccups and burping occasionally.   Objective: Vitals:   09/26/17 0003 09/26/17 0012 09/26/17 0406 09/26/17 1041  BP: 121/71     Pulse: (!) 101     Resp: 16 18 18 17   Temp: 98.6 F (37 C)     TempSrc: Oral     SpO2: 94% 95% 96% 96%  Weight:      Height:        Intake/Output Summary (Last 24 hours) at 09/26/2017 1242 Last data filed at 09/26/2017 1200 Gross per 24 hour  Intake 2662.25 ml  Output 1900 ml  Net 762.25 ml   Filed Weights   09/14/17 1127 09/14/17 2110 09/22/17 1217  Weight: 68 kg (150 lb) 63.4 kg (139 lb 12.8 oz) 63 kg (139 lb)    Examination:  General exam: NAD Respiratory system: Clear to auscultation bilaterally.  No crackles, no wheezes, no rhonchi.  Fair air movement.   Respiratory effort normal. Cardiovascular system: Tachycardia, no murmurs rubs or gallops.  No JVD.  No pedal edema.  Gastrointestinal system: Abdomen is soft, some diffuse tenderness to palpation, hypoactive bowel sounds. No guarding.  No rebound.  No hepatosplenomegaly.  G-tube intact. Central nervous system: Alert and oriented. No focal neurological deficits. Extremities: Symmetric 5 x 5 power. Skin: No rashes, lesions or ulcers Psychiatry: Judgement and insight appear normal. Mood & affect appropriate.     Data Reviewed: I have personally reviewed following labs and imaging studies  CBC: Recent Labs  Lab 09/20/17 0421 09/21/17 0426 09/22/17 0334 09/24/17 0403 09/25/17 0518 09/26/17 0402  WBC 8.9 9.3 9.5 18.7* 22.3* 17.8*  NEUTROABS 6.8  --   --  14.5*  --  14.3*  HGB 13.0 12.3 12.2 14.0 12.1 11.3*  HCT 40.8 39.1 38.1 43.0 37.6 34.5*    MCV 89.3 89.3 88.6 88.1 87.9 86.0  PLT 401* 274 238 296 267 315   Basic Metabolic Panel: Recent Labs  Lab 09/20/17 0421 09/21/17 0426 09/22/17 0334 09/23/17 0444 09/24/17 0403 09/25/17 0518 09/26/17 0402  NA 145 141 140 139 136 136 135  K 2.8* 3.5 3.5 4.2 4.7 4.8 4.4  CL 114* 113* 111 108 107 105 102  CO2 24 24 24 25 26 25 26   GLUCOSE 152* 139* 131* 130* 156* 140* 137*  BUN 15 14 16 19  25* 34* 33*  CREATININE 0.65 0.56 0.58 0.70 0.78 0.86 0.75  CALCIUM 8.0* 7.8* 7.6* 8.1* 7.7* 7.7* 7.8*  MG 2.1 2.3 2.1 2.3  --  2.1  --   PHOS 2.2* 2.4* 3.0 2.8  --  3.5  --    GFR: Estimated Creatinine Clearance: 62.8 mL/min (by C-G formula based on SCr of 0.75 mg/dL). Liver Function Tests: Recent Labs  Lab 09/20/17 0421 09/21/17 0426 09/23/17 0444  AST 24 30 37  ALT 21 26 29   ALKPHOS 79 77 116  BILITOT 0.9 0.8 0.8  PROT 5.9* 5.6* 5.9*  ALBUMIN 2.4* 2.2* 2.1*   No results for input(s): LIPASE, AMYLASE in the last 168 hours. No results for input(s):  AMMONIA in the last 168 hours. Coagulation Profile: No results for input(s): INR, PROTIME in the last 168 hours. Cardiac Enzymes: No results for input(s): CKTOTAL, CKMB, CKMBINDEX, TROPONINI in the last 168 hours. BNP (last 3 results) No results for input(s): PROBNP in the last 8760 hours. HbA1C: No results for input(s): HGBA1C in the last 72 hours. CBG: Recent Labs  Lab 09/25/17 1204 09/25/17 1711 09/26/17 0001 09/26/17 0618 09/26/17 1237  GLUCAP 111* 125* 122* 126* 134*   Lipid Profile: No results for input(s): CHOL, HDL, LDLCALC, TRIG, CHOLHDL, LDLDIRECT in the last 72 hours. Thyroid Function Tests: No results for input(s): TSH, T4TOTAL, FREET4, T3FREE, THYROIDAB in the last 72 hours. Anemia Panel: No results for input(s): VITAMINB12, FOLATE, FERRITIN, TIBC, IRON, RETICCTPCT in the last 72 hours. Sepsis Labs: No results for input(s): PROCALCITON, LATICACIDVEN in the last 168 hours.  Recent Results (from the past 240  hour(s))  Surgical PCR screen     Status: None   Collection Time: 09/23/17 12:03 AM  Result Value Ref Range Status   MRSA, PCR NEGATIVE NEGATIVE Final   Staphylococcus aureus NEGATIVE NEGATIVE Final    Comment: (NOTE) The Xpert SA Assay (FDA approved for NASAL specimens in patients 30 years of age and older), is one component of a comprehensive surveillance program. It is not intended to diagnose infection nor to guide or monitor treatment.   Culture, Urine     Status: None   Collection Time: 09/25/17 11:30 AM  Result Value Ref Range Status   Specimen Description URINE, RANDOM  Final   Special Requests NONE  Final   Culture   Final    NO GROWTH Performed at [facility], [address] [place], [place] [address]    Report Status 09/26/2017 FINAL  Final         Radiology Studies: Dg Chest Port 1 View  Result Date: 09/25/2017 CLINICAL DATA:  Leukocytosis and status post laparotomy and bowel bypass for colon carcinoma. EXAM: PORTABLE CHEST 1 VIEW COMPARISON:  08/30/2017 FINDINGS: Right arm PICC line extends to the distal SVC. The heart size and mediastinal contours are within normal limits. Lung volumes are low with bibasilar atelectasis present. There may be small bilateral pleural effusions. There is no evidence of pulmonary edema, consolidation, pneumothorax or nodules. The visualized skeletal structures are unremarkable. IMPRESSION: Low lung volumes with bibasilar atelectasis. Possible small bilateral pleural effusions. Electronically Signed   By: Aletta Edouard M.D.   On: 09/25/2017 10:39        Scheduled Meds: . enoxaparin (LOVENOX) injection  40 mg Subcutaneous Q24H  . feeding supplement  1 Container Oral TID BM  . insulin aspart  0-9 Units Subcutaneous Q6H  . morphine   Intravenous Q4H  . phenytoin (DILANTIN) IV  100 mg Intravenous QHS   Continuous Infusions: . Marland KitchenTPN (CLINIMIX-E) Adult 75 mL/hr at 09/25/17 1802  . Marland KitchenTPN (CLINIMIX-E) Adult     And  . fat  emulsion    . sodium chloride 10 mL/hr at 09/25/17 1214  . piperacillin-tazobactam (ZOSYN)  IV Stopped (09/26/17 0904)     LOS: 11 days    Time spent: 35 minutes    Irine Seal, MD [facility] Pager [phone]  If 7PM-7AM, please contact night-coverage [email or web address] Password [facility] 09/26/2017, 12:42 PM

## 2017-09-26 NOTE — Plan of Care (Signed)
Cont to mon 

## 2017-09-26 NOTE — Progress Notes (Signed)
PHARMACY - ADULT TOTAL PARENTERAL NUTRITION CONSULT NOTE   Pharmacy Consult for TPN Indication: bowel obstruction  Patient Measurements: Height: 5\' 2"  (157.5 cm) Weight: 139 lb (63 kg) IBW/kg (Calculated) : 50.1 TPN AdjBW (KG): 53.3 Body mass index is 25.42 kg/m. Usual Weight: 68 kg  Insulin Requirements: 4 units sensitive SSI / 24 hrs  Current Nutrition:  - Clear Liquid diet started 1/11 - Boost TID between meals  IVF: NS at 10 ml/hr  Central access: PICC placed 1/6 TPN start date: 1/6  ASSESSMENT                                                                                                          HPI: Pt with PMH of seizures and recently diagnosed peritoneal carcinomatosis with bowel obstruction/colonic obstruction secondary to malignancy of unknown primary presents with N/V, admitted for further management/evaluation. CT abdomen/pelviswith contrast shows progressing SBO. Having BMs but with 1450 ml from NGT, Surgery recommends starting TPN. Patient with significant recent weight loss but primarily d/t multiple large-volume paracentesis. Poor intake since week before Xmas, and unable to keep anything down since 12/31. Appears to be at risk for refeeding.  Significant events:  1/9 endoscopy/colonoscopy showed mass in cecum 1/10:  Exp lap with palliative small bowel to right colon bypass with placement of gastrostomy  Recent Labs    09/25/17 0518 09/26/17 0402  NA 136 135  K 4.8 4.4  CL 105 102  CO2 25 26  GLUCOSE 140* 137*  BUN 34* 33*  CREATININE 0.86 0.75  CALCIUM 7.7* 7.8*  PHOS 3.5  --   MG 2.1  --   Corr Ca  9.3 (09/26/17)  Today:   Glucose (goal <150):  111 - 126  Electrolytes: Na, K, CorrCa WNL.      Renal: SCr wnl  LFTs: slightly elevated on admit, now below ULN (1/10)  TGs: slightly elevated at 180 (1/7)  Prealbumin: 8.4 (1.7)  NUTRITIONAL GOALS                                                                                             RD  recs (09/20/17):  Kcal:  1700-1900 Protein:  80-90 grams Fluid:  >/= 1.7 L/d  Clinimix-E 5/15 at a goal rate of 75 ml/hr + 20% fat emulsion 240 ml / day to provide: 90 g/day protein, 1758 Kcal/day.  PLAN  At 1800 today:  Continue Clinimix E 5/15 at goal rate of 75 ml/hr  20% fat emulsion at 20 ml/hr x 12 hr  TPN to contain standard multivitamins and trace elements  IVF at [place] sensitive SSI with q6h CBG checks  TPN lab panels on Mondays & Thursdays  F/u daily  Clayburn Pert, PharmD, BCPS Pager: [phone] 09/26/2017  8:07 AM

## 2017-09-26 NOTE — Progress Notes (Signed)
Encouraged and educated pt on IS, Pt wont use it because of pain to surgical site when taking deep breathes. Encouraged to use PCA. Pt used PCA pump and used IS. Will cont to encourage

## 2017-09-27 ENCOUNTER — Telehealth: Payer: Self-pay

## 2017-09-27 LAB — COMPREHENSIVE METABOLIC PANEL
ALT: 116 U/L — ABNORMAL HIGH (ref 14–54)
AST: 136 U/L — ABNORMAL HIGH (ref 15–41)
Albumin: 1.4 g/dL — ABNORMAL LOW (ref 3.5–5.0)
Alkaline Phosphatase: 170 U/L — ABNORMAL HIGH (ref 38–126)
Anion gap: 8 (ref 5–15)
BUN: 35 mg/dL — ABNORMAL HIGH (ref 6–20)
CHLORIDE: 103 mmol/L (ref 101–111)
CO2: 24 mmol/L (ref 22–32)
CREATININE: 0.94 mg/dL (ref 0.44–1.00)
Calcium: 7.8 mg/dL — ABNORMAL LOW (ref 8.9–10.3)
Glucose, Bld: 134 mg/dL — ABNORMAL HIGH (ref 65–99)
POTASSIUM: 4.6 mmol/L (ref 3.5–5.1)
SODIUM: 135 mmol/L (ref 135–145)
Total Bilirubin: 1.8 mg/dL — ABNORMAL HIGH (ref 0.3–1.2)
Total Protein: 5.1 g/dL — ABNORMAL LOW (ref 6.5–8.1)

## 2017-09-27 LAB — DIFFERENTIAL
BASOS PCT: 0 %
Basophils Absolute: 0 10*3/uL (ref 0.0–0.1)
EOS ABS: 0 10*3/uL (ref 0.0–0.7)
Eosinophils Relative: 0 %
LYMPHS ABS: 1.4 10*3/uL (ref 0.7–4.0)
Lymphocytes Relative: 9 %
MONOS PCT: 9 %
Monocytes Absolute: 1.5 10*3/uL — ABNORMAL HIGH (ref 0.1–1.0)
NEUTROS ABS: 13.4 10*3/uL — AB (ref 1.7–7.7)
NEUTROS PCT: 82 %

## 2017-09-27 LAB — TRIGLYCERIDES: Triglycerides: 465 mg/dL — ABNORMAL HIGH (ref <150)

## 2017-09-27 LAB — CBC
HEMATOCRIT: 33.5 % — AB (ref 36.0–46.0)
HEMOGLOBIN: 11.1 g/dL — AB (ref 12.0–15.0)
MCH: 28.4 pg (ref 26.0–34.0)
MCHC: 33.1 g/dL (ref 30.0–36.0)
MCV: 85.7 fL (ref 78.0–100.0)
Platelets: 343 10*3/uL (ref 150–400)
RBC: 3.91 MIL/uL (ref 3.87–5.11)
RDW: 15.5 % (ref 11.5–15.5)
WBC: 16.3 10*3/uL — AB (ref 4.0–10.5)

## 2017-09-27 LAB — TSH: TSH: 4.372 u[IU]/mL (ref 0.350–4.500)

## 2017-09-27 LAB — PREALBUMIN: PREALBUMIN: 6.1 mg/dL — AB (ref 18–38)

## 2017-09-27 LAB — MAGNESIUM: MAGNESIUM: 2.3 mg/dL (ref 1.7–2.4)

## 2017-09-27 LAB — PHOSPHORUS: PHOSPHORUS: 5.1 mg/dL — AB (ref 2.5–4.6)

## 2017-09-27 LAB — GLUCOSE, CAPILLARY
GLUCOSE-CAPILLARY: 131 mg/dL — AB (ref 65–99)
Glucose-Capillary: 137 mg/dL — ABNORMAL HIGH (ref 65–99)

## 2017-09-27 MED ORDER — TRACE MINERALS CR-CU-MN-SE-ZN 10-1000-500-60 MCG/ML IV SOLN
INTRAVENOUS | Status: DC
  Administered 2017-09-27: 18:00:00 via INTRAVENOUS
  Filled 2017-09-27: qty 1800

## 2017-09-27 MED ORDER — FUROSEMIDE 10 MG/ML IJ SOLN
40.0000 mg | Freq: Once | INTRAMUSCULAR | Status: AC
  Administered 2017-09-27: 40 mg via INTRAVENOUS
  Filled 2017-09-27: qty 4

## 2017-09-27 NOTE — Progress Notes (Signed)
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 Patient reports poor oral intake and unable to eat solid foods for past few weeks but 24 hours prior to this admission, she felt a lot worse, bloated, uncomfortable. Off note patient had appointment with gastroenterologist scheduled for 09/15/2017 due to cytology results concerning for adenocarcinoma.    Assessment & Plan:   Principal Problem:   SBO (small bowel obstruction) (HCC) Active Problems:   Colonic mass   Ascites   Nausea & vomiting   Peritoneal carcinomatosis (HCC)   History of seizures   Nausea and vomiting   Dehydration   Neoplasm of colon, malignant (HCC)   Leukocytosis   Peritoneal Carcinomatosis  Ascites  Nausea and vomiting  Small Bowel Obstruction/cecal mass:  Pt was following with Dr. 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  - repeat x ray 1/8 with persistent partial SBO - surgery c/s - TPN for nutrition support.  Patient underwent upper endoscopy 09/22/2017, which was unremarkable, colonoscopy was concerning for cecal mass.  Patient has been seen in consultation by general surgery and patient status post exploratory laparotomy with palliative small bowel to right  colon bypass with placement of gastrostomy tube per Dr. Marlou Starks 09/23/2017.  Tolerating clear liquids.  General surgery following.  Oncology following.  Continue antiemetics.  Supportive care.   Hypokalemia  Hypophosphatemia: Potassium repleted.  Phosphorus levels elevated.  Per pharmacy.    Anion gap metabolic acidosis:improved  L ureteral obstruction s/p stent placement: Creatinine stable.  Outpatient follow-up.    Abnormal UA: Initial urine cultures were negative.  Patient now with a worsening leukocytosis.  A repeat UA nitrite negative, trace leukocytes.  Too numerous to count WBCs.  Urine culture is negative.    Leukocytosis: Worsened postop.  ChestX-ray negative for acute infiltrate.  Urinalysis cloudy, trace leukocytes, nitrite negative, too numerous to count WBCs, clumps present.  Urine culture negative.  WBC trending down.  Continue empiric IV Zosyn.  Continue incentive spirometry.   Mildly elevated liver enzymes: Enzymes much elevated today.  Concern as to whether secondary to TNA.  Patient also with some lower extremity edema.  We will give a dose of Lasix 40 mg IV x1 and follow.    Mildly elevated lipase: imaging no signs of pancreatitis.  Patient clinically with no signs of pancreatitis.  Monitor for now.    History of seizures:has had 1 seizure in 1980's, none since.  No seizures noted during this hospitalization.  Continue IV Dilantin for now.  Transition to oral Dilantin when patient tolerating a diet.         DVT prophylaxis: Lovenox Code Status: Full Family Communication: Updated patient. No family at bedside.  Disposition Plan: May need skilled nursing facility.  Per general surgery.   Consultants:   Otology/oncology Dr. Benay Spice [phone]  Gastroenterology Dr.  Edwards 09/15/2017  General surgery Dr. Harlow Asa 09/15/2017  GYN oncology  Procedures:   Endoscopy per Dr. Cristina Gong 09/22/2016  Colonoscopy per Dr. Cristina Gong 09/22/2017  CT abdomen and pelvis  09/14/2017  CT-guided core biopsy of peritoneal tumor per Dr. Kathlene Cote 09/17/2017  Abdominal x-ray 09/15/2017, 10/01/2017  Acute abdominal series 09/14/2017  Exploratory laparotomy with palliative small bowel to right colon bypass with placement of gastrostomy tube per Dr. Marlou Starks 09/23/2017  Chest x-ray 09/25/2017  Antimicrobials:   IV Zosyn 09/25/2017     Subjective: Patient sitting up in chair.  Patient tolerating clear liquids.  Still some abdominal discomfort.  No flatus.  No bowel movements.  Occasional burping.    Objective: Vitals:   09/27/17 0001 09/27/17 0343 09/27/17 0500 09/27/17 1003  BP:   119/82 110/88  Pulse:   93 (!) 114  Resp: (!) 28 (!) 29 (!) 24 (!) 22  Temp:   97.9 F (36.6 C) 97.7 F (36.5 C)  TempSrc:   Oral Oral  SpO2: 94% 94% 96% 93%  Weight:      Height:        Intake/Output Summary (Last 24 hours) at 09/27/2017 1135 Last data filed at 09/27/2017 1009 Gross per 24 hour  Intake 2994.49 ml  Output 2500 ml  Net 494.49 ml   Filed Weights   09/14/17 1127 09/14/17 2110 09/22/17 1217  Weight: 68 kg (150 lb) 63.4 kg (139 lb 12.8 oz) 63 kg (139 lb)    Examination:  General exam: NAD Respiratory system: Clear to auscultation bilaterally anterior lung fields..  No crackles, no wheezes, no rhonchi.  Fair air movement.   Respiratory effort normal. Cardiovascular system: Regular rate rhythm no murmurs rubs or gallops.  2+ lower extremity edema. No JVD.  Gastrointestinal system: Abdomen is soft, some diffuse tenderness to palpation, hypoactive bowel sounds. No guarding.  No rebound.  No hepatosplenomegaly.  G-tube intact and draining. Central nervous system: Alert and oriented. No focal neurological deficits. Extremities: Symmetric 5 x 5 power. Skin: No rashes, lesions or ulcers Psychiatry: Judgement and insight appear normal. Mood & affect appropriate.     Data Reviewed: I have personally reviewed following labs and imaging studies  CBC: Recent Labs  Lab  09/22/17 0334 09/24/17 0403 09/25/17 0518 09/26/17 0402 09/27/17 0449  WBC 9.5 18.7* 22.3* 17.8* 16.3*  NEUTROABS  --  14.5*  --  14.3* 13.4*  HGB 12.2 14.0 12.1 11.3* 11.1*  HCT 38.1 43.0 37.6 34.5* 33.5*  MCV 88.6 88.1 87.9 86.0 85.7  PLT 238 296 267 316 086   Basic Metabolic Panel: Recent Labs  Lab 09/21/17 0426 09/22/17 0334 09/23/17 0444 09/24/17 0403 09/25/17 0518 09/26/17 0402 09/27/17 0449  NA 141 140 139 136 136 135 135  K 3.5 3.5 4.2 4.7 4.8 4.4 4.6  CL 113* 111 108 107 105 102 103  CO2 24 24 25 26 25 26 24   GLUCOSE 139* 131* 130* 156* 140* 137* 134*  BUN 14 16 19  25* 34* 33* 35*  CREATININE 0.56 0.58 0.70 0.78 0.86 0.75 0.94  CALCIUM 7.8* 7.6* 8.1* 7.7* 7.7* 7.8* 7.8*  MG 2.3 2.1 2.3  --  2.1  --  2.3  PHOS 2.4* 3.0 2.8  --  3.5  --  5.1*   GFR: Estimated Creatinine Clearance: 53.5 mL/min (by C-G formula based on SCr of 0.94 mg/dL). Liver Function Tests: Recent Labs  Lab 09/21/17 0426 09/23/17 0444 09/27/17 0449  AST 30 37 136*  ALT 26 29 116*  ALKPHOS  77 116 170*  BILITOT 0.8 0.8 1.8*  PROT 5.6* 5.9* 5.1*  ALBUMIN 2.2* 2.1* 1.4*   No results for input(s): LIPASE, AMYLASE in the last 168 hours. No results for input(s): AMMONIA in the last 168 hours. Coagulation Profile: No results for input(s): INR, PROTIME in the last 168 hours. Cardiac Enzymes: No results for input(s): CKTOTAL, CKMB, CKMBINDEX, TROPONINI in the last 168 hours. BNP (last 3 results) No results for input(s): PROBNP in the last 8760 hours. HbA1C: No results for input(s): HGBA1C in the last 72 hours. CBG: Recent Labs  Lab 09/26/17 0618 09/26/17 1237 09/26/17 1727 09/26/17 2305 09/27/17 0639  GLUCAP 126* 134* 134* 131* 137*   Lipid Profile: Recent Labs    09/27/17 0449  TRIG 465*   Thyroid Function Tests: Recent Labs    09/27/17 0449  TSH 4.372   Anemia Panel: No results for input(s): VITAMINB12, FOLATE, FERRITIN, TIBC, IRON, RETICCTPCT in the last 72  hours. Sepsis Labs: No results for input(s): PROCALCITON, LATICACIDVEN in the last 168 hours.  Recent Results (from the past 240 hour(s))  Surgical PCR screen     Status: None   Collection Time: 09/23/17 12:03 AM  Result Value Ref Range Status   MRSA, PCR NEGATIVE NEGATIVE Final   Staphylococcus aureus NEGATIVE NEGATIVE Final    Comment: (NOTE) The Xpert SA Assay (FDA approved for NASAL specimens in patients 78 years of age and older), is one component of a comprehensive surveillance program. It is not intended to diagnose infection nor to guide or monitor treatment.   Culture, Urine     Status: None   Collection Time: 09/25/17 11:30 AM  Result Value Ref Range Status   Specimen Description URINE, RANDOM  Final   Special Requests NONE  Final   Culture   Final    NO GROWTH Performed at [facility], [address], [place], [place] [address]    Report Status 09/26/2017 FINAL  Final         Radiology Studies: No results found.      Scheduled Meds: . enoxaparin (LOVENOX) injection  40 mg Subcutaneous Q24H  . feeding supplement  1 Container Oral TID BM  . morphine   Intravenous Q4H  . phenytoin (DILANTIN) IV  100 mg Intravenous QHS   Continuous Infusions: . Marland KitchenTPN (CLINIMIX-E) Adult 75 mL/hr at 09/26/17 1844  . Marland KitchenTPN (CLINIMIX-E) Adult    . sodium chloride 10 mL/hr at 09/25/17 1214  . piperacillin-tazobactam (ZOSYN)  IV Stopped (09/27/17 1962)     LOS: 12 days    Time spent: 35 minutes    Irine Seal, MD [facility] Pager [phone]  If 7PM-7AM, please contact night-coverage [email or web address] Password [facility] 09/27/2017, 11:35 AM

## 2017-09-27 NOTE — Progress Notes (Signed)
PT Cancellation Note  Patient Details Name: Christie Maynard MRN: [id number] DOB: 08/05/1956   Cancelled Treatment:    Reason Eval/Treat Not Completed: Fatigue/lethargy limiting ability to participate; attempted twice to see pt today.  Had been fatigued from working with OT, using restroom, getting washed up.  Will attempt again another day.   Reginia Naas 09/27/2017, 3:16 PM Magda Kiel, [place] 09/27/2017

## 2017-09-27 NOTE — Evaluation (Signed)
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 Patient reports poor oral intake and unable to eat solid foods for past few weeks but 24 hours prior to this admission, she felt a lot worse, bloated, uncomfortable. /p EXPLORATORY LAPAROTOMY WITH PALLIATIVE SMALL BOWEL TO RIGHT COLON BY PASS WITH PLACEMENT OF GASTROSTOMY TUBE 1/10    Clinical Impression   Pt admitted with SBO. Pt currently with functional limitations due to the deficits listed below (see OT Problem List).  Pt will benefit from skilled OT to increase their safety and independence with ADL and functional mobility for ADL to facilitate discharge to venue listed below.    Follow Up Recommendations  Home health OT;Supervision/Assistance - 24 hour    Equipment Recommendations  3 in 1 bedside commode    Recommendations for Other Services       Precautions / Restrictions Precautions Precautions: Fall Precaution Comments: multiple lines      Mobility Bed Mobility               General bed mobility comments: pt in chair  Transfers Overall transfer level: Needs assistance Equipment used: Rolling walker (2 wheeled) Transfers: Sit to/from Stand Sit to Stand: Min assist         General transfer comment: pt needed increased time    Balance Overall balance assessment: No apparent balance deficits (not formally assessed)(denies falls)                                         ADL either performed or assessed with clinical judgement   ADL                Pt needs increased time and overall mod A with ADL activity. Will  further assess ADL's next OT session.                                Vision Patient Visual Report: No change from baseline              Pertinent Vitals/Pain Pain Assessment: No/denies pain     Hand Dominance     Extremity/Trunk Assessment Upper Extremity Assessment Upper Extremity Assessment: Generalized weakness           Communication Communication Communication: No difficulties   Cognition Arousal/Alertness: Awake/alert Behavior During Therapy: WFL for tasks assessed/performed Overall Cognitive Status: Within Functional Limits for tasks assessed                                     General Comments   son and daughter in law will provide A at home upon [place] expects to be discharged to:: Private residence Living Arrangements: Spouse/significant other   Type of Home: House Home Access: Stairs to enter [employer] of Steps: 4   Home Layout: One level     Bathroom Shower/Tub: Tub/shower unit  Home Equipment: None          Prior Functioning/Environment Level of Independence: Independent                 OT Problem List: Decreased strength;Decreased activity tolerance;Decreased knowledge of use of DME or AE;Cardiopulmonary status limiting activity      OT Treatment/Interventions: Self-care/ADL training;Patient/family education;DME and/or AE instruction    OT Goals(Current goals can be found in the care plan section) Acute Rehab OT Goals Patient Stated Goal: home with A OT Goal Formulation: With patient Time For Goal Achievement: 10/11/17 Potential to Achieve Goals: Good ADL Goals Pt Will Perform Grooming: with modified independence;standing Pt Will Perform Lower Body Bathing: with supervision;sit to/from stand;with caregiver independent in assisting Pt Will Perform Upper Body Dressing: with set-up;sitting Pt Will Perform Lower Body Dressing: sit to/from  stand;with supervision;with caregiver independent in assisting Pt Will Transfer to Toilet: with modified independence;ambulating;regular height toilet Pt Will Perform Toileting - Clothing Manipulation and hygiene: with min guard assist;sit to/from stand  OT Frequency: Min 2X/week   Barriers to D/C:            Co-evaluation              AM-PAC PT "6 Clicks" Daily Activity     Outcome Measure Help from another person eating meals?: None Help from another person taking care of personal grooming?: A Little Help from another person toileting, which includes using toliet, bedpan, or urinal?: A Lot Help from another person bathing (including washing, rinsing, drying)?: A Lot Help from another person to put on and taking off regular upper body clothing?: A Little Help from another person to put on and taking off regular lower body clothing?: A Lot 6 Click Score: 16   End of Session Equipment Utilized During Treatment: Rolling walker Nurse Communication: Mobility status  Activity Tolerance: Patient tolerated treatment well Patient left: in chair;with call bell/phone within reach  OT Visit Diagnosis: Muscle weakness (generalized) (M62.81)                Time: 3354-5625 OT Time Calculation (min): 16 min Charges:  OT General Charges $OT Visit: 1 Visit OT Evaluation $OT Eval Moderate Complexity: 1 Mod G-Codes:     Kari Baars, OT [phone]  Payton Mccallum D 09/27/2017, 1:16 PM

## 2017-09-27 NOTE — Progress Notes (Signed)
PHARMACY - ADULT TOTAL PARENTERAL NUTRITION CONSULT NOTE   Pharmacy Consult for TPN Indication: bowel obstruction  Patient Measurements: Height: 5' 2"  (157.5 cm) Weight: 139 lb (63 kg) IBW/kg (Calculated) : 50.1 TPN AdjBW (KG): 53.3 Body mass index is 25.42 kg/m. Usual Weight: 68 kg  Insulin Requirements: 3 units sensitive SSI / 24 hrs  Current Nutrition:  - Clear Liquid diet started 1/11 - Boost TID between meals  IVF: NS at 100 ml/hr  Central access: PICC placed 1/6 TPN start date: 1/6  ASSESSMENT                                                                                                          HPI: Pt with PMH of seizures and recently diagnosed peritoneal carcinomatosis with bowel obstruction/colonic obstruction secondary to malignancy of unknown primary presents with N/V, admitted for further management/evaluation. CT abdomen/pelviswith contrast shows progressing SBO. Having BMs but with 1450 ml from NGT, Surgery recommends starting TPN. Patient with significant recent weight loss but primarily d/t multiple large-volume paracentesis. Poor intake since week before Xmas, and unable to keep anything down since 12/31. Appears to be at risk for refeeding.  Significant events:  1/9 endoscopy/colonoscopy showed mass in cecum 1/10:  Exp lap with palliative small bowel to right colon bypass with placement of gastrostomy 1/14 continue g tube to drain and allow to drink for comfort, tolerating clear liquids  Recent Labs    09/25/17 0518 09/26/17 0402 09/27/17 0449  NA 136 135 135  K 4.8 4.4 4.6  CL 105 102 103  CO2 25 26 24   GLUCOSE 140* 137* 134*  BUN 34* 33* 35*  CREATININE 0.86 0.75 0.94  CALCIUM 7.7* 7.8* 7.8*  PHOS 3.5  --  5.1*  MG 2.1  --  2.3  ALBUMIN  --   --  1.4*  ALKPHOS  --   --  170*  AST  --   --  136*  ALT  --   --  116*  BILITOT  --   --  1.8*  TRIG  --   --  465*  Corr Ca  9.3 (09/26/17)  Today:   Glucose (goal <150): will DC SSI/CBGs  1/14  Electrolytes: WNL x phos slightly elevated at 5.1, CoCa 9.88 (CaxPhos 50) K 4.6, mag 2.3  Renal: SCr wnl, drains out 1500 ml, 1 BM recorded 1/13  LFTs: slightly elevated on admit, now rising, AST/ALT 136/116, alk phos 170, t bili elevated at 1.8  TGs: elevated 180 (1/7) 465 (1/14)  Prealbumin: 8.4 (1.7)  NUTRITIONAL GOALS  RD recs (09/20/17):  Kcal:  1700-1900 Protein:  80-90 grams Fluid:  >/= 1.7 L/d  Clinimix-E 5/15 at a goal rate of 75 ml/hr + 20% fat emulsion 240 ml / day to provide: 90 g/day protein, 1758 Kcal/day.  PLAN                                                                                                                          At 1800 today:  Continue Clinimix E 5/15 at goal rate of 75 ml/hr  Stop 20% fat emulsion 2nd Trig > 400 (465 1/14)   This provides 1278 kcal and 90 gm protein for 75% kcal and 100% protein needs  TPN to contain standard multivitamins and trace elements  IVF at 100 ml/hr x 1 day per MD  DC sensitive SSI and DC q6h CBG checks  TPN lab panels on Mondays & Thursdays  Repeat BMET, mag/phos in am  F/u daily  Eudelia Bunch, Pharm.D. [phone] 09/27/2017 7:57 AM

## 2017-09-27 NOTE — Telephone Encounter (Signed)
Spoke with Faythe Dingwall in pathology to send peritoneal biopsy specimen for Foundation 1 testing.

## 2017-09-27 NOTE — Progress Notes (Signed)
[facility] Progress Note  4 Days Post-Op  Subjective: CC-  Up in chair. No complaints. Pain well controlled with PCA. Tolerating clear liquids, G-tube to gravity. No BM or flatus.  Objective: Vital signs in last 24 hours: Temp:  [97.7 F (36.5 C)-97.9 F (36.6 C)] 97.7 F (36.5 C) (01/14 1003) Pulse Rate:  [90-114] 114 (01/14 1003) Resp:  [16-29] 22 (01/14 1003) BP: (110-119)/(72-88) 110/88 (01/14 1003) SpO2:  [93 %-96 %] 93 % (01/14 1003) Last BM Date: 09/26/17  Intake/Output from previous day: 01/13 0701 - 01/14 0700 In: 3150.7 [P.O.:780; I.V.:2220.7; IV Piggyback:150] Out: 2500 [Urine:1000; Drains:1500] Intake/Output this shift: Total I/O In: 120 [P.O.:120] Out: -   PE: Gen:  Alert, NAD, pleasant HEENT: EOM's intact, pupils equal and round Card:  RRR, no M/G/R heard Pulm:  effort normal Abd: Soft, mild distension, few BS heard, mild global tenderness without rebound or guarding, midline incision C/D/I with honeycomb dressing in place, G-tube to gravity Psych: A&Ox3  Skin: no rashes noted, warm and dry  Lab Results:  Recent Labs    09/26/17 0402 09/27/17 0449  WBC 17.8* 16.3*  HGB 11.3* 11.1*  HCT 34.5* 33.5*  PLT 316 343   BMET Recent Labs    09/26/17 0402 09/27/17 0449  NA 135 135  K 4.4 4.6  CL 102 103  CO2 26 24  GLUCOSE 137* 134*  BUN 33* 35*  CREATININE 0.75 0.94  CALCIUM 7.8* 7.8*   PT/INR No results for input(s): LABPROT, INR in the last 72 hours. CMP     Component Value Date/Time   NA 135 09/27/2017 0449   K 4.6 09/27/2017 0449   CL 103 09/27/2017 0449   CO2 24 09/27/2017 0449   GLUCOSE 134 (H) 09/27/2017 0449   BUN 35 (H) 09/27/2017 0449   CREATININE 0.94 09/27/2017 0449   CALCIUM 7.8 (L) 09/27/2017 0449   PROT 5.1 (L) 09/27/2017 0449   ALBUMIN 1.4 (L) 09/27/2017 0449   AST 136 (H) 09/27/2017 0449   ALT 116 (H) 09/27/2017 0449   ALKPHOS 170 (H) 09/27/2017 0449   BILITOT 1.8 (H) 09/27/2017 0449   GFRNONAA >60  09/27/2017 0449   GFRAA >60 09/27/2017 0449   Lipase     Component Value Date/Time   LIPASE 69 (H) 09/14/2017 1149       Studies/Results: No results found.  Anti-infectives: Anti-infectives (From admission, onward)   Start     Dose/Rate Route Frequency Ordered Stop   09/25/17 1200  piperacillin-tazobactam (ZOSYN) IVPB 3.375 g     3.375 g 12.5 mL/hr over 240 Minutes Intravenous Every 8 hours 09/25/17 1054     09/23/17 1249  cefoTEtan in Dextrose 5% (CEFOTAN) 2-2.08 GM-%(50ML) IVPB    Comments:  [name]   : cabinet override      09/23/17 1249 09/24/17 0059   09/23/17 1100  cefoTEtan (CEFOTAN) 2 g in dextrose 5 % 50 mL IVPB     2 g 100 mL/hr over 30 Minutes Intravenous  Once 09/22/17 1549 09/23/17 1333       Assessment/Plan SBO/cecal mass Metastatic colon cancer, carcinomatosis S/p EXPLORATORY LAPAROTOMY WITH PALLIATIVE SMALL BOWEL TO RIGHT COLON BY PASS WITH PLACEMENT OF GASTROSTOMY TUBE 1/10 Dr. Marlou Starks - POD 4 - G tube to gravity, clear liquids for comfort - WBC 16.3 slightly down from yesterday 17.8, afebrile. CXR 1/12 and Ucx 1/12 negative. Continue zosyn and monitor WBC - Ambulate/IS - honeycomb dressing can be removed tomorrow  ID - zosyn 1/12>>, cefotetan  1/10>>1/11 FEN - TPN, clear liquids, Boost VTE - lovenox Foley - none Follow up - Dr. Marlou Starks   LOS: 12 days    Wellington Hampshire , [facility] 09/27/2017, 11:27 AM Pager: [phone] Consults: [phone] Mon-Fri 7:00 am-4:30 pm Sat-Sun 7:00 am-11:30 am

## 2017-09-28 LAB — HEPATIC FUNCTION PANEL
ALBUMIN: 1.3 g/dL — AB (ref 3.5–5.0)
ALK PHOS: 174 U/L — AB (ref 38–126)
ALT: 83 U/L — AB (ref 14–54)
AST: 67 U/L — AB (ref 15–41)
BILIRUBIN TOTAL: 2.5 mg/dL — AB (ref 0.3–1.2)
Bilirubin, Direct: 1.4 mg/dL — ABNORMAL HIGH (ref 0.1–0.5)
Indirect Bilirubin: 1.1 mg/dL — ABNORMAL HIGH (ref 0.3–0.9)
Total Protein: 5.3 g/dL — ABNORMAL LOW (ref 6.5–8.1)

## 2017-09-28 LAB — CBC
HCT: 30.5 % — ABNORMAL LOW (ref 36.0–46.0)
Hemoglobin: 10.4 g/dL — ABNORMAL LOW (ref 12.0–15.0)
MCH: 28.9 pg (ref 26.0–34.0)
MCHC: 34.1 g/dL (ref 30.0–36.0)
MCV: 84.7 fL (ref 78.0–100.0)
PLATELETS: 360 10*3/uL (ref 150–400)
RBC: 3.6 MIL/uL — ABNORMAL LOW (ref 3.87–5.11)
RDW: 15.7 % — AB (ref 11.5–15.5)
WBC: 16.2 10*3/uL — AB (ref 4.0–10.5)

## 2017-09-28 LAB — MAGNESIUM: Magnesium: 2.5 mg/dL — ABNORMAL HIGH (ref 1.7–2.4)

## 2017-09-28 LAB — BASIC METABOLIC PANEL
ANION GAP: 11 (ref 5–15)
BUN: 53 mg/dL — ABNORMAL HIGH (ref 6–20)
CALCIUM: 7.9 mg/dL — AB (ref 8.9–10.3)
CO2: 23 mmol/L (ref 22–32)
Chloride: 100 mmol/L — ABNORMAL LOW (ref 101–111)
Creatinine, Ser: 1.23 mg/dL — ABNORMAL HIGH (ref 0.44–1.00)
GFR, EST AFRICAN AMERICAN: 53 mL/min — AB (ref >60)
GFR, EST NON AFRICAN AMERICAN: 46 mL/min — AB (ref >60)
GLUCOSE: 126 mg/dL — AB (ref 65–99)
Potassium: 4.5 mmol/L (ref 3.5–5.1)
Sodium: 134 mmol/L — ABNORMAL LOW (ref 135–145)

## 2017-09-28 LAB — PHOSPHORUS: PHOSPHORUS: 6.2 mg/dL — AB (ref 2.5–4.6)

## 2017-09-28 MED ORDER — MORPHINE SULFATE (CONCENTRATE) 10 MG/0.5ML PO SOLN: 5.0000 mg | ORAL | Status: DC | PRN

## 2017-09-28 MED ORDER — FUROSEMIDE 10 MG/ML IJ SOLN
20.0000 mg | Freq: Once | INTRAMUSCULAR | Status: AC
  Administered 2017-09-28: 20 mg via INTRAVENOUS
  Filled 2017-09-28: qty 2

## 2017-09-28 MED ORDER — TRACE MINERALS CR-CU-MN-SE-ZN 10-1000-500-60 MCG/ML IV SOLN
INTRAVENOUS | Status: DC
  Filled 2017-09-28: qty 1800

## 2017-09-28 MED ORDER — ALTEPLASE 2 MG IJ SOLR
2.0000 mg | Freq: Once | INTRAMUSCULAR | Status: AC
  Administered 2017-09-28: 2 mg
  Filled 2017-09-28: qty 2

## 2017-09-28 MED ORDER — TRACE MINERALS CR-CU-MN-SE-ZN 10-1000-500-60 MCG/ML IV SOLN: INTRAVENOUS | Status: DC

## 2017-09-28 MED ORDER — TRACE MINERALS CR-CU-MN-SE-ZN 10-1000-500-60 MCG/ML IV SOLN
INTRAVENOUS | Status: AC
  Administered 2017-09-28: 17:00:00 via INTRAVENOUS
  Filled 2017-09-28: qty 960

## 2017-09-28 MED ORDER — MORPHINE SULFATE (CONCENTRATE) 10 MG/0.5ML PO SOLN
5.0000 mg | ORAL | Status: DC | PRN
  Administered 2017-09-29 – 2017-09-30 (×4): 10 mg via SUBLINGUAL
  Filled 2017-09-28 (×4): qty 0.5

## 2017-09-28 NOTE — Progress Notes (Signed)
Nutrition Follow-up  DOCUMENTATION CODES:   Not applicable  INTERVENTION:    TPN per pharmacy  Boost Breeze po TID, each supplement provides 250 kcal and 9 grams of protein  Monitor for goals of care  NUTRITION DIAGNOSIS:   Increased nutrient needs related to chronic illness, cancer and cancer related treatments, catabolic illness as evidenced by estimated needs.  Ongoing  GOAL:   Patient will meet greater than or equal to 90% of their needs  Meeting with TPN  MONITOR:   PO intake, Supplement acceptance, Weight trends, I & O's, Labs  REASON FOR ASSESSMENT:   Malnutrition Screening Tool    ASSESSMENT:   Pt with PMH of seizures and recently diagnosed peritoneal carcinomatosis with bowel obstruction/colonic obstruction secondary to malignancy of unknown primary presents with N/V, admitted for further management/evaluation.   1/9- endo/colonoscopy showed cecal mass 1/10- exploratory laparotomy with palliative small bowel to right colon bypass, placement of G-tube 1/11- advanced to clears for comfort  Pt discussed in rounds. LFTs show to be elevated along with triglycerides. Spoke with pharmacy, plan to decrease Clinimix 5/15 to 40 ml/hr and d/c ILE. This provides pt with 682 calories and 48 g protein. Meets 40% of calorie needs and 60% of protein needs. Pt drinking 1 boost breeze per day, but continues with G-tube to gravity.  She consumes tea and grape juice daily.   Surgery notes: GI tract not functioning at this time. GI function may never return due to tumor burden, malignant SBO, with nothing left to do surgically. TPN is not indicated in this situation but has not been addressed with family.   No recent weight has been obtained. Pt shows to be +6486 ml positive fluid balance.   Medications reviewed and include: IV abx, TPN Labs reviewed: Na 134 (L) BUN 53 (H) Creatinine 1.23 (H) Phosphorus 6.2 (H) Mg 2.5 (H) AST 136 (H) ALT 116 (H) Alkaline phos 170 (H) TGs 465  (H)  Diet Order:  Diet clear liquid Room service appropriate? Yes; Fluid consistency: Thin TPN (CLINIMIX) Adult without lytes  EDUCATION NEEDS:   Not appropriate for education at this time  Skin:  Skin Assessment: Reviewed RN Assessment  Last BM:  09/26/17  Height:   Ht Readings from Last 1 Encounters:  09/22/17 5\' 2"  (1.575 m)    Weight:   Wt Readings from Last 1 Encounters:  09/22/17 139 lb (63 kg)    Ideal Body Weight:  50 kg  BMI:  Body mass index is 25.42 kg/m.  Estimated Nutritional Needs:   Kcal:  1700-1900  Protein:  80-90 grams  Fluid:  >/= 1.7 L/d    Mariana Single RD, LDN Clinical Nutrition Pager # - [phone]

## 2017-09-28 NOTE — Evaluation (Signed)
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 Patient reports poor oral intake and unable to eat solid foods for past few weeks but 24 hours prior to this admission, she felt a lot worse, bloated, uncomfortable. /p EXPLORATORY LAPAROTOMY WITH PALLIATIVE SMALL BOWEL TO RIGHT COLON BY PASS WITH PLACEMENT OF GASTROSTOMY TUBE 1/10 Dr  Clinical Impression  Pt admitted with above diagnosis. Pt currently with functional limitations due to the deficits listed below (see PT Problem List). Min assist for bed mobility. Pt ambulated 45' with RW, HR 130 max while walking. Activity tolerance limited by pain and fatigue. Good progress and return to independence with mobility expected.  Pt will benefit from skilled PT to increase their independence and safety with mobility to allow discharge to the venue listed below.       Follow Up Recommendations No PT follow up    Equipment Recommendations  Rolling walker with 5" wheels    Recommendations for Other Services       Precautions / Restrictions Precautions Precautions: Fall Precaution Comments: multiple lines Restrictions Weight Bearing Restrictions: No      Mobility  Bed Mobility Overal bed mobility: Needs Assistance Bed Mobility: Rolling;Sidelying to Sit;Sit to Supine Rolling: Min assist Sidelying to sit: Min assist   Sit to supine: Min assist   General bed mobility comments: instructed pt in log rolling, min A to initiate roll and to raise trunk; min A LEs into bed  Transfers Overall transfer level: Needs assistance Equipment used: None Transfers: Sit to/from  [employer] Transfers   Stand pivot transfers: Min guard       General transfer comment: pt needed increased time 2* pain  Ambulation/Gait Ambulation/Gait assistance: Supervision Ambulation Distance (Feet): 90 Feet Assistive device: Rolling walker (2 wheeled) Gait Pattern/deviations: Step-through pattern   Gait velocity interpretation: Below normal speed for age/gender General Gait Details: slow but steady pace, HR 130 with ambulation  Stairs            Wheelchair Mobility    Modified Rankin (Stroke Patients Only)       Balance Overall balance assessment: No apparent balance deficits (not formally assessed)                                           Pertinent Vitals/Pain Pain Assessment: 0-10 Pain Score: 3  Pain Location: abdomen Pain Descriptors / Indicators: Sore Pain Intervention(s): Limited activity within patient's tolerance;Monitored during session;PCA encouraged;Repositioned(instructed pt to brace abdomen with pillow)    Home Living Family/patient expects to be discharged to:: Private residence Living Arrangements: Spouse/significant other Available Help at Discharge: Family Type of Home: House Home Access: Stairs to enter   Technical brewer of Steps: 4 Home Layout: One level Home Equipment: None      Prior Function Level of Independence: Independent               Hand Dominance        Extremity/Trunk Assessment   Upper Extremity Assessment Upper Extremity Assessment: Defer to OT evaluation    Lower Extremity Assessment Lower Extremity Assessment: Generalized weakness(B knee  extension 4/5, edema BLEs noted, sensation intact to light touch)    Cervical / Trunk Assessment Cervical / Trunk Assessment: Normal  Communication   Communication: No difficulties  Cognition Arousal/Alertness: Awake/alert Behavior During Therapy: WFL for tasks assessed/performed Overall Cognitive Status: Within Functional  Limits for tasks assessed                                        General Comments      Exercises     Assessment/Plan    PT Assessment Patient needs continued PT services  PT Problem List Decreased mobility;Decreased activity tolerance;Cardiopulmonary status limiting activity       PT Treatment Interventions Functional mobility training;Therapeutic exercise;Therapeutic activities;Gait training    PT Goals (Current goals can be found in the Care Plan section)  Acute Rehab PT Goals Patient Stated Goal: take care of 4 y.o. grandaughter PT Goal Formulation: With patient Time For Goal Achievement: 10/12/17 Potential to Achieve Goals: Good    Frequency Min 3X/week   Barriers to discharge        Co-evaluation               AM-PAC PT "6 Clicks" Daily Activity  Outcome Measure Difficulty turning over in bed (including adjusting bedclothes, sheets and blankets)?: Unable Difficulty moving from lying on back to sitting on the side of the bed? : Unable Difficulty sitting down on and standing up from a chair with arms (e.g., wheelchair, bedside commode, etc,.)?: A Little Help needed moving to and from a bed to chair (including a wheelchair)?: A Little Help needed walking in hospital room?: A Little Help needed climbing 3-5 steps with a railing? : A Lot 6 Click Score: 13    End of Session   Activity Tolerance: Patient tolerated treatment well Patient left: with call bell/phone within reach;in bed Nurse Communication: Mobility status PT Visit Diagnosis: Difficulty in walking, not elsewhere classified (R26.2)    Time: 8657-8469 PT Time Calculation (min) (ACUTE ONLY): 33 min   Charges:   PT Evaluation $PT Eval Low Complexity: 1 Low PT Treatments $Gait Training: 8-22 mins   PT G Codes:         Philomena Doheny 09/28/2017, 2:40 PM [phone]

## 2017-09-28 NOTE — Progress Notes (Addendum)
PHARMACY - ADULT TOTAL PARENTERAL NUTRITION CONSULT NOTE & ANTIBIOTIC NOTE  Pharmacy Consult for TPN & Zosyn Indication: bowel obstruction, empiric abx for leukocytosis  Patient Measurements: Height: _0  (157.5 cm) Weight: 139 lb (63 kg) IBW/kg (Calculated) : 50.1 TPN AdjBW (KG): 53.3 Body mass index is 25.42 kg/m. 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 CT abdomen/pelviswith contrast shows progressing SBO. Having BMs but with 1450 ml from NGT, Surgery recommends starting TPN. Patient with significant recent weight loss but primarily d/t multiple large-volume paracentesis. Poor intake since week before Xmas, and unable to keep anything down since 12/31. Appears to be at risk for refeeding.  Significant events:  1/9 endoscopy/colonoscopy showed mass in cecum 1/10:  Exp lap with palliative small bowel to right colon bypass with placement of gastrostomy 1/14 continue g tube to drain and allow to drink for comfort, tolerating clear liquids; lipids stopped 1/15 take lytes out of TPN, rate to 1/2 per [facility] 2nd elevated LFTs  Recent Labs    09/27/17 0449 09/28/17 0523  NA 135 134*  K 4.6 4.5  CL 103 100*  CO2 24 23  GLUCOSE 134* 126*  BUN 35* 53*  CREATININE 0.94 1.23*  CALCIUM 7.8* 7.9*  PHOS 5.1* 6.2*  MG 2.3 2.5*  ALBUMIN 1.4*  --   ALKPHOS 170*  --   AST 136*  --   ALT 116*  --   BILITOT 1.8*  --   TRIG 465*  --   PREALBUMIN  6.1*  --   Corr Ca  9.3 (09/26/17)  Today:   Glucose (goal <150):  DC SSI/CBGs 1/14  Electrolytes: rising, K 4.5, phos elevated at 6.2, CoCa 9.98 (CaxPhos ~ 62 - goal < 55)  mag elevated at 2.5. Na 134,   Renal: SCr up to 1.23, given lasix 40 on 1/14;  drains out 900 ml, 1 BM recorded 1/13 In Epic  but pt denies having BM  LFTs: slightly elevated on admit, now rising, AST/ALT 136/116, alk phos 170, t bili elevated at 1.8  TGs: elevated 180 (1/7) 465 (1/14)  Prealbumin: 8.4 (1.7) and 6.1 on 1/14  NUTRITIONAL GOALS  RD recs (09/20/17):  Kcal:  1700-1900 Protein:  80-90 grams Fluid:  >/= 1.7 L/d  Clinimix-E 5/15 at a goal rate of 75 ml/hr + 20% fat emulsion 240 ml / day to provide: 90 g/day protein, 1758 Kcal/day. ID: Day #4 Zosyn Leukocytosis - r/o pna, r/o uti Remains afeb; leukocytosis stable at 16; CXR 1/12 showed atelectasis and possible small B pleural effusions, but no infiltrate; U/A (+) for Hgb, trace leukocytes, few bacteria 1/10 >> Cefotan x 1 1/12 >> Zosyn   Dose adjustments this admission: n/a   Microbiology results: 1/10 MRSA PCR: negative 1/1   UCx: NGF 1/12 UCx: NGF   PLAN                                                                                  At 1800 today:  Continue Zosyn 3.375 gm IV q8h, infuse each dose over 4 hours   Take electrolytes out of TPN today and cut rate to 40 ml/hr per [facility] order to cut rate in 1/2: Clinimix 5/15 at 40 ml/hr  20% fat emulsion was stopped Monday 1/14  2nd Trig > 400 (465 1/14)   This provides 682 kcal and 48 gm protein for 40% kcal and 60 % protein needs  TPN to contain standard multivitamins and trace elements  IVF per MD  TPN lab panels on Mondays & Thursdays  Repeat BMET, mag/phos in am  F/u daily  Eudelia Bunch, Pharm.D. [phone] 09/28/2017 7:42 AM

## 2017-09-28 NOTE — Progress Notes (Signed)
  Oncology Nurse Navigator Documentation  Navigator Location: [facility] (09/28/17 1353) Referral date to RadOnc/MedOnc: 09/10/17 (09/28/17 1353) )Navigator Encounter Type: Other(Inpatient - introductory visit) (09/28/17 1353)   Abnormal Finding Date: 09/22/17 (09/28/17 1353)   Surgery Date: 09/23/17 (09/28/17 1353)           Treatment Initiated Date: 09/23/17 (09/28/17 1353) Patient Visit Type: Inpatient (09/28/17 1353)       Interventions: Psycho-social support (09/28/17 1353)  Met with Christie Maynard on in-patient unit to introduce myself and my role as GI Navigator. We discussed the different members of her care team at [facility] and the role we will all play in her treatment.  Christie Maynard states, "I'm ready to fight this." Patient able to identify a strong support system of family , friends and church members who will help her through her treatments. Patient encouraged to call me with questions or concerns along the way.          Acuity: Level 2 (09/28/17 1353)   Acuity Level 2: Initial guidance, education and coordination as needed (09/28/17 1353)     Time Spent with Patient: 30 (09/28/17 1353)

## 2017-09-28 NOTE — Progress Notes (Signed)
Medications administered by student RN [id number] with supervision of Clinical Instructor Thorvald Orsino MSN, RN-BC or patient's assigned RN.   

## 2017-09-28 NOTE — Progress Notes (Signed)
PT Cancellation Note  Patient Details Name: Christie Maynard MRN: [id number] DOB: October 02, 1955   Cancelled Treatment:    Reason Eval/Treat Not Completed: Fatigue/lethargy limiting ability to participate(pt just returned to bed after being up in recliner for 2 hours, she is too fatigued to do more activity right now and requested PT attempt later today. Will follow. )   Philomena Doheny 09/28/2017, 11:35 AM [phone]

## 2017-09-28 NOTE — Progress Notes (Signed)
5 Days Post-Op    CC: Nausea and vomiting with peritoneal carcinomatosis.  Subjective: She has been up a lot yesterday and today after receiving Lasix.  Her abdomen is not distended, she seems fairly comfortable.  Gastrostomy tube is open and draining to a Foley bag.  Midline incision looks good.  Objective: Vital signs in last 24 hours: Temp:  [97.5 F (36.4 C)-98.7 F (37.1 C)] 98.7 F (37.1 C) (01/15 0620) Pulse Rate:  [80-135] 91 (01/15 0620) Resp:  [14-30] 22 (01/15 0934) BP: (110-146)/(75-93) 127/87 (01/15 0620) SpO2:  [93 %-99 %] 97 % (01/15 0934) Last BM Date: 09/26/17 660 p.o. Recorded. 1300 IV 2100 urine 1500 through the gastrostomy tube. Afebrile vital signs are stable Creatinine is up to 1.23.  WBC remains elevated at 16.2.  LFTs up yesterday.  Intake/Output from previous day: 01/14 0701 - 01/15 0700 In: 1965.3 [P.O.:660; I.V.:1155.3; IV Piggyback:150] Out: 3600 [Urine:2100; Drains:1500] Intake/Output this shift: No intake/output data recorded.  General appearance: alert, cooperative and no distress GI: Soft, sore, midline incision looks good.  Gastrostomy tube is functioning well.  Open straight drain to Foley bag.  Few bowel sounds.  No flatus or BM.  Lab Results:  Recent Labs    09/27/17 0449 09/28/17 0523  WBC 16.3* 16.2*  HGB 11.1* 10.4*  HCT 33.5* 30.5*  PLT 343 360    BMET Recent Labs    09/27/17 0449 09/28/17 0523  NA 135 134*  K 4.6 4.5  CL 103 100*  CO2 24 23  GLUCOSE 134* 126*  BUN 35* 53*  CREATININE 0.94 1.23*  CALCIUM 7.8* 7.9*   PT/INR No results for input(s): LABPROT, INR in the last 72 hours.  Recent Labs  Lab 09/23/17 0444 09/27/17 0449  AST 37 136*  ALT 29 116*  ALKPHOS 116 170*  BILITOT 0.8 1.8*  PROT 5.9* 5.1*  ALBUMIN 2.1* 1.4*     Lipase     Component Value Date/Time   LIPASE 69 (H) 09/14/2017 1149     Medications: . enoxaparin (LOVENOX) injection  40 mg Subcutaneous Q24H  . feeding supplement  1  Container Oral TID BM  . morphine   Intravenous Q4H  . phenytoin (DILANTIN) IV  100 mg Intravenous QHS  Colon, biopsy, cecum - ADENOCARCINOMA WITH SIGNET RING FEATURES . Marland KitchenTPN (CLINIMIX-E) Adult 75 mL/hr at 09/27/17 1806  . sodium chloride 10 mL/hr at 09/25/17 1214  . piperacillin-tazobactam (ZOSYN)  IV 3.375 g (09/28/17 0410)  . TPN (CLINIMIX) Adult without lytes       Assessment/Plan SBO/cecal mass Metastatic colon cancer, carcinomatosis S/p EXPLORATORY LAPAROTOMY WITH PALLIATIVE SMALL BOWEL TO RIGHT COLON BY PASS WITH PLACEMENT OF GASTROSTOMY TUBE 1/10 Dr. Marlou Starks - POD 5 - G tube to gravity, clear liquids for comfort - WBC 16.3 slightly down from yesterday 17.8, afebrile. CXR 1/12 and Ucx 1/12 negative. Continue zosyn and monitor WBC 16.2 - Ambulate/IS - honeycomb dressing can be removed tomorrow  Elevated creatinine -1.23 today I/O is -1600 mL yesterday  ID - zosyn 1/12>>, cefotetan 1/10>>1/11 FEN - TPN, clear liquids, Boost VTE - lovenox Foley - none Follow up - Dr. Marlou Starks   Plan: Continue current treatment.  She has elevated LFTs and an elevated creatinine today.  She received Lasix yesterday which would explain her elevated creatinine.  LFTs are most likely related to her TNA.  Discussed with Dr. Dalbert Batman and from our standpoint we could decrease the TNA and monitor.  Will defer to Dr. Grandville Silos on fluid management.  LOS: 13 days    Shermaine Rivet 09/28/2017 [phone]

## 2017-09-28 NOTE — Progress Notes (Signed)
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 Patient reports poor oral intake and unable to eat solid foods for past few weeks but 24 hours prior to this admission, she felt a lot worse, bloated, uncomfortable. Off note patient had appointment with gastroenterologist scheduled for 09/15/2017 due to cytology results concerning for adenocarcinoma.    Assessment & Plan:   Principal Problem:   SBO (small bowel obstruction) (HCC) Active Problems:   Colonic mass   Ascites   Nausea & vomiting   Peritoneal carcinomatosis (HCC)   History of seizures   Nausea and vomiting   Dehydration   Neoplasm of colon, malignant (HCC)   Leukocytosis   Peritoneal Carcinomatosis  Ascites  Nausea and vomiting  Small Bowel Obstruction/cecal mass:  Pt was following with Dr. 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  - repeat x ray 1/8 with persistent partial SBO - surgery c/s - TPN for nutrition support.  Patient underwent upper endoscopy 09/22/2017, which was unremarkable, colonoscopy was concerning for cecal mass.  Patient has been seen in consultation by general surgery and patient status post exploratory laparotomy with palliative small bowel to right  colon bypass with placement of gastrostomy tube per Dr. Marlou Starks 09/23/2017.  Tolerating clear liquids.  General surgery following.  Oncology following.  Continue antiemetics.  Patient on PCA pump.  We will also start Roxanol sublingual as needed.  Supportive care.   Hypokalemia  Hypophosphatemia: Potassium repleted.  Phosphorus levels elevated.  Per pharmacy.    Anion gap metabolic acidosis:improved  L ureteral obstruction s/p stent placement: Creatinine stable.  Outpatient follow-up.    Abnormal UA: Initial urine cultures were negative.  Patient now with a worsening leukocytosis.  A repeat UA nitrite negative, trace leukocytes.  Too numerous to count WBCs.  Urine culture is negative.    Leukocytosis: Worsened postop.  ChestX-ray negative for acute infiltrate.  Urinalysis cloudy, trace leukocytes, nitrite negative, too numerous to count WBCs, clumps present.  Urine culture negative.  WBC seems to be fluctuant and seems to have plateaued around 16,000.  Continue empiric IV Zosyn.  Continue incentive spirometry.   Mildly elevated liver enzymes: Enzymes much elevated on 09/27/2017.  LFTs have trended down after a dose of IV Lasix given. Concern as to whether secondary to TNA.  TNA dose to be decreased to half the dose per general surgery today.  Patient also with some lower extremity edema.  We will give a dose of Lasix 20 mg IV x1 and follow.    Mildly elevated lipase: imaging no signs of pancreatitis.  Patient clinically with no signs of pancreatitis.  Monitor for now.    History of seizures:has had 1 seizure in 1980's, none since.  No seizures noted during this hospitalization.  Continue IV Dilantin for now.  Transition to oral Dilantin when patient tolerating  a diet.         DVT prophylaxis: Lovenox Code Status: Full Family Communication: Updated patient. No family at bedside.  Disposition Plan: May need skilled nursing facility vs Home with home health.  Per general  surgery.   Consultants:   Otology/oncology Dr. Benay Spice 1 [phone]  Gastroenterology Dr. Oletta Lamas 09/15/2017  General surgery Dr. Harlow Asa 09/15/2017  GYN oncology  Procedures:   Endoscopy per Dr. Cristina Gong 09/22/2016  Colonoscopy per Dr. Cristina Gong 09/22/2017  CT abdomen and pelvis 09/14/2017  CT-guided core biopsy of peritoneal tumor per Dr. Kathlene Cote 09/17/2017  Abdominal x-ray 09/15/2017, 10/01/2017  Acute abdominal series 09/14/2017  Exploratory laparotomy with palliative small bowel to right colon bypass with placement of gastrostomy tube per Dr. Marlou Starks 09/23/2017  Chest x-ray 09/25/2017  Antimicrobials:   IV Zosyn 09/25/2017     Subjective: Patient sitting up in chair.  Denies any chest pain no shortness of breath.  States has been using pain medication when she moves around due to increased pain.  No flatus.  No bowel movements yet.    Objective: Vitals:   09/28/17 0127 09/28/17 0400 09/28/17 0620 09/28/17 0934  BP:   127/87   Pulse: 80  91   Resp:  14 16 (!) 22  Temp:   98.7 F (37.1 C)   TempSrc:   Oral   SpO2:  98% 96% 97%  Weight:      Height:        Intake/Output Summary (Last 24 hours) at 09/28/2017 1039 Last data filed at 09/28/2017 1000 Gross per 24 hour  Intake 1845.33 ml  Output 3700 ml  Net -1854.67 ml   Filed Weights   09/14/17 1127 09/14/17 2110 09/22/17 1217  Weight: 68 kg (150 lb) 63.4 kg (139 lb 12.8 oz) 63 kg (139 lb)    Examination:  General exam: NAD Respiratory system: Lungs clear to auscultation bilaterally.  No wheezes, no crackles, no rhonchi. Respiratory effort normal. Cardiovascular system: Regular rate rhythm no murmurs rubs or gallops.  1-2+ lower extremity edema. No JVD.  Gastrointestinal system: Abdomen is soft, decreasing diffuse tenderness to palpation, hypoactive bowel sounds.  No rebound. G-tube intact and draining. Central nervous system: Alert and oriented. No focal neurological deficits. Extremities: Symmetric 5 x 5 power. Skin: No  rashes, lesions or ulcers Psychiatry: Judgement and insight appear normal. Mood & affect appropriate.     Data Reviewed: I have personally reviewed following labs and imaging studies  CBC: Recent Labs  Lab 09/24/17 0403 09/25/17 0518 09/26/17 0402 09/27/17 0449 09/28/17 0523  WBC 18.7* 22.3* 17.8* 16.3* 16.2*  NEUTROABS 14.5*  --  14.3* 13.4*  --   HGB 14.0 12.1 11.3* 11.1* 10.4*  HCT 43.0 37.6 34.5* 33.5* 30.5*  MCV 88.1 87.9 86.0 85.7 84.7  PLT 296 267 316 343 606   Basic Metabolic Panel: Recent Labs  Lab 09/22/17 0334 09/23/17 0444 09/24/17 0403 09/25/17 0518 09/26/17 0402 09/27/17 0449 09/28/17 0523  NA 140 139 136 136 135 135 134*  K 3.5 4.2 4.7 4.8 4.4 4.6 4.5  CL 111 108 107 105 102 103 100*  CO2 24 25 26 25 26 24 23   GLUCOSE 131* 130* 156* 140* 137* 134* 126*  BUN 16 19 25* 34* 33* 35* 53*  CREATININE 0.58 0.70 0.78 0.86 0.75 0.94 1.23*  CALCIUM 7.6* 8.1* 7.7* 7.7* 7.8* 7.8* 7.9*  MG 2.1 2.3  --  2.1  --  2.3 2.5*  PHOS 3.0 2.8  --  3.5  --  5.1* 6.2*   GFR: Estimated Creatinine Clearance: 40.9 mL/min (A) (by C-G formula based on SCr of 1.23 mg/dL (H)). Liver Function Tests: Recent Labs  Lab 09/23/17 0444 09/27/17 0449  AST 37 136*  ALT 29 116*  ALKPHOS 116 170*  BILITOT 0.8 1.8*  PROT 5.9* 5.1*  ALBUMIN 2.1* 1.4*   No results for input(s): LIPASE, AMYLASE in the last 168 hours. No results for input(s): AMMONIA in the last 168 hours. Coagulation Profile: No results for input(s): INR, PROTIME in the last 168 hours. Cardiac Enzymes: No results for input(s): CKTOTAL, CKMB, CKMBINDEX, TROPONINI in the last 168 hours. BNP (last 3 results) No results for input(s): PROBNP in the last 8760 hours. HbA1C: No results for input(s): HGBA1C in the last 72 hours. CBG: Recent Labs  Lab 09/26/17 0618 09/26/17 1237 09/26/17 1727 09/26/17 2305 09/27/17 0639  GLUCAP 126* 134* 134* 131* 137*   Lipid Profile: Recent Labs    09/27/17 0449  TRIG 465*    Thyroid Function Tests: Recent Labs    09/27/17 0449  TSH 4.372   Anemia Panel: No results for input(s): VITAMINB12, FOLATE, FERRITIN, TIBC, IRON, RETICCTPCT in the last 72 hours. Sepsis Labs: No results for input(s): PROCALCITON, LATICACIDVEN in the last 168 hours.  Recent Results (from the past 240 hour(s))  Surgical PCR screen     Status: None   Collection Time: 09/23/17 12:03 AM  Result Value Ref Range Status   MRSA, PCR NEGATIVE NEGATIVE Final   Staphylococcus aureus NEGATIVE NEGATIVE Final    Comment: (NOTE) The Xpert SA Assay (FDA approved for NASAL specimens in patients 28 years of age and older), is one component of a comprehensive surveillance program. It is not intended to diagnose infection nor to guide or monitor treatment.   Culture, Urine     Status: None   Collection Time: 09/25/17 11:30 AM  Result Value Ref Range Status   Specimen Description URINE, RANDOM  Final   Special Requests NONE  Final   Culture   Final    NO GROWTH Performed at [facility], [address], [place], [place] [address]    Report Status 09/26/2017 FINAL  Final         Radiology Studies: No results found.      Scheduled Meds: . enoxaparin (LOVENOX) injection  40 mg Subcutaneous Q24H  . feeding supplement  1 Container Oral TID BM  . morphine   Intravenous Q4H  . phenytoin (DILANTIN) IV  100 mg Intravenous QHS   Continuous Infusions: . sodium chloride 10 mL/hr at 09/25/17 1214  . piperacillin-tazobactam (ZOSYN)  IV 3.375 g (09/28/17 0410)  . TPN (CLINIMIX) Adult without lytes       LOS: 13 days    Time spent: 35 minutes    Irine Seal, MD [facility] Pager [phone]  If 7PM-7AM, please contact night-coverage [email or web address] Password [facility] 09/28/2017, 10:39 AM

## 2017-09-29 DIAGNOSIS — D72823 Leukemoid reaction: Secondary | ICD-10-CM

## 2017-09-29 LAB — COMPREHENSIVE METABOLIC PANEL
ALT: 74 U/L — ABNORMAL HIGH (ref 14–54)
AST: 63 U/L — AB (ref 15–41)
Albumin: 1.4 g/dL — ABNORMAL LOW (ref 3.5–5.0)
Alkaline Phosphatase: 186 U/L — ABNORMAL HIGH (ref 38–126)
Anion gap: 10 (ref 5–15)
BUN: 57 mg/dL — AB (ref 6–20)
CO2: 27 mmol/L (ref 22–32)
CREATININE: 1.45 mg/dL — AB (ref 0.44–1.00)
Calcium: 8.2 mg/dL — ABNORMAL LOW (ref 8.9–10.3)
Chloride: 99 mmol/L — ABNORMAL LOW (ref 101–111)
GFR, EST AFRICAN AMERICAN: 43 mL/min — AB (ref >60)
GFR, EST NON AFRICAN AMERICAN: 37 mL/min — AB (ref >60)
Glucose, Bld: 101 mg/dL — ABNORMAL HIGH (ref 65–99)
POTASSIUM: 4.1 mmol/L (ref 3.5–5.1)
Sodium: 136 mmol/L (ref 135–145)
Total Bilirubin: 2.9 mg/dL — ABNORMAL HIGH (ref 0.3–1.2)
Total Protein: 5.5 g/dL — ABNORMAL LOW (ref 6.5–8.1)

## 2017-09-29 LAB — CBC
HEMATOCRIT: 28.8 % — AB (ref 36.0–46.0)
Hemoglobin: 9.7 g/dL — ABNORMAL LOW (ref 12.0–15.0)
MCH: 28.3 pg (ref 26.0–34.0)
MCHC: 33.7 g/dL (ref 30.0–36.0)
MCV: 84 fL (ref 78.0–100.0)
PLATELETS: 402 10*3/uL — AB (ref 150–400)
RBC: 3.43 MIL/uL — AB (ref 3.87–5.11)
RDW: 15.8 % — AB (ref 11.5–15.5)
WBC: 15.2 10*3/uL — AB (ref 4.0–10.5)

## 2017-09-29 LAB — MAGNESIUM: MAGNESIUM: 2.5 mg/dL — AB (ref 1.7–2.4)

## 2017-09-29 LAB — PHOSPHORUS: PHOSPHORUS: 5.9 mg/dL — AB (ref 2.5–4.6)

## 2017-09-29 MED ORDER — TRACE MINERALS CR-CU-MN-SE-ZN 10-1000-500-60 MCG/ML IV SOLN
INTRAVENOUS | Status: AC
  Administered 2017-09-29: 17:00:00 via INTRAVENOUS
  Filled 2017-09-29: qty 960

## 2017-09-29 NOTE — Progress Notes (Signed)
PHARMACY - ADULT TOTAL PARENTERAL NUTRITION CONSULT NOTE & ANTIBIOTIC NOTE  Pharmacy Consult for TPN  Indication: bowel obstruction  Patient Measurements: Height: 5' 2"  (157.5 cm) Weight: 139 lb (63 kg) IBW/kg (Calculated) : 50.1 TPN AdjBW (KG): 53.3 Body mass index is 25.42 kg/m. 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 CT abdomen/pelviswith contrast shows progressing SBO. Having BMs but with 1450 ml from NGT, Surgery recommends starting TPN. Patient with significant recent weight loss but primarily d/t multiple large-volume paracentesis. Poor intake since week before Xmas, and unable to keep anything down since 12/31. Appears to be at risk for refeeding.  Significant events:  1/9 endoscopy/colonoscopy showed mass in cecum 1/10:  Exp lap with palliative small bowel to right colon bypass with placement of gastrostomy 1/14 continue g tube to drain and allow to drink for comfort, tolerating clear liquids; lipids stopped 1/15 take lytes out of TPN, rate to 1/2 per [facility] 2nd elevated LFTs.  Per [facility] notes, GI tract not functioning at this time. GI function may never return due to tumor burden, malignant SBO, with nothing left to do surgically. TPN is not indicated in this situation but has not been addressed with family.   Recent Labs    09/27/17 0449 09/28/17 0523 09/28/17 1052 09/29/17 0303  NA 135 134*  --  136  K 4.6 4.5  --  4.1  CL 103  100*  --  99*  CO2 24 23  --  27  GLUCOSE 134* 126*  --  101*  BUN 35* 53*  --  57*  CREATININE 0.94 1.23*  --  1.45*  CALCIUM 7.8* 7.9*  --  8.2*  PHOS 5.1* 6.2*  --  5.9*  MG 2.3 2.5*  --  2.5*  ALBUMIN 1.4*  --  1.3* 1.4*  ALKPHOS 170*  --  174* 186*  AST 136*  --  67* 63*  ALT 116*  --  83* 74*  BILITOT 1.8*  --  2.5* 2.9*  TRIG 465*  --   --   --   PREALBUMIN 6.1*  --   --   --    Today, 09/29/2017:   Glucose (goal <150):  DC SSI/CBGs 1/14  Electrolytes: removed from TPN formula yesterday 1/15  K 4.1 (improved)   Phos elevated at 5.9 but improved   CoCa 10.28 (CaxPhos ~ 60.6 - goal < 55)  Mg elevated at 2.5 (unchanged)  Na 136 (improved)   Renal: SCr up to  1.45, given lasix 40 on 1/14;  drains out 900 ml, 1 BM recorded 1/13 In Epic  but pt denies having BM  LFTs: slightly elevated on admit, increased 1/14, AST/ALT slightly improved today 63/74, Alk phos increased 186, Tbili elevated at 2.9  TGs: elevated 180 (1/7) 465 (1/14)  Prealbumin: 8.4 (1.7) and 6.1 on 1/14  NUTRITIONAL GOALS                                                                                             RD recs (09/20/17):  Kcal:  1700-1900 Protein:  80-90 grams Fluid:  >/= 1.7 L/d  Clinimix-E 5/15 at a goal rate of 75 ml/hr + 20% fat emulsion 240 ml / day to provide: 90 g/day protein, 1758 Kcal/day.  PLAN                                                                                  If TPN continues today, at 1800 today:   Continue Clinimix (no electrolytes) 5/15 at 40 ml/hr  20% fat emulsion was stopped Monday 1/14  2nd Trig > 400 (465 1/14)   This provides 682 kcal and 48 gm protein for 40% kcal and 60 % protein needs  TPN to contain standard multivitamins and trace elements  IVF per MD  TPN lab panels on Mondays & Thursdays  F/u daily  Peggyann Juba, PharmD, BCPS Pager: [phone] 09/29/2017 7:46 AM

## 2017-09-29 NOTE — Progress Notes (Signed)
Pt out in the hall ambulated 100 feet from room to nursing station near pt's room. Pt endurance has improved however continue with mild SOB with exertion. Husband and sister at bedside. Pain med given orally after walking. Will cont to monitor. SRP RN

## 2017-09-29 NOTE — Progress Notes (Signed)
[facility] Progress Note  6 Days Post-Op  Subjective: CC-  No complaints this morning. States that she feels about the same. Continues to have some abdominal bloating. She reports little abdominal pain. Denies n/v. Tolerating clear liquids with G-tube to gravity. No BM or flatus. She has been ambulating well around the room.  Objective: Vital signs in last 24 hours: Temp:  [97.4 F (36.3 C)-97.9 F (36.6 C)] 97.8 F (36.6 C) (01/16 0403) Pulse Rate:  [90-106] 93 (01/16 0403) Resp:  [18-25] 22 (01/16 0403) BP: (110-132)/(80-87) 118/84 (01/16 0403) SpO2:  [96 %-100 %] 100 % (01/16 0403) Last BM Date: 09/26/17  Intake/Output from previous day: 01/15 0701 - 01/16 0700 In: 1043.3 [P.O.:150; I.V.:743.3; IV Piggyback:150] Out: 2650 [Urine:1750; Drains:900] Intake/Output this shift: Total I/O In: 0  Out: 1400 [Urine:200; Drains:1200]  PE: Gen:  Alert, NAD, pleasant HEENT: EOM's intact, pupils equal and round Card:  RRR, no M/G/R heard Pulm:  effort normal Abd: Soft, mild distension, hypoactive BS, midline incision C/D/I with staples intact and no erythema or drainage, G-tube to gravity Psych: A&Ox3  Skin: no rashes noted, warm and dry  Lab Results:  Recent Labs    09/28/17 0523 09/29/17 0303  WBC 16.2* 15.2*  HGB 10.4* 9.7*  HCT 30.5* 28.8*  PLT 360 402*   BMET Recent Labs    09/28/17 0523 09/29/17 0303  NA 134* 136  K 4.5 4.1  CL 100* 99*  CO2 23 27  GLUCOSE 126* 101*  BUN 53* 57*  CREATININE 1.23* 1.45*  CALCIUM 7.9* 8.2*   PT/INR No results for input(s): LABPROT, INR in the last 72 hours. CMP     Component Value Date/Time   NA 136 09/29/2017 0303   K 4.1 09/29/2017 0303   CL 99 (L) 09/29/2017 0303   CO2 27 09/29/2017 0303   GLUCOSE 101 (H) 09/29/2017 0303   BUN 57 (H) 09/29/2017 0303   CREATININE 1.45 (H) 09/29/2017 0303   CALCIUM 8.2 (L) 09/29/2017 0303   PROT 5.5 (L) 09/29/2017 0303   ALBUMIN 1.4 (L) 09/29/2017 0303   AST 63  (H) 09/29/2017 0303   ALT 74 (H) 09/29/2017 0303   ALKPHOS 186 (H) 09/29/2017 0303   BILITOT 2.9 (H) 09/29/2017 0303   GFRNONAA 37 (L) 09/29/2017 0303   GFRAA 43 (L) 09/29/2017 0303   Lipase     Component Value Date/Time   LIPASE 69 (H) 09/14/2017 1149       Studies/Results: No results found.  Anti-infectives: Anti-infectives (From admission, onward)   Start     Dose/Rate Route Frequency Ordered Stop   09/25/17 1200  piperacillin-tazobactam (ZOSYN) IVPB 3.375 g     3.375 g 12.5 mL/hr over 240 Minutes Intravenous Every 8 hours 09/25/17 1054     09/23/17 1249  cefoTEtan in Dextrose 5% (CEFOTAN) 2-2.08 GM-%(50ML) IVPB    Comments:  [name]   : cabinet override      09/23/17 1249 09/24/17 0059   09/23/17 1100  cefoTEtan (CEFOTAN) 2 g in dextrose 5 % 50 mL IVPB     2 g 100 mL/hr over 30 Minutes Intravenous  Once 09/22/17 1549 09/23/17 1333       Assessment/Plan SBO/cecal mass Metastatic colon cancer, carcinomatosis S/pEXPLORATORY LAPAROTOMY WITH PALLIATIVE SMALL BOWEL TO RIGHT COLON BY PASS WITH PLACEMENT OF GASTROSTOMY TUBE1/10 Dr. Marlou Starks - POD 6 -G tube to gravity, clear liquids for comfort - WBC 15.2, slowly trending down, afebrile. CXR 1/12 and Ucx 1/12 negative. Continue zosynand  monitor WBC 16.2 -Ambulate/IS  Elevated creatinine -1.45 today, good urine output Elevated LFTs - likely 2/2 TNA  ID -zosyn 1/12>>day#5, cefotetan 1/10>>1/11 FEN -TPN, clear liquids, Boost VTE -lovenox Foley -none Follow up -Dr. Marlou Starks   Plan: No bowel function. Continue current treatment with sips of clears and G-tube to gravity. Continue ambulating.   LOS: 14 days    Wellington Hampshire , [facility] 09/29/2017, 11:21 AM Pager: [phone] Consults: [phone] Mon-Fri 7:00 am-4:30 pm Sat-Sun 7:00 am-11:30 am

## 2017-09-29 NOTE — Progress Notes (Signed)
PROGRESS NOTE        PATIENT DETAILS Name: Christie Maynard Age: 62 y.o. Sex: female Date of Birth: 06/13/1956 Admit Date: 09/14/2017 Admitting Physician A Melven Sartorius., MD GYJ:EHUDJS, Baldemar Friday., PA-C  Brief Narrative: Patient is a 62 y.o. 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 She was subsequently diagnosed with small bowel obstruction, further evaluation with a colonoscopy revealed a cecal mass. She unfortunately appears to have metastatic colon cancer (and not ovarian cancer). She underwent a exploratory laparotomy with palliative small bowel to right colon bypass and placement of a gastrostomy tube on 1/10. Postoperatively, she she has developed a prolonged ileus and anasarca/volume overload.. See below for further details.   Subjective: Abdominal pain continues to improve-no BM or flatus yet.  Assessment/Plan: Principal Problem:  SBO (small bowel obstruction) likely secondary to a cecal mass: Underwent exploratory laparotomy, and palliative small bowel and right colon bypass with placement of a gastrostomy tube on 1/10. Unfortunately, seems to have developed prolonged ileus postoperatively. Continue TNA-awaited return of bowel function-will try to wean her of morphine PCA. Gen. surgery following directing care.  Metastatic colon cancer/peritoneal carcinomatosis: She was initially following with Dr. 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 Medical oncology (Dr. Benay Spice) following.  L ureteral obstruction s/p stent placement:Creatinine stable.  Outpatient follow-up.    Leukocytosis: I suspect this is mostly reactive-probably secondary to inflammation from peritoneal carcinomatosis-recent surgery. Do not see any foci of infection apparent-family should be able to stop Zosyn if okay with general surgery.  Anemia: Likely secondary to acute illness on top of chronic disease. Hemoglobin stable. Continue to follow periodically.  Acute kidney injury: Likely secondary to Lasix-hold Lasix for a few days.  Anasarca/lower extremity edema: Secondary to severe hypoalbuminemia-hold Lasix for a few days.  Transaminitis: Slowly resolving. Follow periodically.  Seizure disorder: Continue IV Dilantin-with plans to transition to oral route when she is able to tolerate more by mouth intake.  DVT Prophylaxis: Prophylactic Lovenox   Code Status: Full code   Family Communication: None at bedside  Disposition Plan: Remain inpatient  Antimicrobial agents: Anti-infectives (From admission, onward)   Start     Dose/Rate Route Frequency Ordered Stop   09/25/17 1200  piperacillin-tazobactam (ZOSYN) IVPB 3.375 g     3.375 g 12.5 mL/hr over 240 Minutes Intravenous Every 8 hours 09/25/17 1054     09/23/17 1249  cefoTEtan in Dextrose 5% (CEFOTAN) 2-2.08 GM-%(50ML) IVPB    Comments:  [name]   : cabinet override      09/23/17 1249 09/24/17 0059   09/23/17 1100  cefoTEtan (CEFOTAN) 2 g in dextrose 5 % 50 mL IVPB     2 g 100 mL/hr over 30 Minutes Intravenous  Once 09/22/17 1549 09/23/17 1333      Procedures:  CT-guided core biopsy of peritoneal tumor per Dr. Kathlene Cote 09/17/2017  Endoscopy per Dr. Cristina Gong 09/22/2016  Colonoscopy per Dr. Cristina Gong  09/22/2017  Exploratory laparotomy with palliative small bowel to right colon bypass with placement of gastrostomy tube per Dr. Marlou Starks 09/23/2017  CONSULTS:  Oncology    Gastroenterology   General surgery   GYN oncology   Time spent: 25- minutes-Greater than 50% of this time was spent in counseling, explanation of diagnosis, planning of further management, and coordination of care.  MEDICATIONS: Scheduled Meds: . enoxaparin (LOVENOX) injection  40 mg Subcutaneous Q24H  . feeding supplement  1 Container Oral TID BM  . morphine   Intravenous Q4H  . phenytoin (DILANTIN) IV  100 mg Intravenous QHS   Continuous Infusions: . sodium chloride 10 mL/hr at 09/25/17 1214  . piperacillin-tazobactam (ZOSYN)  IV 3.375 g (09/29/17 1203)  . TPN (CLINIMIX) Adult without lytes 40 mL/hr at 09/28/17 1725   PRN Meds:.diphenhydrAMINE **OR** diphenhydrAMINE, morphine CONCENTRATE, naloxone **AND** sodium chloride flush, ondansetron **OR** ondansetron (ZOFRAN) IV, sodium chloride flush   PHYSICAL EXAM: Vital signs: Vitals:   09/28/17 2341 09/29/17 0000 09/29/17 0400 09/29/17 0403  BP: 114/80   118/84  Pulse: 95   93  Resp: 20 (!) 24 (!) 21 (!) 22  Temp: (!) 97.5 F (36.4 C)   97.8 F (36.6 C)  TempSrc: Oral   Oral  SpO2: 99% 100% 99% 100%  Weight:      Height:       Filed Weights   09/14/17 1127 09/14/17 2110 09/22/17 1217  Weight: 68 kg (150 lb) 63.4 kg (139 lb 12.8 oz) 63 kg (139 lb)   Body mass index is 25.42 kg/m.   General appearance :Awake, alert, not in any distress. Speech Clear.  Eyes:, pupils equally reactive to light and accomodation,no scleral icterus. HEENT: Atraumatic and Normocephalic Neck: supple, no JVD. No cervical lymphadenopathy Resp:Good air entry bilaterally, no added sounds  CVS: S1 S2 regular, no murmurs.  GI: Bowel sounds very sluggish, Non tender and not distended. Midline incision in place-without any erythema or discharge-staples present. G-tube in place. Extremities: B/L Lower Ext shows ++ edema, both legs are warm to touch Neurology:  speech clear,Non focal, sensation is grossly intact. Musculoskeletal:No digital  cyanosis Skin:No Rash, warm and dry Wounds:N/A  I have personally reviewed following labs and imaging studies  LABORATORY DATA: CBC: Recent Labs  Lab 09/24/17 0403 09/25/17 0518 09/26/17 0402 09/27/17 0449 09/28/17 0523 09/29/17 0303  WBC 18.7* 22.3* 17.8* 16.3* 16.2* 15.2*  NEUTROABS 14.5*  --  14.3* 13.4*  --   --   HGB 14.0 12.1 11.3* 11.1* 10.4* 9.7*  HCT 43.0 37.6 34.5* 33.5* 30.5* 28.8*  MCV 88.1 87.9 86.0 85.7 84.7 84.0  PLT 296 267 316 343 360 402*    Basic Metabolic Panel: Recent Labs  Lab 09/23/17 0444  09/25/17 0518 09/26/17 0402 09/27/17 0449 09/28/17 0523 09/29/17 0303  NA 139   < > 136 135 135 134* 136  K 4.2   < > 4.8 4.4 4.6 4.5 4.1  CL 108   < > 105 102 103 100* 99*  CO2 25   < > 25 26 24 23 27   GLUCOSE 130*   < > 140* 137* 134* 126* 101*  BUN 19   < > 34* 33* 35* 53* 57*  CREATININE 0.70   < > 0.86 0.75 0.94 1.23* 1.45*  CALCIUM 8.1*   < > 7.7* 7.8* 7.8* 7.9* 8.2*  MG 2.3  --  2.1  --  2.3 2.5* 2.5*  PHOS 2.8  --  3.5  --  5.1* 6.2*  5.9*   < > = values in this interval not displayed.    GFR: Estimated Creatinine Clearance: 34.7 mL/min (A) (by C-G formula based on SCr of 1.45 mg/dL (H)).  Liver Function Tests: Recent Labs  Lab 09/23/17 0444 09/27/17 0449 09/28/17 1052 09/29/17 0303  AST 37 136* 67* 63*  ALT 29 116* 83* 74*  ALKPHOS 116 170* 174* 186*  BILITOT 0.8 1.8* 2.5* 2.9*  PROT 5.9* 5.1* 5.3* 5.5*  ALBUMIN 2.1* 1.4* 1.3* 1.4*   No results for input(s): LIPASE, AMYLASE in the last 168 hours. No results for input(s): AMMONIA in the last 168 hours.  Coagulation Profile: No results for input(s): INR, PROTIME in the last 168 hours.  Cardiac Enzymes: No results for input(s): CKTOTAL, CKMB, CKMBINDEX, TROPONINI in the last 168 hours.  BNP (last 3 results) No results for input(s): PROBNP in the last 8760 hours.  HbA1C: No results for input(s): HGBA1C in the last 72 hours.  CBG: Recent Labs  Lab 09/26/17 0618  09/26/17 1237 09/26/17 1727 09/26/17 2305 09/27/17 0639  GLUCAP 126* 134* 134* 131* 137*    Lipid Profile: Recent Labs    09/27/17 0449  TRIG 465*    Thyroid Function Tests: Recent Labs    09/27/17 0449  TSH 4.372    Anemia Panel: No results for input(s): VITAMINB12, FOLATE, FERRITIN, TIBC, IRON, RETICCTPCT in the last 72 hours.  Urine analysis:    Component Value Date/Time   COLORURINE AMBER (A) 09/25/2017 1120   APPEARANCEUR CLOUDY (A) 09/25/2017 1120   LABSPEC 1.019 09/25/2017 1120   PHURINE 5.0 09/25/2017 1120   GLUCOSEU NEGATIVE 09/25/2017 1120   HGBUR LARGE (A) 09/25/2017 1120   BILIRUBINUR NEGATIVE 09/25/2017 1120   KETONESUR NEGATIVE 09/25/2017 1120   PROTEINUR 30 (A) 09/25/2017 1120   NITRITE NEGATIVE 09/25/2017 1120   LEUKOCYTESUR TRACE (A) 09/25/2017 1120    Sepsis Labs: Lactic Acid, Venous    Component Value Date/Time   LATICACIDVEN 0.9 09/14/2017 2343    MICROBIOLOGY: Recent Results (from the past 240 hour(s))  Surgical PCR screen     Status: None   Collection Time: 09/23/17 12:03 AM  Result Value Ref Range Status   MRSA, PCR NEGATIVE NEGATIVE Final   Staphylococcus aureus NEGATIVE NEGATIVE Final    Comment: (NOTE) The Xpert SA Assay (FDA approved for NASAL specimens in patients 64 years of age and older), is one component of a comprehensive surveillance program. It is not intended to diagnose infection nor to guide or monitor treatment.   Culture, Urine     Status: None   Collection Time: 09/25/17 11:30 AM  Result Value Ref Range Status   Specimen Description URINE, RANDOM  Final   Special Requests NONE  Final   Culture   Final    NO GROWTH Performed at [facility], [address], [place], [place] [address]    Report Status 09/26/2017 FINAL  Final    RADIOLOGY STUDIES/RESULTS: Dg Abd 1 View  Result Date: 09/15/2017 CLINICAL DATA:  Nasogastric tube placement.  Pelvic malignancy. EXAM: ABDOMEN - 1 VIEW COMPARISON:   Radiographs and CT 09/14/2017. FINDINGS: 1537 hours. Enteric tube projects below the diaphragm with tip in the left subphrenic region, likely in the gastric fundus. There is a double-J left ureteral stent. Mild bowel wall thickening is present in the left mid abdomen. No supine evidence of free intraperitoneal air. Right pelvic calcifications consistent with phleboliths are stable. IMPRESSION: Enteric tube projects over the left upper quadrant of the  abdomen, likely in the gastric fundus. Electronically Signed   By: Richardean Sale M.D.   On: 09/15/2017 16:11   Ct Abdomen Pelvis W Contrast  Result Date: 09/14/2017 CLINICAL DATA:  Abdominal swelling and vomiting. Increasing weakness. Patient is recently been diagnosed with cervical cancer. Paracentesis 1 day ago and last week. EXAM: CT ABDOMEN AND PELVIS WITH CONTRAST TECHNIQUE: Multidetector CT imaging of the abdomen and pelvis was performed using the standard protocol following bolus administration of intravenous contrast. CONTRAST:  41mL ISOVUE-300 IOPAMIDOL (ISOVUE-300) INJECTION 61% COMPARISON:  09/03/2017 FINDINGS: Lower chest: Small bilateral pleural effusions, greater on the right. Mild basilar atelectasis is likely compressive. Moderate-sized esophageal hiatal hernia. Hepatobiliary: 1 cm diameter hypo dense lesion in segment 4 of the liver may represent a metastatic focus. No other focal liver lesions identified. Gallbladder and bile ducts are unremarkable. Pancreas: Pancreas is atrophic. No focal lesion or inflammatory changes appreciated. Spleen: Spleen size is normal.  No focal lesions. Adrenals/Urinary Tract: Right adrenal gland nodule measuring 2.2 cm in diameter. Hounsfield unit measurements on portal venous and delayed phase imaging demonstrate a relative washout of 55%. Relative washout of 40% or greater suggests benign adenoma. Renal nephrograms are homogeneous. Mildly dilated left intrarenal collecting system. A left ureteral stent is in place.  Bladder is unremarkable. Stomach/Bowel: Stomach is not abnormally distended. There is diffuse dilatation of fluid-filled small bowel with decompressed terminal ileum. This is progressing since the previous study. Transition zone appears to be in the right lower quadrant. Appearance is consistent with small bowel obstruction. Cause of obstruction may be extrinsic compression of the small bowel, metastasis, or direct invasion. Colon is decompressed. Small amount of residual contrast material in the colon. Appendix is normal. Vascular/Lymphatic: No significant vascular findings are present. No enlarged abdominal or pelvic lymph nodes. Reproductive: Large heterogeneous pelvic mass extending above the uterus. This likely arises from the right ovary and appears to demonstrate direct invasion of the uterine fundus on the left. The mass measures about 6.7 by 10.2 x 10.7 cm. Other: Right lower quadrant mesenteric mass measuring 4.4 cm in diameter. Diffuse nodular infiltration throughout the omentum and mesenteric fat consistent with diffuse peritoneal metastasis. Small amount of free fluid throughout the abdomen and pelvis. No free air. Abdominal wall musculature appears intact. Musculoskeletal: Mild degenerative changes in the spine. Heterogeneous appearance of the proximal left femur with trabecular coarsening. Appearance is most consistent with Paget's disease although metastatic lesion could also have this appearance. IMPRESSION: 1. Large heterogeneous pelvic mass lesion consistent with malignancy, likely ovarian in origin. Probable direct invasion of the uterus. 2. Diffuse peritoneal carcinomatosis with nodular infiltration of the omentum and mesentery and diffuse peritoneal fluid. Right lower quadrant mass measuring 4.4 cm diameter, likely metastatic. 3. Progressing small bowel obstruction with diffusely dilated fluid-filled small bowel. Transition zone is in the right lower quadrant. 4. Right adrenal gland nodule has  characteristics suggesting a benign adenoma. 5. Bone changes in the proximal left femur probably represent Paget's disease although bone metastasis is not excluded. Consider bone scan for further evaluation if clinically indicated. 6. Left 3 atrial stent in place with mild residual hydronephrosis. 7. 1 cm low-attenuation lesion in the left lobe of the liver suspicious for metastasis. Electronically Signed   By: Lucienne Capers M.D.   On: 09/14/2017 19:29   Ct Abdomen Pelvis W Contrast  Result Date: 09/03/2017 CLINICAL DATA:  New onset ascites, epigastric pain, nausea, and vomiting for 3 days, abdominal distension swelling, recent UTI EXAM: CT ABDOMEN AND  PELVIS WITH CONTRAST TECHNIQUE: Multidetector CT imaging of the abdomen and pelvis was performed using the standard protocol following bolus administration of intravenous contrast. Sagittal and coronal MPR images reconstructed from axial data set. CONTRAST:  100 ML ISOVUE-300 IOPAMIDOL (ISOVUE-300) INJECTION 61% IV. No oral contrast given. COMPARISON:  None FINDINGS: Lower chest: Tiny BILATERAL pleural effusions larger on RIGHT. Subsegmental atelectasis at LEFT base. Hepatobiliary: Dependent density within gallbladder question calculi or sludge. Liver normal appearance. No biliary dilatation. Pancreas: Normal appearance Spleen: Normal appearance Adrenals/Urinary Tract: RIGHT adrenal mass 22 x 19 mm demonstrating significant washout on delayed images consistent with adenoma. LEFT adrenal gland unremarkable. LEFT hydronephrosis and hydroureter present. No RIGHT-side urinary tract dilatation. LEFT ureteral dilatation terminates at a mass in the pelvis see below. Inhomogeneous LEFT nephrogram question pyelonephritis. No renal mass lesions. Bladder decompressed. Stomach/Bowel: Appendix not localized. Colon decompressed though displaced medially especially RIGHT by ascites. Moderate to large hiatal hernia. Decompressed proximal and minimally dilated distal small  bowel loops suggesting mild degree of small bowel obstruction. Transition zone appears to be within the RIGHT pelvis at the level of the distal ileum. No definite bowel wall thickening Vascular/Lymphatic: Aorta normal caliber.  No definite adenopathy. Reproductive: Irregular heterogeneous nodular appearing soft tissue masses in the adnexa bilaterally with nodular areas of peripheral irregular enhancement compatible with ovarian neoplasm. It is uncertain whether this represents a single large multilobulated mass which extends across the midline in both adnexal regions or contiguous BILATERAL ovarian masses. The larger mass portion to the RIGHT of midline measures 10.6 x 4.9 x 9.8 cm and the smaller component to the LEFT of midline measures 8.2 x 4.9 x 4.8 cm. Tumor masses are contiguous with the uterus. Mildly complicated cystic lesion at the LEFT lateral aspect of the uterus 3.1 x 2.4 cm image 74 could be of uterine or ovarian origin. Other: Large amount of ascites. Fluid in lesser sac. Diffuse omental caking by tumor. No free air. No hernia. Musculoskeletal: No acute osseous findings. Prominent epidural vessels within the lumbar spinal canal. 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 Associated ascites and omental caking consistent with peritoneal carcinomatosis. Suspect mild distal small bowel obstruction. Distal LEFT ureteral obstruction with LEFT hydronephrosis and hydroureter. Patchy LEFT nephrogram most likely representing pyelonephritis recommend correlation with urinalysis. Sludge versus dependent calculi within gallbladder. Large hiatal hernia. Tiny BILATERAL pleural effusions. Findings called to Dr. Broadus John on 09/03/2017 at 1741 hours.  Electronically Signed   By: Lavonia Dana M.D.   On: 09/03/2017 17:48   US Paracentesis  Result Date: 09/04/2017 INDICATION: Pelvic mass, carcinomatosis and large volume ascites. EXAM: ULTRASOUND GUIDED PARACENTESIS MEDICATIONS: None. COMPLICATIONS: None immediate. PROCEDURE: Informed written consent was obtained from the patient after a discussion of the risks, benefits and alternatives to treatment. A timeout was performed prior to the initiation of the procedure. Initial ultrasound was performed to localize ascites. The right lower abdomen was prepped and draped in the usual sterile fashion. 1% lidocaine was used for local anesthesia. Following this, a 6 Fr Safe-T-Centesis catheter was introduced. An ultrasound image was saved for documentation purposes. The paracentesis was performed. The catheter was removed and a dressing was applied. The patient tolerated the procedure well without immediate post procedural complication. FINDINGS: A total  of approximately 4.8 L of amber, slightly blood tinged fluid was removed. Samples were sent to the laboratory as requested by the clinical team. IMPRESSION: Successful ultrasound-guided paracentesis yielding 4.8 liters of peritoneal fluid. Electronically Signed   By: Aletta Edouard M.D.   On: 09/04/2017 13:31   Ct Biopsy  Result Date: 09/17/2017 CLINICAL DATA:  Pelvic mass, diffuse peritoneal carcinomatosis and ascites. Prior cytology of peritoneal fluid was not conclusive for gynecologic malignancy and suggested potential GI origin. Request has now been made to perform biopsy of peritoneal tumor for more definitive diagnosis. EXAM: CT GUIDED CORE BIOPSY OF PERITONEAL MASS COMPARISON:  CT of the abdomen and pelvis on 09/14/2017 and 09/03/2017 ANESTHESIA/SEDATION: 2.0 mg IV Versed; 100 mcg IV Fentanyl Total Moderate Sedation Time:  20 minutes. The patient's level of consciousness and physiologic status were continuously monitored during the procedure by Radiology  nursing. PROCEDURE: The procedure risks, benefits, and alternatives were explained to the patient. Questions regarding the procedure were encouraged and answered. The patient understands and consents to the procedure. The abdominal wall was prepped with chlorhexidine in a sterile fashion, and a sterile drape was applied covering the operative field. A sterile gown and sterile gloves were used for the procedure. Local anesthesia was provided with 1% Lidocaine. CT was performed in a supine position. Under CT guidance, a 30 gauge trocar needle was advanced from an anterior oblique position into the left anterior peritoneal cavity. Core biopsy was performed with an 18 gauge core biopsy needle device. A total of 4 samples were obtained and submitted in formalin. Additional CT images were obtained after outer needle removal. COMPLICATIONS: None FINDINGS: Small bowel shows decompression since prior CT on 09/14/2017 after nasogastric decompression. This now allows for a safe window to approach of peritoneal tumor in the anterior peritoneal cavity/omental region. Tumor just deep to the left anterior abdominal wall was targeted and revealed solid tissue with biopsy. IMPRESSION: CT-guided core biopsy performed of peritoneal tumor. Electronically Signed   By: Aletta Edouard M.D.   On: 09/17/2017 16:26   Dg Chest Port 1 View  Result Date: 09/25/2017 CLINICAL DATA:  Leukocytosis and status post laparotomy and bowel bypass for colon carcinoma. EXAM: PORTABLE CHEST 1 VIEW COMPARISON:  08/30/2017 FINDINGS: Right arm PICC line extends to the distal SVC. The heart size and mediastinal contours are within normal limits. Lung volumes are low with bibasilar atelectasis present. There may be small bilateral pleural effusions. There is no evidence of pulmonary edema, consolidation, pneumothorax or nodules. The visualized skeletal structures are unremarkable. IMPRESSION: Low lung volumes with bibasilar atelectasis. Possible small  bilateral pleural effusions. Electronically Signed   By: Aletta Edouard M.D.   On: 09/25/2017 10:39   Dg Abd 2 Views  Result Date: 09/21/2017 CLINICAL DATA:  Follow-up small bowel obstruction. Clinically improved. EXAM: ABDOMEN - 2 VIEW COMPARISON:  Abdominal radiograph of January 2nd 2019 and CT scan of the abdomen of September 17, 2017. FINDINGS: There remain loops of mildly distended gas-filled small bowel to the right of midline and in the mid abdomen. There is a gas within the transverse colon and a small amount of gas within the rectum. There is gas within a hiatal hernia. The esophagogastric tube tip and proximal port project below the hemidiaphragms. No free extraluminal gas collections are observed. A double pigtail stent is present in the left kidney and ureter extending into the bladder. IMPRESSION: Persistent partial distal small bowel obstruction. No evidence of perforation. Hiatal hernia.  The esophagogastric tube is  in reasonable position. Electronically Signed   By: David  Martinique M.D.   On: 09/21/2017 13:12   Dg Abd Acute W/chest  Result Date: 09/14/2017 CLINICAL DATA:  Vomiting. EXAM: DG ABDOMEN ACUTE W/ 1V CHEST COMPARISON:  08/26/2017 FINDINGS: Left-sided nephroureteral stent is identified. Small bowel air-fluid levels are identified. Small bowel loops are mildly increased in caliber measuring up to 2.6 cm. There is no evidence of free intraperitoneal air. Heart size and mediastinal contours are within normal limits. There are bilateral pleural effusions noted right greater than left. Both lungs are clear. IMPRESSION: 1. Mildly increased caliber of small bowel loops with air-fluid levels. Cannot rule out low grade partial obstruction. 2. Status post left ureteral stenting. 3. Bilateral pleural effusions. Electronically Signed   By: Kerby Moors M.D.   On: 09/14/2017 16:58   Dg C-arm 1-60 Min-no Report  Result Date: 09/05/2017 Fluoroscopy was utilized by the requesting physician.  No  radiographic interpretation.   US Abdomen Limited Ruq  Result Date: 09/03/2017 CLINICAL DATA:  Pancreatitis EXAM: ULTRASOUND ABDOMEN LIMITED RIGHT UPPER QUADRANT COMPARISON:  None. FINDINGS: Gallbladder: Sludge in the gallbladder. Normal wall thickness. Negative sonographic Murphy. Common bile duct: Diameter: 2.5 mm Liver: Parenchymal echogenicity within normal limits. On some of the images, nodular liver contour is suggested. Portal vein is patent on color Doppler imaging with normal direction of blood flow towards the liver. The right pleural effusion is incidentally noted. There is moderate ascites in the right upper quadrant. IMPRESSION: 1. Sludge in the gallbladder. Negative for wall thickening, tenderness, or biliary dilatation 2. Right pleural effusion and moderate ascites in the right upper quadrant 3. On some of the images, liver contour appears slightly nodular, query hepatocellular disease. Electronically Signed   By: Donavan Foil M.D.   On: 09/03/2017 14:56   Ir Paracentesis  Result Date: 09/13/2017 INDICATION: Patient with recurrent ascites. Request is made for diagnostic and therapeutic paracentesis. EXAM: ULTRASOUND GUIDED DIAGNOSTIC AND THERAPEUTIC PARACENTESIS MEDICATIONS: 10 mL 2% lidocaine COMPLICATIONS: None immediate. PROCEDURE: Informed written consent was obtained from the patient after a discussion of the risks, benefits and alternatives to treatment. A timeout was performed prior to the initiation of the procedure. Initial ultrasound scanning demonstrates a large amount of ascites within the left lateral abdomen. The left lateral abdomen was prepped and draped in the usual sterile fashion. 2% lidocaine was used for local anesthesia. Following this, a 19 gauge, 7-cm, Yueh catheter was introduced. An ultrasound image was saved for documentation purposes. The paracentesis was performed. The catheter was removed and a dressing was applied. The patient tolerated the procedure well  without immediate post procedural complication. FINDINGS: A total of approximately 2.0 liters of red-colored fluid was removed. Samples were sent to the laboratory as requested by the clinical team. IMPRESSION: Successful ultrasound-guided paracentesis yielding 2.0 liters of peritoneal fluid. Read by:  Brynda Greathouse PA-C Electronically Signed   By: Jerilynn Mages.  Shick M.D.   On: 09/13/2017 15:21     LOS: 14 days   Oren Binet, MD  [facility] Pager:[phone]  If 7PM-7AM, please contact night-coverage [email or web address] Password [facility] 09/29/2017, 12:06 PM

## 2017-09-30 LAB — BASIC METABOLIC PANEL
ANION GAP: 11 (ref 5–15)
BUN: 58 mg/dL — ABNORMAL HIGH (ref 6–20)
CO2: 27 mmol/L (ref 22–32)
Calcium: 8.1 mg/dL — ABNORMAL LOW (ref 8.9–10.3)
Chloride: 99 mmol/L — ABNORMAL LOW (ref 101–111)
Creatinine, Ser: 1.7 mg/dL — ABNORMAL HIGH (ref 0.44–1.00)
GFR calc Af Amer: 36 mL/min — ABNORMAL LOW (ref >60)
GFR, EST NON AFRICAN AMERICAN: 31 mL/min — AB (ref >60)
Glucose, Bld: 107 mg/dL — ABNORMAL HIGH (ref 65–99)
POTASSIUM: 3.7 mmol/L (ref 3.5–5.1)
Sodium: 137 mmol/L (ref 135–145)

## 2017-09-30 LAB — COMPREHENSIVE METABOLIC PANEL
ALBUMIN: 1.4 g/dL — AB (ref 3.5–5.0)
ALT: 72 U/L — ABNORMAL HIGH (ref 14–54)
AST: 64 U/L — AB (ref 15–41)
Alkaline Phosphatase: 226 U/L — ABNORMAL HIGH (ref 38–126)
Anion gap: 10 (ref 5–15)
BUN: 58 mg/dL — AB (ref 6–20)
CHLORIDE: 98 mmol/L — AB (ref 101–111)
CO2: 29 mmol/L (ref 22–32)
Calcium: 8 mg/dL — ABNORMAL LOW (ref 8.9–10.3)
Creatinine, Ser: 1.73 mg/dL — ABNORMAL HIGH (ref 0.44–1.00)
GFR calc non Af Amer: 30 mL/min — ABNORMAL LOW (ref >60)
GFR, EST AFRICAN AMERICAN: 35 mL/min — AB (ref >60)
Glucose, Bld: 109 mg/dL — ABNORMAL HIGH (ref 65–99)
Potassium: 3.7 mmol/L (ref 3.5–5.1)
SODIUM: 137 mmol/L (ref 135–145)
Total Bilirubin: 2.3 mg/dL — ABNORMAL HIGH (ref 0.3–1.2)
Total Protein: 5.6 g/dL — ABNORMAL LOW (ref 6.5–8.1)

## 2017-09-30 LAB — MAGNESIUM: Magnesium: 2.7 mg/dL — ABNORMAL HIGH (ref 1.7–2.4)

## 2017-09-30 LAB — PHOSPHORUS: PHOSPHORUS: 5 mg/dL — AB (ref 2.5–4.6)

## 2017-09-30 MED ORDER — POTASSIUM CHLORIDE CRYS ER 20 MEQ PO TBCR
40.0000 meq | EXTENDED_RELEASE_TABLET | Freq: Once | ORAL | Status: AC
  Administered 2017-09-30: 40 meq via ORAL
  Filled 2017-09-30: qty 2

## 2017-09-30 MED ORDER — MORPHINE SULFATE (CONCENTRATE) 10 MG/0.5ML PO SOLN
5.0000 mg | ORAL | Status: DC | PRN
  Administered 2017-09-30 (×2): 10 mg via SUBLINGUAL
  Administered 2017-10-05: 5 mg via SUBLINGUAL
  Filled 2017-09-30 (×3): qty 0.5

## 2017-09-30 MED ORDER — MORPHINE SULFATE (PF) 4 MG/ML IV SOLN
1.0000 mg | INTRAVENOUS | Status: DC | PRN
Start: 2017-09-30 — End: 2017-10-05
  Administered 2017-10-05: 1 mg via INTRAVENOUS
  Filled 2017-09-30 (×2): qty 1

## 2017-09-30 MED ORDER — TRACE MINERALS CR-CU-MN-SE-ZN 10-1000-500-60 MCG/ML IV SOLN
INTRAVENOUS | Status: AC
  Administered 2017-09-30: 17:00:00 via INTRAVENOUS
  Filled 2017-09-30: qty 960

## 2017-09-30 NOTE — Progress Notes (Signed)
PT Cancellation Note  Patient Details Name: Christie Maynard MRN: [id number] DOB: 1956/06/20   Cancelled Treatment:    Reason Eval/Treat Not Completed: Fatigue/lethargy limiting ability to participate(pt just returned to bed, is fatigued. Will attempt tomorrow. )   Philomena Doheny 09/30/2017, 1:51 PM [phone]

## 2017-09-30 NOTE — Progress Notes (Addendum)
PHARMACY - ADULT TOTAL PARENTERAL NUTRITION CONSULT NOTE & ANTIBIOTIC NOTE  Pharmacy Consult for TPN  Indication: bowel obstruction  Patient Measurements: Height: 5' 2"  (157.5 cm) Weight: 139 lb (63 kg) IBW/kg (Calculated) : 50.1 TPN AdjBW (KG): 53.3 Body mass index is 25.42 kg/m. 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 CT abdomen/pelviswith contrast shows progressing SBO. Having BMs but with 1450 ml from NGT, Surgery recommends starting TPN. Patient with significant recent weight loss but primarily d/t multiple large-volume paracentesis. Poor intake since week before Xmas, and unable to keep anything down since 12/31. Appears to be at risk for refeeding.  Significant events:  1/9 endoscopy/colonoscopy showed mass in cecum 1/10:  Exp lap with palliative small bowel to right colon bypass with placement of gastrostomy 1/14 continue g tube to drain and allow to drink for comfort, tolerating clear liquids; lipids stopped 1/15 take lytes out of TPN, rate to 1/2 per [facility] 2nd elevated LFTs.  Per [facility] notes, GI tract not functioning at this time. GI function may never return due to tumor burden, malignant SBO, with nothing left to do surgically. TPN is not indicated in this situation but has not been addressed with family.   Recent Labs    09/29/17 0303 09/30/17 0705  NA 136 137  137  K 4.1 3.7  3.7  CL 99* 98*  99*  CO2 27 29  27   GLUCOSE  101* 109*  107*  BUN 57* 58*  58*  CREATININE 1.45* 1.73*  1.70*  CALCIUM 8.2* 8.0*  8.1*  PHOS 5.9* 5.0*  MG 2.5* 2.7*  ALBUMIN 1.4* 1.4*  ALKPHOS 186* 226*  AST 63* 64*  ALT 74* 72*  BILITOT 2.9* 2.3*   Today, 09/30/2017:   Glucose (goal <150):  DC SSI/CBGs 1/14  Electrolytes: removed from TPN formula 1/15  K 3.7 (decreased)   Phos elevated at 5.0 but improved   CoCa 10.08 (CaxPhos ~ 50.4 - goal < 55)  Mg elevated at 2.7 (unchanged)  Na 137 (improved)   Renal: SCr up to 1.73, given lasix 40 on 1/14, 73m 1/15;  drains out 3400 ml, 1 BM recorded 1/13 In Epic  but pt denies having BM  LFTs: slightly elevated on admit, increased 1/14, AST/ALT unchanged overnight 64/72, Alk phos increased 226, Tbili elevated at 2.3  TGs: elevated 180 (1/7) 465 (1/14)  Prealbumin: 8.4 (1.7) and 6.1 on 1/14  NUTRITIONAL GOALS  RD recs (09/20/17):  Kcal:  1700-1900 Protein:  80-90 grams Fluid:  >/= 1.7 L/d  Clinimix-E 5/15 at a goal rate of 75 ml/hr + 20% fat emulsion 240 ml / day to provide: 90 g/day protein, 1758 Kcal/day.  PLAN                                                                                  Now: KDur 50mq PO x 1 to prevent further drop in K+  Roday, at 1800 today:   Continue Clinimix (no electrolytes) 5/15 at 40 ml/hr  20% fat emulsion was stopped Monday 1/14  2nd Trig > 400 (465 1/14)   This provides 682 kcal and 48 gm protein for 40% kcal and 60 % protein needs  TPN to contain standard multivitamins and trace elements  IVF per MD  TPN lab panels on Mondays & Thursdays  F/u daily  Per discussion with [facility], TPN to continue until goals of care established  EPeggyann Juba PharmD, BCPS Pager: [phone]/17/2019 9:04 AM

## 2017-09-30 NOTE — Progress Notes (Signed)
7 Days Post-Op    XJ:OITGPQ and vomiting with peritoneal carcinomatosis.  Subjective: No real change, no BS, no flatus, No BM POD 7. Pain is pretty well controlled by the Morphine concentrate.  She is not using her PCA much 1.5 mg recorded yesterday.Midiline incision looks good.   Objective: Vital signs in last 24 hours: Temp:  [97.5 F (36.4 C)-98.4 F (36.9 C)] 98.4 F (36.9 C) (01/17 0432) Pulse Rate:  [92-109] 109 (01/17 0432) Resp:  [21-27] 27 (01/17 0400) BP: (119-140)/(39-82) 140/39 (01/17 0432) SpO2:  [96 %-100 %] 98 % (01/17 0432) Last BM Date: (preop)  570 PO 1040 IV 550 urine Drain 3400 - gastrostomy tube Afebrile, VSS LFT's remain elevated, creatinine is slowly rising, mag up and lytes are out of the TNA  Intake/Output from previous day: 01/16 0701 - 01/17 0700 In: 1760.3 [P.O.:570; I.V.:1040.3; IV Piggyback:150] Out: 3950 [Urine:550; Drains:3400] Intake/Output this shift: No intake/output data recorded.  General appearance: alert, cooperative and no distress Resp: decreased BS at the base, more so on the right.  Clear above GI: NO BS, No flatus, she is sitting up and may be a little distended.  Midline incision looks good.  Lab Results:  Recent Labs    09/28/17 0523 09/29/17 0303  WBC 16.2* 15.2*  HGB 10.4* 9.7*  HCT 30.5* 28.8*  PLT 360 402*    BMET Recent Labs    09/29/17 0303 09/30/17 0705  NA 136 137  137  K 4.1 3.7  3.7  CL 99* 98*  99*  CO2 27 29  27   GLUCOSE 101* 109*  107*  BUN 57* 58*  58*  CREATININE 1.45* 1.73*  1.70*  CALCIUM 8.2* 8.0*  8.1*   PT/INR No results for input(s): LABPROT, INR in the last 72 hours.  Recent Labs  Lab 09/27/17 0449 09/28/17 1052 09/29/17 0303 09/30/17 0705  AST 136* 67* 63* 64*  ALT 116* 83* 74* 72*  ALKPHOS 170* 174* 186* 226*  BILITOT 1.8* 2.5* 2.9* 2.3*  PROT 5.1* 5.3* 5.5* 5.6*  ALBUMIN 1.4* 1.3* 1.4* 1.4*     Lipase     Component Value Date/Time   LIPASE 69 (H) 09/14/2017  1149     Medications: . enoxaparin (LOVENOX) injection  40 mg Subcutaneous Q24H  . feeding supplement  1 Container Oral TID BM  . morphine   Intravenous Q4H  . phenytoin (DILANTIN) IV  100 mg Intravenous QHS   . sodium chloride 10 mL/hr at 09/25/17 1214  . piperacillin-tazobactam (ZOSYN)  IV 3.375 g (09/30/17 0448)  . TPN (CLINIMIX) Adult without lytes 40 mL/hr at 09/29/17 1717    Assessment/Plan SBO/cecal mass Metastatic colon cancer, carcinomatosis S/pEXPLORATORY LAPAROTOMY WITH PALLIATIVE SMALL BOWEL TO RIGHT COLON BY PASS WITH PLACEMENT OF GASTROSTOMY TUBE1/10 Dr. Marlou Starks - POD7 -G tube to gravity, clear liquids for comfort - WBC 15.2, slowly trending down, afebrile. CXR 1/12 and Ucx 1/12 negative. Continue zosynand monitor DIY64.1 -Ambulate/IS  Elevated creatinine-1.45 today, good urine output Elevated LFTs - likely 2/2 TNA  ID -zosyn 1/12 - 1/16, cefotetan 1/10>>1/11 FEN -TPN @ 40 ml/hr, clear liquids, Boost VTE -lovenox Foley -none Follow up -Dr. Marlou Starks   Plan:  Stop the PCA, TNA still ongoing,with slowly rising ALK Phos, other LFT's are stable but elevated.  Creatinine is rising.  IV intake is less than volume out the gastrostomy tube.  Pain is fairly well controlled with the SL morphine.  I will stop the PCA.  Discuss with Dr. Dalbert Batman. DR Sloan Leiter  has stopped the Zosyn and is going to talk with Palliative.       LOS: 15 days    Trindon Dorton 09/30/2017 [phone]

## 2017-09-30 NOTE — Progress Notes (Signed)
PROGRESS NOTE        PATIENT DETAILS Name: Christie Maynard Age: 62 y.o. Sex: female Date of Birth: 07/26/1956 Admit Date: 09/14/2017 Admitting Physician A Melven Sartorius., MD ELF:[id number], Baldemar Friday., PA-C  Brief Narrative: Patient is a 62 y.o. 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 She was subsequently diagnosed with small bowel obstruction, further evaluation with a colonoscopy revealed a cecal mass. She unfortunately appears to have metastatic colon cancer (and not ovarian cancer). She underwent a exploratory laparotomy with palliative small bowel to right colon bypass and placement of a gastrostomy tube on 1/10. Postoperatively, she she has developed a prolonged ileus and anasarca/volume overload.See below for further details.   Subjective: Remain stable without any major issues overnight-she apparently walked quite a bit yesterday.  No flatus or BM yet.   Assessment/Plan: Principal Problem: SBO (small bowel obstruction) likely secondary to a cecal mass: Underwent exploratory laparotomy, and palliative small bowel to right colon bypass with placement of a gastrostomy tube on 1/10.  She continues to have a ileus postoperatively-without any BM or flatus yet.  Reviewed general surgery note from yesterday-not uncommon to have ileus in the setting of significant malignancy-and apparently may not get any better.  Continue supportive care, but I think a palliative care evaluation to start discussing goals of care is a warranted at this time.  Have talked to patient, will continue with current treatment plan-and have palliative care evaluation to begin discussing goals of care.  Metastatic colon cancer/peritoneal carcinomatosis: She was initially following with Dr. 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 Medical oncology (Dr. Benay Spice) following.  L ureteral obstruction s/p stent placement:Creatinine stable.  Outpatient follow-up.    Leukocytosis: I suspect this is mostly reactive-probably secondary to inflammation from peritoneal carcinomatosis-recent surgery. Do not see any foci of infection apparent-spoke with general surgery-okay to stop Zosyn at this time and monitor off antimicrobial therapy.  Anemia: Likely secondary to acute illness on top of chronic disease. Hemoglobin stable. Continue to follow periodically.  Acute kidney injury: Creatinine continues to worsen-she remains volume overloaded-hopefully creatinine will plateau soon and improved.  Was given Lasix a few days back-we will continue to hold Lasix for now.  days.  Anasarca/lower extremity edema: Secondary to hypoalbuminemia/critical illness-continues to have significant lower extremity edema.  See plans above to continue to hold diuretics for now.    Transaminitis: Remain stable-slightly increase in alkaline phosphatase-but that could be from TNA.  Seizure disorder: Continue IV Dilantin-with plans to transition to oral route when she is able to tolerate more by mouth intake.  DVT Prophylaxis: Prophylactic Lovenox   Code Status: Full code   Family Communication: None at bedside  Disposition Plan: Remain inpatient  Antimicrobial agents: Anti-infectives (From admission, onward)   Start     Dose/Rate Route Frequency Ordered Stop   09/25/17 1200  piperacillin-tazobactam (ZOSYN) IVPB 3.375 g  Status:  Discontinued  3.375 g 12.5 mL/hr over 240 Minutes Intravenous  Every 8 hours 09/25/17 1054 09/30/17 0852   09/23/17 1249  cefoTEtan in Dextrose 5% (CEFOTAN) 2-2.08 GM-%(50ML) IVPB    Comments:  [name]   : cabinet override      09/23/17 1249 09/24/17 0059   09/23/17 1100  cefoTEtan (CEFOTAN) 2 g in dextrose 5 % 50 mL IVPB     2 g 100 mL/hr over 30 Minutes Intravenous  Once 09/22/17 1549 09/23/17 1333      Procedures:  CT-guided core biopsy of peritoneal tumor per Dr. Kathlene Cote 09/17/2017  Endoscopy per Dr. Cristina Gong 09/22/2016  Colonoscopy per Dr. Cristina Gong 09/22/2017  Exploratory laparotomy with palliative small bowel to right colon bypass with placement of gastrostomy tube per Dr. Marlou Starks 09/23/2017  CONSULTS:  Oncology   Gastroenterology   General surgery   GYN oncology  Palliative care   Time spent: 25- minutes-Greater than 50% of this time was spent in counseling, explanation of diagnosis, planning of further management, and coordination of care.  MEDICATIONS: Scheduled Meds: . enoxaparin (LOVENOX) injection  40 mg Subcutaneous Q24H  . feeding supplement  1 Container Oral TID BM  . phenytoin (DILANTIN) IV  100 mg Intravenous QHS   Continuous Infusions: . sodium chloride 10 mL/hr at 09/25/17 1214  . TPN (CLINIMIX) Adult without lytes 40 mL/hr at 09/29/17 1717  . TPN (CLINIMIX) Adult without lytes     PRN Meds:.morphine CONCENTRATE, ondansetron **OR** ondansetron (ZOFRAN) IV, sodium chloride flush   PHYSICAL EXAM: Vital signs: Vitals:   09/30/17 0000 09/30/17 0037 09/30/17 0400 09/30/17 0432  BP:  119/80  (!) 140/39  Pulse:  92  (!) 109  Resp: (!) 25  (!) 27   Temp:  (!) 97.5 F (36.4 C)  98.4 F (36.9 C)  TempSrc:  Oral    SpO2: 100% 98% 96% 98%  Weight:      Height:       Filed Weights   09/14/17 1127 09/14/17 2110 09/22/17 1217  Weight: 68 kg (150 lb) 63.4 kg (139 lb 12.8 oz) 63 kg (139 lb)   Body mass index is 25.42 kg/m.   General appearance :Awake, alert, not in any distress.  Eyes:, pupils equally  reactive to light and accomodation,no scleral icterus. HEENT: Atraumatic and Normocephalic Neck: supple, no JVD. Resp:Good air entry bilaterally, no rales or rhonchi CVS: S1 S2 regular, no murmurs.  GI: Bowel sounds present, Non tender and not distended with no gaurding, rigidity or rebound. Extremities: B/L Lower Ext shows ++ edema, both legs are warm to touch Neurology:  speech clear,Non focal, sensation is grossly intact. Psychiatric: Normal judgment and insight. Normal mood. Musculoskeletal:No digital cyanosis Skin:No Rash, warm and dry Wounds:N/A  I have personally reviewed following labs and imaging studies  LABORATORY DATA: CBC: Recent Labs  Lab 09/24/17 0403 09/25/17 0518 09/26/17 0402 09/27/17 0449 09/28/17 0523 09/29/17 0303  WBC 18.7* 22.3* 17.8* 16.3* 16.2* 15.2*  NEUTROABS 14.5*  --  14.3* 13.4*  --   --   HGB 14.0 12.1 11.3* 11.1* 10.4* 9.7*  HCT 43.0 37.6 34.5* 33.5* 30.5* 28.8*  MCV 88.1 87.9 86.0 85.7 84.7 84.0  PLT 296 267 316 343 360 402*    Basic Metabolic Panel: Recent Labs  Lab 09/25/17 0518 09/26/17 0402 09/27/17 0449 09/28/17 0523 09/29/17 0303 09/30/17 0705  NA 136 135 135 134* 136 137  137  K 4.8 4.4 4.6 4.5 4.1 3.7  3.7  CL 105 102 103 100* 99* 98*  99*  CO2 25 26 24 23 27 29  27   GLUCOSE 140* 137* 134* 126* 101* 109*  107*  BUN 34* 33* 35* 53* 57* 58*  58*  CREATININE 0.86 0.75 0.94 1.23* 1.45* 1.73*  1.70*  CALCIUM 7.7* 7.8* 7.8* 7.9* 8.2* 8.0*  8.1*  MG 2.1  --  2.3 2.5* 2.5* 2.7*  PHOS 3.5  --  5.1* 6.2* 5.9* 5.0*    GFR: Estimated Creatinine Clearance: 29.1 mL/min (A) (by C-G formula based on SCr of 1.73 mg/dL (H)).  Liver Function Tests: Recent Labs  Lab 09/27/17 0449 09/28/17 1052 09/29/17 0303 09/30/17 0705  AST 136* 67* 63* 64*  ALT 116* 83* 74* 72*  ALKPHOS 170* 174* 186* 226*  BILITOT 1.8* 2.5* 2.9* 2.3*  PROT 5.1* 5.3* 5.5* 5.6*  ALBUMIN 1.4* 1.3* 1.4* 1.4*   No results for input(s): LIPASE, AMYLASE  in the last 168 hours. No results for input(s): AMMONIA in the last 168 hours.  Coagulation Profile: No results for input(s): INR, PROTIME in the last 168 hours.  Cardiac Enzymes: No results for input(s): CKTOTAL, CKMB, CKMBINDEX, TROPONINI in the last 168 hours.  BNP (last 3 results) No results for input(s): PROBNP in the last 8760 hours.  HbA1C: No results for input(s): HGBA1C in the last 72 hours.  CBG: Recent Labs  Lab 09/26/17 0618 09/26/17 1237 09/26/17 1727 09/26/17 2305 09/27/17 0639  GLUCAP 126* 134* 134* 131* 137*    Lipid Profile: No results for input(s): CHOL, HDL, LDLCALC, TRIG, CHOLHDL, LDLDIRECT in the last 72 hours.  Thyroid Function Tests: No results for input(s): TSH, T4TOTAL, FREET4, T3FREE, THYROIDAB in the last 72 hours.  Anemia Panel: No results for input(s): VITAMINB12, FOLATE, FERRITIN, TIBC, IRON, RETICCTPCT in the last 72 hours.  Urine analysis:    Component Value Date/Time   COLORURINE AMBER (A) 09/25/2017 1120   APPEARANCEUR CLOUDY (A) 09/25/2017 1120   LABSPEC 1.019 09/25/2017 1120   PHURINE 5.0 09/25/2017 1120   GLUCOSEU NEGATIVE 09/25/2017 1120   HGBUR LARGE (A) 09/25/2017 1120   BILIRUBINUR NEGATIVE 09/25/2017 1120   KETONESUR NEGATIVE 09/25/2017 1120   PROTEINUR 30 (A) 09/25/2017 1120   NITRITE NEGATIVE 09/25/2017 1120   LEUKOCYTESUR TRACE (A) 09/25/2017 1120    Sepsis Labs: Lactic Acid, Venous    Component Value Date/Time   LATICACIDVEN 0.9 09/14/2017 2343    MICROBIOLOGY: Recent Results (from the past 240 hour(s))  Surgical PCR screen     Status: None   Collection Time: 09/23/17 12:03 AM  Result Value Ref Range Status   MRSA, PCR NEGATIVE NEGATIVE Final   Staphylococcus aureus NEGATIVE NEGATIVE Final    Comment: (NOTE) The Xpert SA Assay (FDA approved for NASAL specimens in patients 12 years of age and older), is one component of a comprehensive surveillance program. It is not intended to diagnose infection nor  to guide or monitor treatment.   Culture, Urine     Status: None   Collection Time: 09/25/17 11:30 AM  Result Value Ref Range Status   Specimen Description URINE, RANDOM  Final   Special Requests NONE  Final   Culture   Final    NO GROWTH Performed at [facility], [address], [place], [place] [address]    Report Status 09/26/2017 FINAL  Final    RADIOLOGY STUDIES/RESULTS: Dg Abd 1 View  Result Date: 09/15/2017 CLINICAL DATA:  Nasogastric tube placement.  Pelvic malignancy. EXAM: ABDOMEN - 1 VIEW COMPARISON:  Radiographs and CT 09/14/2017. FINDINGS:  1537 hours. Enteric tube projects below the diaphragm with tip in the left subphrenic region, likely in the gastric fundus. There is a double-J left ureteral stent. Mild bowel wall thickening is present in the left mid abdomen. No supine evidence of free intraperitoneal air. Right pelvic calcifications consistent with phleboliths are stable. IMPRESSION: Enteric tube projects over the left upper quadrant of the abdomen, likely in the gastric fundus. Electronically Signed   By: Richardean Sale M.D.   On: 09/15/2017 16:11   Ct Abdomen Pelvis W Contrast  Result Date: 09/14/2017 CLINICAL DATA:  Abdominal swelling and vomiting. Increasing weakness. Patient is recently been diagnosed with cervical cancer. Paracentesis 1 day ago and last week. EXAM: CT ABDOMEN AND PELVIS WITH CONTRAST TECHNIQUE: Multidetector CT imaging of the abdomen and pelvis was performed using the standard protocol following bolus administration of intravenous contrast. CONTRAST:  30mL ISOVUE-300 IOPAMIDOL (ISOVUE-300) INJECTION 61% COMPARISON:  09/03/2017 FINDINGS: Lower chest: Small bilateral pleural effusions, greater on the right. Mild basilar atelectasis is likely compressive. Moderate-sized esophageal hiatal hernia. Hepatobiliary: 1 cm diameter hypo dense lesion in segment 4 of the liver may represent a metastatic focus. No other focal liver lesions identified.  Gallbladder and bile ducts are unremarkable. Pancreas: Pancreas is atrophic. No focal lesion or inflammatory changes appreciated. Spleen: Spleen size is normal.  No focal lesions. Adrenals/Urinary Tract: Right adrenal gland nodule measuring 2.2 cm in diameter. Hounsfield unit measurements on portal venous and delayed phase imaging demonstrate a relative washout of 55%. Relative washout of 40% or greater suggests benign adenoma. Renal nephrograms are homogeneous. Mildly dilated left intrarenal collecting system. A left ureteral stent is in place. Bladder is unremarkable. Stomach/Bowel: Stomach is not abnormally distended. There is diffuse dilatation of fluid-filled small bowel with decompressed terminal ileum. This is progressing since the previous study. Transition zone appears to be in the right lower quadrant. Appearance is consistent with small bowel obstruction. Cause of obstruction may be extrinsic compression of the small bowel, metastasis, or direct invasion. Colon is decompressed. Small amount of residual contrast material in the colon. Appendix is normal. Vascular/Lymphatic: No significant vascular findings are present. No enlarged abdominal or pelvic lymph nodes. Reproductive: Large heterogeneous pelvic mass extending above the uterus. This likely arises from the right ovary and appears to demonstrate direct invasion of the uterine fundus on the left. The mass measures about 6.7 by 10.2 x 10.7 cm. Other: Right lower quadrant mesenteric mass measuring 4.4 cm in diameter. Diffuse nodular infiltration throughout the omentum and mesenteric fat consistent with diffuse peritoneal metastasis. Small amount of free fluid throughout the abdomen and pelvis. No free air. Abdominal wall musculature appears intact. Musculoskeletal: Mild degenerative changes in the spine. Heterogeneous appearance of the proximal left femur with trabecular coarsening. Appearance is most consistent with Paget's disease although metastatic  lesion could also have this appearance. IMPRESSION: 1. Large heterogeneous pelvic mass lesion consistent with malignancy, likely ovarian in origin. Probable direct invasion of the uterus. 2. Diffuse peritoneal carcinomatosis with nodular infiltration of the omentum and mesentery and diffuse peritoneal fluid. Right lower quadrant mass measuring 4.4 cm diameter, likely metastatic. 3. Progressing small bowel obstruction with diffusely dilated fluid-filled small bowel. Transition zone is in the right lower quadrant. 4. Right adrenal gland nodule has characteristics suggesting a benign adenoma. 5. Bone changes in the proximal left femur probably represent Paget's disease although bone metastasis is not excluded. Consider bone scan for further evaluation if clinically indicated. 6. Left 3 atrial stent in place with mild residual  hydronephrosis. 7. 1 cm low-attenuation lesion in the left lobe of the liver suspicious for metastasis. Electronically Signed   By: Lucienne Capers M.D.   On: 09/14/2017 19:29   Ct Abdomen Pelvis W Contrast  Result Date: 09/03/2017 CLINICAL DATA:  New onset ascites, epigastric pain, nausea, and vomiting for 3 days, abdominal distension swelling, recent UTI EXAM: CT ABDOMEN AND PELVIS WITH CONTRAST TECHNIQUE: Multidetector CT imaging of the abdomen and pelvis was performed using the standard protocol following bolus administration of intravenous contrast. Sagittal and coronal MPR images reconstructed from axial data set. CONTRAST:  100 ML ISOVUE-300 IOPAMIDOL (ISOVUE-300) INJECTION 61% IV. No oral contrast given. COMPARISON:  None FINDINGS: Lower chest: Tiny BILATERAL pleural effusions larger on RIGHT. Subsegmental atelectasis at LEFT base. Hepatobiliary: Dependent density within gallbladder question calculi or sludge. Liver normal appearance. No biliary dilatation. Pancreas: Normal appearance Spleen: Normal appearance Adrenals/Urinary Tract: RIGHT adrenal mass 22 x 19 mm demonstrating  significant washout on delayed images consistent with adenoma. LEFT adrenal gland unremarkable. LEFT hydronephrosis and hydroureter present. No RIGHT-side urinary tract dilatation. LEFT ureteral dilatation terminates at a mass in the pelvis see below. Inhomogeneous LEFT nephrogram question pyelonephritis. No renal mass lesions. Bladder decompressed. Stomach/Bowel: Appendix not localized. Colon decompressed though displaced medially especially RIGHT by ascites. Moderate to large hiatal hernia. Decompressed proximal and minimally dilated distal small bowel loops suggesting mild degree of small bowel obstruction. Transition zone appears to be within the RIGHT pelvis at the level of the distal ileum. No definite bowel wall thickening Vascular/Lymphatic: Aorta normal caliber.  No definite adenopathy. Reproductive: Irregular heterogeneous nodular appearing soft tissue masses in the adnexa bilaterally with nodular areas of peripheral irregular enhancement compatible with ovarian neoplasm. It is uncertain whether this represents a single large multilobulated mass which extends across the midline in both adnexal regions or contiguous BILATERAL ovarian masses. The larger mass portion to the RIGHT of midline measures 10.6 x 4.9 x 9.8 cm and the smaller component to the LEFT of midline measures 8.2 x 4.9 x 4.8 cm. Tumor masses are contiguous with the uterus. Mildly complicated cystic lesion at the LEFT lateral aspect of the uterus 3.1 x 2.4 cm image 74 could be of uterine or ovarian origin. Other: Large amount of ascites. Fluid in lesser sac. Diffuse omental caking by tumor. No free air. No hernia. Musculoskeletal: No acute osseous findings. Prominent epidural vessels within the lumbar spinal canal. 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 Associated ascites and omental caking consistent with peritoneal carcinomatosis. Suspect mild distal small bowel obstruction. Distal LEFT ureteral obstruction with LEFT hydronephrosis and hydroureter. Patchy LEFT nephrogram most likely representing pyelonephritis recommend correlation with urinalysis. Sludge versus dependent calculi within gallbladder. Large hiatal hernia. Tiny BILATERAL pleural effusions. Findings called to Dr. Broadus John on 09/03/2017 at 1741 hours. Electronically Signed   By: Lavonia Dana M.D.   On: 09/03/2017 17:48   US Paracentesis  Result Date: 09/04/2017 INDICATION: Pelvic mass, carcinomatosis and large volume ascites. EXAM: ULTRASOUND GUIDED PARACENTESIS MEDICATIONS: None. COMPLICATIONS: None immediate. PROCEDURE: Informed written consent was obtained from the patient after a discussion of the risks, benefits and alternatives to treatment. A timeout was performed prior to the initiation of the procedure. Initial ultrasound was performed to localize ascites.  The right lower abdomen was prepped and draped in the usual sterile fashion. 1% lidocaine was used for local anesthesia. Following this, a 6 Fr Safe-T-Centesis catheter was introduced. An ultrasound image was saved for documentation purposes. The paracentesis was performed. The catheter was removed and a dressing was applied. The patient tolerated the procedure well without immediate post procedural complication. FINDINGS: A total of approximately 4.8 L of amber, slightly blood tinged fluid was removed. Samples were sent to the laboratory as requested by the clinical team. IMPRESSION: Successful ultrasound-guided paracentesis yielding 4.8 liters of peritoneal fluid. Electronically Signed   By: Aletta Edouard M.D.   On: 09/04/2017 13:31   Ct Biopsy  Result Date: 09/17/2017 CLINICAL DATA:  Pelvic mass, diffuse peritoneal carcinomatosis and ascites.  Prior cytology of peritoneal fluid was not conclusive for gynecologic malignancy and suggested potential GI origin. Request has now been made to perform biopsy of peritoneal tumor for more definitive diagnosis. EXAM: CT GUIDED CORE BIOPSY OF PERITONEAL MASS COMPARISON:  CT of the abdomen and pelvis on 09/14/2017 and 09/03/2017 ANESTHESIA/SEDATION: 2.0 mg IV Versed; 100 mcg IV Fentanyl Total Moderate Sedation Time:  20 minutes. The patient's level of consciousness and physiologic status were continuously monitored during the procedure by Radiology nursing. PROCEDURE: The procedure risks, benefits, and alternatives were explained to the patient. Questions regarding the procedure were encouraged and answered. The patient understands and consents to the procedure. The abdominal wall was prepped with chlorhexidine in a sterile fashion, and a sterile drape was applied covering the operative field. A sterile gown and sterile gloves were used for the procedure. Local anesthesia was provided with 1% Lidocaine. CT was performed in a supine position. Under CT guidance, a 37 gauge trocar needle was advanced from an anterior oblique position into the left anterior peritoneal cavity. Core biopsy was performed with an 18 gauge core biopsy needle device. A total of 4 samples were obtained and submitted in formalin. Additional CT images were obtained after outer needle removal. COMPLICATIONS: None FINDINGS: Small bowel shows decompression since prior CT on 09/14/2017 after nasogastric decompression. This now allows for a safe window to approach of peritoneal tumor in the anterior peritoneal cavity/omental region. Tumor just deep to the left anterior abdominal wall was targeted and revealed solid tissue with biopsy. IMPRESSION: CT-guided core biopsy performed of peritoneal tumor. Electronically Signed   By: Aletta Edouard M.D.   On: 09/17/2017 16:26   Dg Chest Port 1 View  Result Date: 09/25/2017 CLINICAL DATA:  Leukocytosis and  status post laparotomy and bowel bypass for colon carcinoma. EXAM: PORTABLE CHEST 1 VIEW COMPARISON:  08/30/2017 FINDINGS: Right arm PICC line extends to the distal SVC. The heart size and mediastinal contours are within normal limits. Lung volumes are low with bibasilar atelectasis present. There may be small bilateral pleural effusions. There is no evidence of pulmonary edema, consolidation, pneumothorax or nodules. The visualized skeletal structures are unremarkable. IMPRESSION: Low lung volumes with bibasilar atelectasis. Possible small bilateral pleural effusions. Electronically Signed   By: Aletta Edouard M.D.   On: 09/25/2017 10:39   Dg Abd 2 Views  Result Date: 09/21/2017 CLINICAL DATA:  Follow-up small bowel obstruction. Clinically improved. EXAM: ABDOMEN - 2 VIEW COMPARISON:  Abdominal radiograph of January 2nd 2019 and CT scan of the abdomen of September 17, 2017. FINDINGS: There remain loops of mildly distended gas-filled small bowel to the right of midline and in the mid abdomen. There is a gas within the transverse colon and a small amount  of gas within the rectum. There is gas within a hiatal hernia. The esophagogastric tube tip and proximal port project below the hemidiaphragms. No free extraluminal gas collections are observed. A double pigtail stent is present in the left kidney and ureter extending into the bladder. IMPRESSION: Persistent partial distal small bowel obstruction. No evidence of perforation. Hiatal hernia.  The esophagogastric tube is in reasonable position. Electronically Signed   By: David  Martinique M.D.   On: 09/21/2017 13:12   Dg Abd Acute W/chest  Result Date: 09/14/2017 CLINICAL DATA:  Vomiting. EXAM: DG ABDOMEN ACUTE W/ 1V CHEST COMPARISON:  08/26/2017 FINDINGS: Left-sided nephroureteral stent is identified. Small bowel air-fluid levels are identified. Small bowel loops are mildly increased in caliber measuring up to 2.6 cm. There is no evidence of free intraperitoneal air.  Heart size and mediastinal contours are within normal limits. There are bilateral pleural effusions noted right greater than left. Both lungs are clear. IMPRESSION: 1. Mildly increased caliber of small bowel loops with air-fluid levels. Cannot rule out low grade partial obstruction. 2. Status post left ureteral stenting. 3. Bilateral pleural effusions. Electronically Signed   By: Kerby Moors M.D.   On: 09/14/2017 16:58   Dg C-arm 1-60 Min-no Report  Result Date: 09/05/2017 Fluoroscopy was utilized by the requesting physician.  No radiographic interpretation.   US Abdomen Limited Ruq  Result Date: 09/03/2017 CLINICAL DATA:  Pancreatitis EXAM: ULTRASOUND ABDOMEN LIMITED RIGHT UPPER QUADRANT COMPARISON:  None. FINDINGS: Gallbladder: Sludge in the gallbladder. Normal wall thickness. Negative sonographic Murphy. Common bile duct: Diameter: 2.5 mm Liver: Parenchymal echogenicity within normal limits. On some of the images, nodular liver contour is suggested. Portal vein is patent on color Doppler imaging with normal direction of blood flow towards the liver. The right pleural effusion is incidentally noted. There is moderate ascites in the right upper quadrant. IMPRESSION: 1. Sludge in the gallbladder. Negative for wall thickening, tenderness, or biliary dilatation 2. Right pleural effusion and moderate ascites in the right upper quadrant 3. On some of the images, liver contour appears slightly nodular, query hepatocellular disease. Electronically Signed   By: Donavan Foil M.D.   On: 09/03/2017 14:56   Ir Paracentesis  Result Date: 09/13/2017 INDICATION: Patient with recurrent ascites. Request is made for diagnostic and therapeutic paracentesis. EXAM: ULTRASOUND GUIDED DIAGNOSTIC AND THERAPEUTIC PARACENTESIS MEDICATIONS: 10 mL 2% lidocaine COMPLICATIONS: None immediate. PROCEDURE: Informed written consent was obtained from the patient after a discussion of the risks, benefits and alternatives to  treatment. A timeout was performed prior to the initiation of the procedure. Initial ultrasound scanning demonstrates a large amount of ascites within the left lateral abdomen. The left lateral abdomen was prepped and draped in the usual sterile fashion. 2% lidocaine was used for local anesthesia. Following this, a 19 gauge, 7-cm, Yueh catheter was introduced. An ultrasound image was saved for documentation purposes. The paracentesis was performed. The catheter was removed and a dressing was applied. The patient tolerated the procedure well without immediate post procedural complication. FINDINGS: A total of approximately 2.0 liters of red-colored fluid was removed. Samples were sent to the laboratory as requested by the clinical team. IMPRESSION: Successful ultrasound-guided paracentesis yielding 2.0 liters of peritoneal fluid. Read by:  Brynda Greathouse PA-C Electronically Signed   By: Jerilynn Mages.  Shick M.D.   On: 09/13/2017 15:21     LOS: 15 days   Oren Binet, MD  [facility] Pager:[phone]  If 7PM-7AM, please contact night-coverage [email or web address] Password [facility] 09/30/2017, 10:22 AM

## 2017-09-30 NOTE — Progress Notes (Signed)
PCA Morphine D/C'd per order 29 mg/ml wasted in sink witnessed by K. Hardin Negus, RN. Macayla Ekdahl R. Lelon Frohlich, RN

## 2017-09-30 NOTE — Progress Notes (Signed)
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 Patient reports poor oral intake and unable to eat solid foods for past few weeks but 24 hours prior to this admission, she felt a lot worse, bloated, uncomfortable. /p EXPLORATORY LAPAROTOMY WITH PALLIATIVE SMALL BOWEL TO RIGHT COLON BY PASS WITH PLACEMENT OF GASTROSTOMY TUBE 1/10 Dr      Follow Up Recommendations  Home health OT;Supervision/Assistance - 24 hour    Equipment Recommendations  3 in 1 bedside commode    Recommendations for Other Services  Pt quiet this day after speaking to MD regarding situation.  Provided comfort    Precautions / Restrictions Precautions Precautions: Fall Precaution Comments: multiple lines Restrictions Weight Bearing Restrictions: No       Mobility Bed Mobility Overal bed mobility: Needs Assistance Bed Mobility: Sit to Supine       Sit to supine: Min assist   General bed mobility comments: A with legs  Transfers Overall transfer level: Needs assistance Equipment used: None Transfers: Sit to/from [facility] Sit to Stand: Min guard Stand pivot transfers: Min guard       General transfer comment: pt needed increased time 2* pain    Balance Overall balance assessment: No apparent balance deficits (not formally assessed)                                         ADL either performed or assessed with clinical judgement   ADL Overall ADL's : Needs assistance/impaired                         Toilet Transfer: Supervision/safety;BSC;Stand-pivot   Toileting- Marine scientist and Hygiene: Min guard;Sit to/from stand;Cueing for safety;Cueing for sequencing         General ADL Comments: pt quiet this day after speaking to MD               Cognition Arousal/Alertness: Awake/alert Behavior During Therapy: WFL for tasks assessed/performed Overall Cognitive Status: Within Functional Limits for tasks assessed                                                     Pertinent Vitals/ Pain       Pain Assessment: No/denies pain  Home Living   Prior Functioning/Environment    Frequency  Min 2X/week        Progress Toward Goals  OT Goals(current goals can now be found in the care plan section)  Progress towards OT goals: Progressing toward goals     Plan Discharge plan remains appropriate    Co-evaluation                 AM-PAC PT "6 Clicks" Daily Activity     Outcome Measure   Help from another person eating meals?: None Help from another person taking care of personal grooming?: A Little Help from another person toileting, which includes using toliet, bedpan,  or urinal?: A Lot Help from another person bathing (including washing, rinsing, drying)?: A Lot Help from another person to put on and taking off regular upper body clothing?: A Little Help from another person to put on and taking off regular lower body clothing?: A Lot 6 Click Score: 16    End of Session    OT Visit Diagnosis: Muscle weakness (generalized) (M62.81)   Activity Tolerance Patient tolerated treatment well   Patient Left with call bell/phone within reach;in bed   Nurse Communication Mobility status        Time: 0630-1601 OT Time Calculation (min): 15 min  Charges: OT General Charges $OT Visit: 1 Visit OT Treatments $Self Care/Home Management : 8-22 mins  [place], [place]   Betsy Pries 09/30/2017, 1:54 PM

## 2017-10-01 ENCOUNTER — Inpatient Hospital Stay (HOSPITAL_COMMUNITY): Payer: BLUE CROSS/BLUE SHIELD

## 2017-10-01 DIAGNOSIS — N179 Acute kidney failure, unspecified: Secondary | ICD-10-CM

## 2017-10-01 DIAGNOSIS — C189 Malignant neoplasm of colon, unspecified: Secondary | ICD-10-CM

## 2017-10-01 DIAGNOSIS — Z515 Encounter for palliative care: Secondary | ICD-10-CM

## 2017-10-01 DIAGNOSIS — Z7189 Other specified counseling: Secondary | ICD-10-CM

## 2017-10-01 DIAGNOSIS — C786 Secondary malignant neoplasm of retroperitoneum and peritoneum: Secondary | ICD-10-CM

## 2017-10-01 DIAGNOSIS — C801 Malignant (primary) neoplasm, unspecified: Secondary | ICD-10-CM

## 2017-10-01 LAB — COMPREHENSIVE METABOLIC PANEL
ALK PHOS: 246 U/L — AB (ref 38–126)
ALT: 66 U/L — AB (ref 14–54)
AST: 54 U/L — AB (ref 15–41)
Albumin: 1.5 g/dL — ABNORMAL LOW (ref 3.5–5.0)
Anion gap: 11 (ref 5–15)
BUN: 62 mg/dL — AB (ref 6–20)
CALCIUM: 8 mg/dL — AB (ref 8.9–10.3)
CHLORIDE: 97 mmol/L — AB (ref 101–111)
CO2: 29 mmol/L (ref 22–32)
CREATININE: 1.84 mg/dL — AB (ref 0.44–1.00)
GFR calc Af Amer: 33 mL/min — ABNORMAL LOW (ref >60)
GFR, EST NON AFRICAN AMERICAN: 28 mL/min — AB (ref >60)
Glucose, Bld: 114 mg/dL — ABNORMAL HIGH (ref 65–99)
Potassium: 3.4 mmol/L — ABNORMAL LOW (ref 3.5–5.1)
Sodium: 137 mmol/L (ref 135–145)
Total Bilirubin: 2.2 mg/dL — ABNORMAL HIGH (ref 0.3–1.2)
Total Protein: 5.9 g/dL — ABNORMAL LOW (ref 6.5–8.1)

## 2017-10-01 LAB — PHOSPHORUS: Phosphorus: 4.3 mg/dL (ref 2.5–4.6)

## 2017-10-01 LAB — MAGNESIUM: Magnesium: 2.6 mg/dL — ABNORMAL HIGH (ref 1.7–2.4)

## 2017-10-01 MED ORDER — ENOXAPARIN SODIUM 30 MG/0.3ML ~~LOC~~ SOLN
30.0000 mg | SUBCUTANEOUS | Status: DC
  Administered 2017-10-01 – 2017-10-07 (×7): 30 mg via SUBCUTANEOUS
  Filled 2017-10-01 (×7): qty 0.3

## 2017-10-01 MED ORDER — TRACE MINERALS CR-CU-MN-SE-ZN 10-1000-500-60 MCG/ML IV SOLN
INTRAVENOUS | Status: AC
  Administered 2017-10-01: 18:00:00 via INTRAVENOUS
  Filled 2017-10-01: qty 960

## 2017-10-01 MED ORDER — POTASSIUM CHLORIDE CRYS ER 20 MEQ PO TBCR
40.0000 meq | EXTENDED_RELEASE_TABLET | Freq: Once | ORAL | Status: AC
  Administered 2017-10-01: 40 meq via ORAL

## 2017-10-01 NOTE — Progress Notes (Signed)
PROGRESS NOTE        PATIENT DETAILS Name: Christie Maynard Age: 62 y.o. Sex: female Date of Birth: 08-04-1956 Admit Date: 09/14/2017 Admitting Physician A Melven Sartorius., MD HCW:CBJSEG, Baldemar Friday., PA-C  Brief Narrative: Patient is a 62 y.o. 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 She was subsequently diagnosed with small bowel obstruction, further evaluation with a colonoscopy revealed a cecal mass. She unfortunately appears to have metastatic colon cancer (and not ovarian cancer). She underwent a exploratory laparotomy with palliative small bowel to right colon bypass and placement of a gastrostomy tube on 1/10. Postoperatively, she she has developed a prolonged ileus, anasarca/volume overload and acute kidney injury.See below for further details.   Subjective: Continues to have no BM or flatus yet.  Continues to have significant lower extremity edema.  Assessment/Plan: Principal Problem: SBO (small bowel obstruction) likely secondary to a cecal mass: Underwent exploratory laparotomy, and palliative small bowel to right colon bypass with placement of a gastrostomy tube on 1/10.  She continues to have a ileus postoperatively-without any BM or flatus yet.  Per general surgery not uncommon to have prolonged ileus in the setting of significant intra-abdominal malignancy.We will continue with full supportive care-in the hopes of recovering bowel function-but in the meantime we are still awaiting palliative care evaluation for further delineation of goals of care in case her bowel function still does not come back over the next several days.    Metastatic colon cancer/peritoneal carcinomatosis: She was initially following with Dr. 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 Medical oncology (Dr. Benay Spice) following.  Acute kidney injury: Creatinine continues to worsen slightly every day-she remains volume overloaded-she did get Lasix a few days back but this has been on hold.  Given worsening renal function-we will go ahead and repeat a UA and a renal ultrasound.  L ureteral obstruction s/p stent placement: Due to worsening creatinine-we will go ahead and repeat a renal ultrasound.  Leukocytosis: I suspect this is mostly reactive-probably secondary to inflammation from peritoneal carcinomatosis-recent surgery. Do not see any foci of infection apparent-spoke with general surgery-okay to stop Zosyn at this time and monitor off antimicrobial therapy.  Anemia: Likely secondary to acute illness on top of chronic disease. Hemoglobin stable. Continue to follow periodically.  Anasarca/lower extremity edema: Secondary to hypoalbuminemia/critical illness-continues to have significant lower extremity edema.  See plans above to continue to hold diuretics for now.    Transaminitis: Remain stable-slightly increase in alkaline phosphatase-but that could be from TNA.  Seizure disorder: Continue IV Dilantin-with plans to transition to oral route when she is able to tolerate more by mouth intake.  DVT Prophylaxis: Prophylactic Lovenox   Code Status: Full code   Family Communication: Spouse at bedside  Disposition Plan: Remain inpatient  Antimicrobial agents: Anti-infectives (From admission, onward)   Start     Dose/Rate Route Frequency Ordered Stop   09/25/17 1200  piperacillin-tazobactam (ZOSYN)  IVPB 3.375 g  Status:  Discontinued     3.375  g 12.5 mL/hr over 240 Minutes Intravenous Every 8 hours 09/25/17 1054 09/30/17 0852   09/23/17 1249  cefoTEtan in Dextrose 5% (CEFOTAN) 2-2.08 GM-%(50ML) IVPB    Comments:  [name]   : cabinet override      09/23/17 1249 09/24/17 0059   09/23/17 1100  cefoTEtan (CEFOTAN) 2 g in dextrose 5 % 50 mL IVPB     2 g 100 mL/hr over 30 Minutes Intravenous  Once 09/22/17 1549 09/23/17 1333      Procedures:  CT-guided core biopsy of peritoneal tumor per Dr. Kathlene Cote 09/17/2017  Endoscopy per Dr. Cristina Gong 09/22/2016  Colonoscopy per Dr. Cristina Gong 09/22/2017  Exploratory laparotomy with palliative small bowel to right colon bypass with placement of gastrostomy tube per Dr. Marlou Starks 09/23/2017  CONSULTS:  Oncology   Gastroenterology   General surgery   GYN oncology  Palliative care   Time spent: 25- minutes-Greater than 50% of this time was spent in counseling, explanation of diagnosis, planning of further management, and coordination of care.  MEDICATIONS: Scheduled Meds: . enoxaparin (LOVENOX) injection  30 mg Subcutaneous Q24H  . feeding supplement  1 Container Oral TID BM  . phenytoin (DILANTIN) IV  100 mg Intravenous QHS  . potassium chloride  40 mEq Oral Once  . potassium chloride  40 mEq Oral Once   Continuous Infusions: . sodium chloride 10 mL/hr at 09/25/17 1214  . TPN (CLINIMIX) Adult without lytes 40 mL/hr at 09/30/17 1701  . TPN (CLINIMIX) Adult without lytes     PRN Meds:.morphine injection, morphine CONCENTRATE, ondansetron **OR** ondansetron (ZOFRAN) IV, sodium chloride flush   PHYSICAL EXAM: Vital signs: Vitals:   09/30/17 1430 09/30/17 1500 09/30/17 2121 10/01/17 0430  BP: 128/80 126/78 120/78 120/67  Pulse: 89 91 89 76  Resp: (!) 22 (!) 22 (!) 22 (!) 22  Temp: 98.2 F (36.8 C) 98.4 F (36.9 C)  98.2 F (36.8 C)  TempSrc: Oral Oral Oral Oral  SpO2: 98% 98% 98% 97%  Weight:      Height:       Filed Weights   09/14/17 1127 09/14/17 2110 09/22/17 1217   Weight: 68 kg (150 lb) 63.4 kg (139 lb 12.8 oz) 63 kg (139 lb)   Body mass index is 25.42 kg/m.  General appearance :Awake, alert, not in any distress.  Eyes:, pupils equally reactive to light and accomodation,no scleral icterus. HEENT: Atraumatic and Normocephalic Neck: supple, no JVD. Resp:Good air entry bilaterally, no rales or rhonchi CVS: S1 S2 regular, no murmurs.  GI: Bowel sounds very sluggish, an incision looks dry-staples in place.  Non tender and not distended Extremities: B/L Lower Ext shows ++ edema, both legs are warm to touch Neurology:  speech clear,Non focal, sensation is grossly intact. Psychiatric: Normal judgment and insight. Normal mood. Musculoskeletal:No digital cyanosis Skin:No Rash, warm and dry Wounds:N/A  I have personally reviewed following labs and imaging studies  LABORATORY DATA: CBC: Recent Labs  Lab 09/25/17 0518 09/26/17 0402 09/27/17 0449 09/28/17 0523 09/29/17 0303  WBC 22.3* 17.8* 16.3* 16.2* 15.2*  NEUTROABS  --  14.3* 13.4*  --   --   HGB 12.1 11.3* 11.1* 10.4* 9.7*  HCT 37.6 34.5* 33.5* 30.5* 28.8*  MCV 87.9 86.0 85.7 84.7 84.0  PLT 267 316 343 360 402*    Basic Metabolic Panel: Recent Labs  Lab 09/27/17 0449 09/28/17 0523 09/29/17 0303 09/30/17 0705 10/01/17 0444  NA 135 134*  136 137  137 137  K 4.6 4.5 4.1 3.7  3.7 3.4*  CL 103 100* 99* 98*  99* 97*  CO2 24 23 27 29  27 29   GLUCOSE 134* 126* 101* 109*  107* 114*  BUN 35* 53* 57* 58*  58* 62*  CREATININE 0.94 1.23* 1.45* 1.73*  1.70* 1.84*  CALCIUM 7.8* 7.9* 8.2* 8.0*  8.1* 8.0*  MG 2.3 2.5* 2.5* 2.7* 2.6*  PHOS 5.1* 6.2* 5.9* 5.0* 4.3    GFR: Estimated Creatinine Clearance: 27.3 mL/min (A) (by C-G formula based on SCr of 1.84 mg/dL (H)).  Liver Function Tests: Recent Labs  Lab 09/27/17 0449 09/28/17 1052 09/29/17 0303 09/30/17 0705 10/01/17 0444  AST 136* 67* 63* 64* 54*  ALT 116* 83* 74* 72* 66*  ALKPHOS 170* 174* 186* 226* 246*  BILITOT 1.8*  2.5* 2.9* 2.3* 2.2*  PROT 5.1* 5.3* 5.5* 5.6* 5.9*  ALBUMIN 1.4* 1.3* 1.4* 1.4* 1.5*   No results for input(s): LIPASE, AMYLASE in the last 168 hours. No results for input(s): AMMONIA in the last 168 hours.  Coagulation Profile: No results for input(s): INR, PROTIME in the last 168 hours.  Cardiac Enzymes: No results for input(s): CKTOTAL, CKMB, CKMBINDEX, TROPONINI in the last 168 hours.  BNP (last 3 results) No results for input(s): PROBNP in the last 8760 hours.  HbA1C: No results for input(s): HGBA1C in the last 72 hours.  CBG: Recent Labs  Lab 09/26/17 0618 09/26/17 1237 09/26/17 1727 09/26/17 2305 09/27/17 0639  GLUCAP 126* 134* 134* 131* 137*    Lipid Profile: No results for input(s): CHOL, HDL, LDLCALC, TRIG, CHOLHDL, LDLDIRECT in the last 72 hours.  Thyroid Function Tests: No results for input(s): TSH, T4TOTAL, FREET4, T3FREE, THYROIDAB in the last 72 hours.  Anemia Panel: No results for input(s): VITAMINB12, FOLATE, FERRITIN, TIBC, IRON, RETICCTPCT in the last 72 hours.  Urine analysis:    Component Value Date/Time   COLORURINE AMBER (A) 09/25/2017 1120   APPEARANCEUR CLOUDY (A) 09/25/2017 1120   LABSPEC 1.019 09/25/2017 1120   PHURINE 5.0 09/25/2017 1120   GLUCOSEU NEGATIVE 09/25/2017 1120   HGBUR LARGE (A) 09/25/2017 1120   BILIRUBINUR NEGATIVE 09/25/2017 1120   KETONESUR NEGATIVE 09/25/2017 1120   PROTEINUR 30 (A) 09/25/2017 1120   NITRITE NEGATIVE 09/25/2017 1120   LEUKOCYTESUR TRACE (A) 09/25/2017 1120    Sepsis Labs: Lactic Acid, Venous    Component Value Date/Time   LATICACIDVEN 0.9 09/14/2017 2343    MICROBIOLOGY: Recent Results (from the past 240 hour(s))  Surgical PCR screen     Status: None   Collection Time: 09/23/17 12:03 AM  Result Value Ref Range Status   MRSA, PCR NEGATIVE NEGATIVE Final   Staphylococcus aureus NEGATIVE NEGATIVE Final    Comment: (NOTE) The Xpert SA Assay (FDA approved for NASAL specimens in patients  37 years of age and older), is one component of a comprehensive surveillance program. It is not intended to diagnose infection nor to guide or monitor treatment.   Culture, Urine     Status: None   Collection Time: 09/25/17 11:30 AM  Result Value Ref Range Status   Specimen Description URINE, RANDOM  Final   Special Requests NONE  Final   Culture   Final    NO GROWTH Performed at [facility], [address] [place], [place] [address]    Report Status 09/26/2017 FINAL  Final    RADIOLOGY STUDIES/RESULTS: Dg Abd 1 View  Result Date: 09/15/2017 CLINICAL DATA:  Nasogastric tube placement.  Pelvic malignancy. EXAM: ABDOMEN - 1 VIEW COMPARISON:  Radiographs and CT 09/14/2017. FINDINGS: 1537 hours. Enteric tube projects below the diaphragm with tip in the left subphrenic region, likely in the gastric fundus. There is a double-J left ureteral stent. Mild bowel wall thickening is present in the left mid abdomen. No supine evidence of free intraperitoneal air. Right pelvic calcifications consistent with phleboliths are stable. IMPRESSION: Enteric tube projects over the left upper quadrant of the abdomen, likely in the gastric fundus. Electronically Signed   By: Richardean Sale M.D.   On: 09/15/2017 16:11   Ct Abdomen Pelvis W Contrast  Result Date: 09/14/2017 CLINICAL DATA:  Abdominal swelling and vomiting. Increasing weakness. Patient is recently been diagnosed with cervical cancer. Paracentesis 1 day ago and last week. EXAM: CT ABDOMEN AND PELVIS WITH CONTRAST TECHNIQUE: Multidetector CT imaging of the abdomen and pelvis was performed using the standard protocol following bolus administration of intravenous contrast. CONTRAST:  46mL ISOVUE-300 IOPAMIDOL (ISOVUE-300) INJECTION 61% COMPARISON:  09/03/2017 FINDINGS: Lower chest: Small bilateral pleural effusions, greater on the right. Mild basilar atelectasis is likely compressive. Moderate-sized esophageal hiatal hernia. Hepatobiliary: 1 cm  diameter hypo dense lesion in segment 4 of the liver may represent a metastatic focus. No other focal liver lesions identified. Gallbladder and bile ducts are unremarkable. Pancreas: Pancreas is atrophic. No focal lesion or inflammatory changes appreciated. Spleen: Spleen size is normal.  No focal lesions. Adrenals/Urinary Tract: Right adrenal gland nodule measuring 2.2 cm in diameter. Hounsfield unit measurements on portal venous and delayed phase imaging demonstrate a relative washout of 55%. Relative washout of 40% or greater suggests benign adenoma. Renal nephrograms are homogeneous. Mildly dilated left intrarenal collecting system. A left ureteral stent is in place. Bladder is unremarkable. Stomach/Bowel: Stomach is not abnormally distended. There is diffuse dilatation of fluid-filled small bowel with decompressed terminal ileum. This is progressing since the previous study. Transition zone appears to be in the right lower quadrant. Appearance is consistent with small bowel obstruction. Cause of obstruction may be extrinsic compression of the small bowel, metastasis, or direct invasion. Colon is decompressed. Small amount of residual contrast material in the colon. Appendix is normal. Vascular/Lymphatic: No significant vascular findings are present. No enlarged abdominal or pelvic lymph nodes. Reproductive: Large heterogeneous pelvic mass extending above the uterus. This likely arises from the right ovary and appears to demonstrate direct invasion of the uterine fundus on the left. The mass measures about 6.7 by 10.2 x 10.7 cm. Other: Right lower quadrant mesenteric mass measuring 4.4 cm in diameter. Diffuse nodular infiltration throughout the omentum and mesenteric fat consistent with diffuse peritoneal metastasis. Small amount of free fluid throughout the abdomen and pelvis. No free air. Abdominal wall musculature appears intact. Musculoskeletal: Mild degenerative changes in the spine. Heterogeneous  appearance of the proximal left femur with trabecular coarsening. Appearance is most consistent with Paget's disease although metastatic lesion could also have this appearance. IMPRESSION: 1. Large heterogeneous pelvic mass lesion consistent with malignancy, likely ovarian in origin. Probable direct invasion of the uterus. 2. Diffuse peritoneal carcinomatosis with nodular infiltration of the omentum and mesentery and diffuse peritoneal fluid. Right lower quadrant mass measuring 4.4 cm diameter, likely metastatic. 3. Progressing small bowel obstruction with diffusely dilated fluid-filled small bowel. Transition zone is in the right lower quadrant. 4. Right adrenal gland nodule has characteristics suggesting a benign adenoma. 5. Bone changes in the proximal left femur probably represent Paget's disease although bone metastasis is not excluded. Consider  bone scan for further evaluation if clinically indicated. 6. Left 3 atrial stent in place with mild residual hydronephrosis. 7. 1 cm low-attenuation lesion in the left lobe of the liver suspicious for metastasis. Electronically Signed   By: Lucienne Capers M.D.   On: 09/14/2017 19:29   Ct Abdomen Pelvis W Contrast  Result Date: 09/03/2017 CLINICAL DATA:  New onset ascites, epigastric pain, nausea, and vomiting for 3 days, abdominal distension swelling, recent UTI EXAM: CT ABDOMEN AND PELVIS WITH CONTRAST TECHNIQUE: Multidetector CT imaging of the abdomen and pelvis was performed using the standard protocol following bolus administration of intravenous contrast. Sagittal and coronal MPR images reconstructed from axial data set. CONTRAST:  100 ML ISOVUE-300 IOPAMIDOL (ISOVUE-300) INJECTION 61% IV. No oral contrast given. COMPARISON:  None FINDINGS: Lower chest: Tiny BILATERAL pleural effusions larger on RIGHT. Subsegmental atelectasis at LEFT base. Hepatobiliary: Dependent density within gallbladder question calculi or sludge. Liver normal appearance. No biliary  dilatation. Pancreas: Normal appearance Spleen: Normal appearance Adrenals/Urinary Tract: RIGHT adrenal mass 22 x 19 mm demonstrating significant washout on delayed images consistent with adenoma. LEFT adrenal gland unremarkable. LEFT hydronephrosis and hydroureter present. No RIGHT-side urinary tract dilatation. LEFT ureteral dilatation terminates at a mass in the pelvis see below. Inhomogeneous LEFT nephrogram question pyelonephritis. No renal mass lesions. Bladder decompressed. Stomach/Bowel: Appendix not localized. Colon decompressed though displaced medially especially RIGHT by ascites. Moderate to large hiatal hernia. Decompressed proximal and minimally dilated distal small bowel loops suggesting mild degree of small bowel obstruction. Transition zone appears to be within the RIGHT pelvis at the level of the distal ileum. No definite bowel wall thickening Vascular/Lymphatic: Aorta normal caliber.  No definite adenopathy. Reproductive: Irregular heterogeneous nodular appearing soft tissue masses in the adnexa bilaterally with nodular areas of peripheral irregular enhancement compatible with ovarian neoplasm. It is uncertain whether this represents a single large multilobulated mass which extends across the midline in both adnexal regions or contiguous BILATERAL ovarian masses. The larger mass portion to the RIGHT of midline measures 10.6 x 4.9 x 9.8 cm and the smaller component to the LEFT of midline measures 8.2 x 4.9 x 4.8 cm. Tumor masses are contiguous with the uterus. Mildly complicated cystic lesion at the LEFT lateral aspect of the uterus 3.1 x 2.4 cm image 74 could be of uterine or ovarian origin. Other: Large amount of ascites. Fluid in lesser sac. Diffuse omental caking by tumor. No free air. No hernia. Musculoskeletal: No acute osseous findings. Prominent epidural vessels within the lumbar spinal canal. IMPRESSION: Multilobulated irregular heterogeneous enhancing soft tissue mass within the pelvis  compatible with neoplasm likely of ovarian origin, either involving both ovaries or single mass arising from the RIGHT and extending across the midline, with components measuring 10.6 x 4.9 x 9.8 cm and 8.2 x 4.9 x 4.8 cm. Associated ascites and omental caking consistent with peritoneal carcinomatosis. Suspect mild distal small bowel obstruction. Distal LEFT ureteral obstruction with LEFT hydronephrosis and hydroureter. Patchy LEFT nephrogram most likely representing pyelonephritis recommend correlation with urinalysis. Sludge versus dependent calculi within gallbladder. Large hiatal hernia. Tiny BILATERAL pleural effusions. Findings called to Dr. Broadus John on 09/03/2017 at 1741 hours. Electronically Signed   By: Lavonia Dana M.D.   On: 09/03/2017 17:48   US Paracentesis  Result Date: 09/04/2017 INDICATION: Pelvic mass, carcinomatosis and large volume ascites. EXAM: ULTRASOUND GUIDED PARACENTESIS MEDICATIONS: None. COMPLICATIONS: None immediate. PROCEDURE: Informed written consent was obtained from the patient after a discussion of the risks, benefits and alternatives to treatment.  A timeout was performed prior to the initiation of the procedure. Initial ultrasound was performed to localize ascites. The right lower abdomen was prepped and draped in the usual sterile fashion. 1% lidocaine was used for local anesthesia. Following this, a 6 Fr Safe-T-Centesis catheter was introduced. An ultrasound image was saved for documentation purposes. The paracentesis was performed. The catheter was removed and a dressing was applied. The patient tolerated the procedure well without immediate post procedural complication. FINDINGS: A total of approximately 4.8 L of amber, slightly blood tinged fluid was removed. Samples were sent to the laboratory as requested by the clinical team. IMPRESSION: Successful ultrasound-guided paracentesis yielding 4.8 liters of peritoneal fluid. Electronically Signed   By: Aletta Edouard M.D.   On:  09/04/2017 13:31   Ct Biopsy  Result Date: 09/17/2017 CLINICAL DATA:  Pelvic mass, diffuse peritoneal carcinomatosis and ascites. Prior cytology of peritoneal fluid was not conclusive for gynecologic malignancy and suggested potential GI origin. Request has now been made to perform biopsy of peritoneal tumor for more definitive diagnosis. EXAM: CT GUIDED CORE BIOPSY OF PERITONEAL MASS COMPARISON:  CT of the abdomen and pelvis on 09/14/2017 and 09/03/2017 ANESTHESIA/SEDATION: 2.0 mg IV Versed; 100 mcg IV Fentanyl Total Moderate Sedation Time:  20 minutes. The patient's level of consciousness and physiologic status were continuously monitored during the procedure by Radiology nursing. PROCEDURE: The procedure risks, benefits, and alternatives were explained to the patient. Questions regarding the procedure were encouraged and answered. The patient understands and consents to the procedure. The abdominal wall was prepped with chlorhexidine in a sterile fashion, and a sterile drape was applied covering the operative field. A sterile gown and sterile gloves were used for the procedure. Local anesthesia was provided with 1% Lidocaine. CT was performed in a supine position. Under CT guidance, a 40 gauge trocar needle was advanced from an anterior oblique position into the left anterior peritoneal cavity. Core biopsy was performed with an 18 gauge core biopsy needle device. A total of 4 samples were obtained and submitted in formalin. Additional CT images were obtained after outer needle removal. COMPLICATIONS: None FINDINGS: Small bowel shows decompression since prior CT on 09/14/2017 after nasogastric decompression. This now allows for a safe window to approach of peritoneal tumor in the anterior peritoneal cavity/omental region. Tumor just deep to the left anterior abdominal wall was targeted and revealed solid tissue with biopsy. IMPRESSION: CT-guided core biopsy performed of peritoneal tumor. Electronically Signed    By: Aletta Edouard M.D.   On: 09/17/2017 16:26   Dg Chest Port 1 View  Result Date: 09/25/2017 CLINICAL DATA:  Leukocytosis and status post laparotomy and bowel bypass for colon carcinoma. EXAM: PORTABLE CHEST 1 VIEW COMPARISON:  08/30/2017 FINDINGS: Right arm PICC line extends to the distal SVC. The heart size and mediastinal contours are within normal limits. Lung volumes are low with bibasilar atelectasis present. There may be small bilateral pleural effusions. There is no evidence of pulmonary edema, consolidation, pneumothorax or nodules. The visualized skeletal structures are unremarkable. IMPRESSION: Low lung volumes with bibasilar atelectasis. Possible small bilateral pleural effusions. Electronically Signed   By: Aletta Edouard M.D.   On: 09/25/2017 10:39   Dg Abd 2 Views  Result Date: 09/21/2017 CLINICAL DATA:  Follow-up small bowel obstruction. Clinically improved. EXAM: ABDOMEN - 2 VIEW COMPARISON:  Abdominal radiograph of January 2nd 2019 and CT scan of the abdomen of September 17, 2017. FINDINGS: There remain loops of mildly distended gas-filled small bowel to the right of  midline and in the mid abdomen. There is a gas within the transverse colon and a small amount of gas within the rectum. There is gas within a hiatal hernia. The esophagogastric tube tip and proximal port project below the hemidiaphragms. No free extraluminal gas collections are observed. A double pigtail stent is present in the left kidney and ureter extending into the bladder. IMPRESSION: Persistent partial distal small bowel obstruction. No evidence of perforation. Hiatal hernia.  The esophagogastric tube is in reasonable position. Electronically Signed   By: David  Martinique M.D.   On: 09/21/2017 13:12   Dg Abd Acute W/chest  Result Date: 09/14/2017 CLINICAL DATA:  Vomiting. EXAM: DG ABDOMEN ACUTE W/ 1V CHEST COMPARISON:  08/26/2017 FINDINGS: Left-sided nephroureteral stent is identified. Small bowel air-fluid levels are  identified. Small bowel loops are mildly increased in caliber measuring up to 2.6 cm. There is no evidence of free intraperitoneal air. Heart size and mediastinal contours are within normal limits. There are bilateral pleural effusions noted right greater than left. Both lungs are clear. IMPRESSION: 1. Mildly increased caliber of small bowel loops with air-fluid levels. Cannot rule out low grade partial obstruction. 2. Status post left ureteral stenting. 3. Bilateral pleural effusions. Electronically Signed   By: Kerby Moors M.D.   On: 09/14/2017 16:58   Dg C-arm 1-60 Min-no Report  Result Date: 09/05/2017 Fluoroscopy was utilized by the requesting physician.  No radiographic interpretation.   US Abdomen Limited Ruq  Result Date: 09/03/2017 CLINICAL DATA:  Pancreatitis EXAM: ULTRASOUND ABDOMEN LIMITED RIGHT UPPER QUADRANT COMPARISON:  None. FINDINGS: Gallbladder: Sludge in the gallbladder. Normal wall thickness. Negative sonographic Murphy. Common bile duct: Diameter: 2.5 mm Liver: Parenchymal echogenicity within normal limits. On some of the images, nodular liver contour is suggested. Portal vein is patent on color Doppler imaging with normal direction of blood flow towards the liver. The right pleural effusion is incidentally noted. There is moderate ascites in the right upper quadrant. IMPRESSION: 1. Sludge in the gallbladder. Negative for wall thickening, tenderness, or biliary dilatation 2. Right pleural effusion and moderate ascites in the right upper quadrant 3. On some of the images, liver contour appears slightly nodular, query hepatocellular disease. Electronically Signed   By: Donavan Foil M.D.   On: 09/03/2017 14:56   Ir Paracentesis  Result Date: 09/13/2017 INDICATION: Patient with recurrent ascites. Request is made for diagnostic and therapeutic paracentesis. EXAM: ULTRASOUND GUIDED DIAGNOSTIC AND THERAPEUTIC PARACENTESIS MEDICATIONS: 10 mL 2% lidocaine COMPLICATIONS: None immediate.  PROCEDURE: Informed written consent was obtained from the patient after a discussion of the risks, benefits and alternatives to treatment. A timeout was performed prior to the initiation of the procedure. Initial ultrasound scanning demonstrates a large amount of ascites within the left lateral abdomen. The left lateral abdomen was prepped and draped in the usual sterile fashion. 2% lidocaine was used for local anesthesia. Following this, a 19 gauge, 7-cm, Yueh catheter was introduced. An ultrasound image was saved for documentation purposes. The paracentesis was performed. The catheter was removed and a dressing was applied. The patient tolerated the procedure well without immediate post procedural complication. FINDINGS: A total of approximately 2.0 liters of red-colored fluid was removed. Samples were sent to the laboratory as requested by the clinical team. IMPRESSION: Successful ultrasound-guided paracentesis yielding 2.0 liters of peritoneal fluid. Read by:  Brynda Greathouse PA-C Electronically Signed   By: Jerilynn Mages.  Shick M.D.   On: 09/13/2017 15:21     LOS: 16 days   Oren Binet, MD  [facility] Pager:[phone]  If 7PM-7AM, please contact night-coverage [email or web address] Password [email or web address] 10/01/2017, 11:01 AM

## 2017-10-01 NOTE — Progress Notes (Signed)
PT Cancellation Note  Patient Details Name: Christie Maynard MRN: [id number] DOB: Nov 06, 1955   Cancelled Treatment:    Reason Eval/Treat Not Completed: Patient declined, no reason specified(wants PT to come back, will attempt as able )   [facility] 10/01/2017, 12:21 PM

## 2017-10-01 NOTE — Progress Notes (Signed)
[facility] Progress Note  8 Days Post-Op  Subjective: CC-  Resting comfortably. PCA discontinued yesterday and patient states that pain control is still adequate. No bowel function. Tolerating clears with G-tube to gravity. No flatus or BM. Denies n/v.  Met with palliative team this AM; patient does not feel like talking about this right now. Will await their consult note.  Objective: Vital signs in last 24 hours: Temp:  [98.2 F (36.8 C)-98.4 F (36.9 C)] 98.2 F (36.8 C) (01/18 0430) Pulse Rate:  [76-91] 76 (01/18 0430) Resp:  [22] 22 (01/18 0430) BP: (120-128)/(67-80) 120/67 (01/18 0430) SpO2:  [97 %-98 %] 97 % (01/18 0430) Last BM Date: 09/17/17  Intake/Output from previous day: 01/17 0701 - 01/18 0700 In: 3399.3 [P.O.:2160; I.V.:1239.3] Out: 6575 [Urine:625; Drains:5950] Intake/Output this shift: No intake/output data recorded.  PE: Gen: Alert, NAD, pleasant HEENT: EOM's intact, pupils equal and round Card: RRR, no M/G/R heard Pulm:effort normal Abd: mildly firm, distended,hypoactiveBS, midlineincision C/D/I with staples intact and no erythema or drainage, G-tube to gravity Psych: A&Ox3  Skin: no rashes noted, warm and dry  Lab Results:  Recent Labs    09/29/17 0303  WBC 15.2*  HGB 9.7*  HCT 28.8*  PLT 402*   BMET Recent Labs    09/30/17 0705 10/01/17 0444  NA 137  137 137  K 3.7  3.7 3.4*  CL 98*  99* 97*  CO2 29  27 29   GLUCOSE 109*  107* 114*  BUN 58*  58* 62*  CREATININE 1.73*  1.70* 1.84*  CALCIUM 8.0*  8.1* 8.0*   PT/INR No results for input(s): LABPROT, INR in the last 72 hours. CMP     Component Value Date/Time   NA 137 10/01/2017 0444   K 3.4 (L) 10/01/2017 0444   CL 97 (L) 10/01/2017 0444   CO2 29 10/01/2017 0444   GLUCOSE 114 (H) 10/01/2017 0444   BUN 62 (H) 10/01/2017 0444   CREATININE 1.84 (H) 10/01/2017 0444   CALCIUM 8.0 (L) 10/01/2017 0444   PROT 5.9 (L) 10/01/2017 0444   ALBUMIN 1.5 (L)  10/01/2017 0444   AST 54 (H) 10/01/2017 0444   ALT 66 (H) 10/01/2017 0444   ALKPHOS 246 (H) 10/01/2017 0444   BILITOT 2.2 (H) 10/01/2017 0444   GFRNONAA 28 (L) 10/01/2017 0444   GFRAA 33 (L) 10/01/2017 0444   Lipase     Component Value Date/Time   LIPASE 69 (H) 09/14/2017 1149       Studies/Results: No results found.  Anti-infectives: Anti-infectives (From admission, onward)   Start     Dose/Rate Route Frequency Ordered Stop   09/25/17 1200  piperacillin-tazobactam (ZOSYN) IVPB 3.375 g  Status:  Discontinued     3.375 g 12.5 mL/hr over 240 Minutes Intravenous Every 8 hours 09/25/17 1054 09/30/17 0852   09/23/17 1249  cefoTEtan in Dextrose 5% (CEFOTAN) 2-2.08 GM-%(50ML) IVPB    Comments:  [name]   : cabinet override      09/23/17 1249 09/24/17 0059   09/23/17 1100  cefoTEtan (CEFOTAN) 2 g in dextrose 5 % 50 mL IVPB     2 g 100 mL/hr over 30 Minutes Intravenous  Once 09/22/17 1549 09/23/17 1333       Assessment/Plan SBO/cecal mass Metastatic colon cancer, carcinomatosis S/pEXPLORATORY LAPAROTOMY WITH PALLIATIVE SMALL BOWEL TO RIGHT COLON BY PASS WITH PLACEMENT OF GASTROSTOMY TUBE1/10 Dr. Marlou Starks - POD8 -G tube to gravity, clear liquids for comfort -Ambulate/IS - Note from palliative consult for  goals of care discussion pending. No change in GI function. Continue clears for comfort and G-tube to gravity.   Elevated creatinine- Cr up to 1.84, low UOP, medicine ordered u/a and renal u/s today Elevated LFTs - likely 2/2 TNA, alk phos continues to slowly rise.   ID -zosyn 1/12 >> 1/16, cefotetan 1/10>>1/11 FEN -TPN @ 40 ml/hr, clear liquids, Boost VTE -lovenox Foley -none Follow up -Dr. Marlou Starks   LOS: 16 days    Wellington Hampshire , [facility] 10/01/2017, 9:53 AM Pager: [phone] Consults: [phone] Mon-Fri 7:00 am-4:30 pm Sat-Sun 7:00 am-11:30 am

## 2017-10-01 NOTE — Progress Notes (Signed)
PHARMACY - ADULT TOTAL PARENTERAL NUTRITION CONSULT NOTE & ANTIBIOTIC NOTE  Pharmacy Consult for TPN  Indication: bowel obstruction  Patient Measurements: Height: 5' 2"  (157.5 cm) Weight: 139 lb (63 kg) IBW/kg (Calculated) : 50.1 TPN AdjBW (KG): 53.3 Body mass index is 25.42 kg/m. 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 CT abdomen/pelviswith contrast shows progressing SBO. Having BMs but with 1450 ml from NGT, Surgery recommends starting TPN. Patient with significant recent weight loss but primarily d/t multiple large-volume paracentesis. Poor intake since week before Xmas, and unable to keep anything down since 12/31. Appears to be at risk for refeeding.  Significant events:  1/9 endoscopy/colonoscopy showed mass in cecum 1/10:  Exp lap with palliative small bowel to right colon bypass with placement of gastrostomy 1/14 continue g tube to drain and allow to drink for comfort, tolerating clear liquids; lipids stopped 1/15 take lytes out of TPN, rate to 1/2 per CCS 2nd elevated LFTs.  Per CCS notes, GI tract not functioning at this time. GI function may never return due to tumor burden, malignant SBO, with nothing left to do surgically. TPN is not indicated in this situation but has not been addressed with family.   Recent Labs    09/30/17 0705 10/01/17 0444  NA 137  137 137  K 3.7  3.7 3.4*  CL 98*  99* 97*  CO2 29  27 29   GLUCOSE  109*  107* 114*  BUN 58*  58* 62*  CREATININE 1.73*  1.70* 1.84*  CALCIUM 8.0*  8.1* 8.0*  PHOS 5.0* 4.3  MG 2.7* 2.6*  ALBUMIN 1.4* 1.5*  ALKPHOS 226* 246*  AST 64* 54*  ALT 72* 66*  BILITOT 2.3* 2.2*   Today, 10/01/2017:   Glucose (goal <150):  DC SSI/CBGs 1/14  Electrolytes: removed from TPN formula 1/15  K 3.4 (decreased)   Phos 4.3 now improved to wnl   CorrCa 10 (CaxPhos product 43 - goal < 55)  Mg elevated at 2.6 (unchanged)  Na 137 (improved)   Renal: SCr up to 1.84, UOP down, given lasix 40 on 1/14, 65m 1/15;  drains out 5950 ml, 1 BM recorded 1/13 In Epic  but pt denies having BM/flatus  LFTs: slightly elevated on admit, increased 1/14, AST/ALT improved overnight 54/66, Alk phos increased 246, Tbili elevated at 2.2  TGs: elevated 180 (1/7) 465 (1/14) - not receiving lipids currently  Prealbumin: 8.4 (1.7) and 6.1 on 1/14  NUTRITIONAL GOALS  RD recs (09/20/17):  Kcal:  1700-1900 Protein:  80-90 grams Fluid:  >/= 1.7 L/d  Clinimix-E 5/15 at a goal rate of 75 ml/hr + 20% fat emulsion 240 ml / day to provide: 90 g/day protein, 1758 Kcal/day.  PLAN                                                                                  Now: KDur 61mq PO x 2 doses today  Today, at 1800 today:   Continue Clinimix (no electrolytes) 5/15 at 40 ml/hr - will consider adding back lytes tomorrow if Mg improved  20% fat emulsion was stopped Monday 1/14  2nd Trig > 400 (465 1/14)   This provides 682 kcal and 48 gm protein for 40% kcal and 60 % protein needs  TPN to contain standard multivitamins and trace elements  IVF per MD  TPN lab panels on Mondays & Thursdays  F/u daily  Per discussion with CCS, TPN to continue until goals of care established  EPeggyann Juba PharmD, BCPS Pager: [phone]/18/2019 10:11 AM

## 2017-10-01 NOTE — Progress Notes (Signed)
Patient ID: Christie Maynard, female   DOB: 08/20/1956, 62 y.o.   MRN: [id number] [facility] Progress Note:   8 Days Post-Op  Subjective: Mental status is clear Objective: Vital signs in last 24 hours: Temp:  [97.2 F (36.2 C)-98.2 F (36.8 C)] 97.2 F (36.2 C) (01/18 1449) Pulse Rate:  [76-99] 99 (01/18 1449) Resp:  [22] 22 (01/18 0430) BP: (117-120)/(67-82) 117/82 (01/18 1449) SpO2:  [97 %-98 %] 97 % (01/18 1449)  Intake/Output from previous day: 01/17 0701 - 01/18 0700 In: 3399.3 [P.O.:2160; I.V.:1239.3] Out: 6575 [Urine:625; Drains:5950] Intake/Output this shift: Total I/O In: 840 [P.O.:840] Out: 26500 [Drains:26500]  Physical Exam: Work of breathing is not labored.  G tube is venting all PO intake.  No flatus yet  Lab Results:  Results for orders placed or performed during the hospital encounter of 09/14/17 (from the past 48 hour(s))  Comprehensive metabolic panel     Status: Abnormal   Collection Time: 09/30/17  7:05 AM  Result Value Ref Range   Sodium 137 135 - 145 mmol/L   Potassium 3.7 3.5 - 5.1 mmol/L   Chloride 98 (L) 101 - 111 mmol/L   CO2 29 22 - 32 mmol/L   Glucose, Bld 109 (H) 65 - 99 mg/dL   BUN 58 (H) 6 - 20 mg/dL   Creatinine, Ser 1.73 (H) 0.44 - 1.00 mg/dL   Calcium 8.0 (L) 8.9 - 10.3 mg/dL   Total Protein 5.6 (L) 6.5 - 8.1 g/dL   Albumin 1.4 (L) 3.5 - 5.0 g/dL   AST 64 (H) 15 - 41 U/L   ALT 72 (H) 14 - 54 U/L   Alkaline Phosphatase 226 (H) 38 - 126 U/L   Total Bilirubin 2.3 (H) 0.3 - 1.2 mg/dL   GFR calc non Af Amer 30 (L) >60 mL/min   GFR calc Af Amer 35 (L) >60 mL/min    Comment: (NOTE) The eGFR has been calculated using the CKD EPI equation. This calculation has not been validated in all clinical situations. eGFR's persistently <60 mL/min signify possible Chronic Kidney Disease.    Anion gap 10 5 - 15  Magnesium     Status: Abnormal   Collection Time: 09/30/17  7:05 AM  Result Value Ref Range   Magnesium 2.7 (H) 1.7 - 2.4  mg/dL  Phosphorus     Status: Abnormal   Collection Time: 09/30/17  7:05 AM  Result Value Ref Range   Phosphorus 5.0 (H) 2.5 - 4.6 mg/dL  Basic metabolic panel     Status: Abnormal   Collection Time: 09/30/17  7:05 AM  Result Value Ref Range   Sodium 137 135 - 145 mmol/L   Potassium 3.7 3.5 - 5.1 mmol/L   Chloride 99 (L) 101 - 111 mmol/L   CO2 27 22 - 32 mmol/L   Glucose, Bld 107 (H) 65 - 99 mg/dL   BUN 58 (H) 6 - 20 mg/dL   Creatinine, Ser 1.70 (H) 0.44 - 1.00 mg/dL   Calcium 8.1 (L) 8.9 - 10.3 mg/dL   GFR calc non Af Amer 31 (L) >60 mL/min   GFR calc Af Amer 36 (L) >60 mL/min    Comment: (NOTE) The eGFR has been calculated using the CKD EPI equation. This calculation has not been validated in all clinical situations. eGFR's persistently <60 mL/min signify possible Chronic Kidney Disease.    Anion gap 11 5 - 15  Comprehensive metabolic panel     Status: Abnormal   Collection Time:  10/01/17  4:44 AM  Result Value Ref Range   Sodium 137 135 - 145 mmol/L   Potassium 3.4 (L) 3.5 - 5.1 mmol/L   Chloride 97 (L) 101 - 111 mmol/L   CO2 29 22 - 32 mmol/L   Glucose, Bld 114 (H) 65 - 99 mg/dL   BUN 62 (H) 6 - 20 mg/dL   Creatinine, Ser 1.84 (H) 0.44 - 1.00 mg/dL   Calcium 8.0 (L) 8.9 - 10.3 mg/dL   Total Protein 5.9 (L) 6.5 - 8.1 g/dL   Albumin 1.5 (L) 3.5 - 5.0 g/dL   AST 54 (H) 15 - 41 U/L   ALT 66 (H) 14 - 54 U/L   Alkaline Phosphatase 246 (H) 38 - 126 U/L   Total Bilirubin 2.2 (H) 0.3 - 1.2 mg/dL   GFR calc non Af Amer 28 (L) >60 mL/min   GFR calc Af Amer 33 (L) >60 mL/min    Comment: (NOTE) The eGFR has been calculated using the CKD EPI equation. This calculation has not been validated in all clinical situations. eGFR's persistently <60 mL/min signify possible Chronic Kidney Disease.    Anion gap 11 5 - 15  Magnesium     Status: Abnormal   Collection Time: 10/01/17  4:44 AM  Result Value Ref Range   Magnesium 2.6 (H) 1.7 - 2.4 mg/dL  Phosphorus     Status: None    Collection Time: 10/01/17  4:44 AM  Result Value Ref Range   Phosphorus 4.3 2.5 - 4.6 mg/dL    Radiology/Results: US Renal  Result Date: 10/01/2017 CLINICAL DATA:  Acute renal injury.  History of ovarian carcinoma EXAM: RENAL ULTRASOUND COMPARISON:  CT abdomen and pelvis September 14, 2017 FINDINGS: Right Kidney: Length: 11.6 cm. Echogenicity is mildly increased. There is renal cortical thinning. No mass or perinephric fluid. Slight hydronephrosis visualized. There is a small extrarenal pelvis, seen on CT. No sonographically demonstrable calculus or ureterectasis. Left Kidney: Length: 11.8 cm. Echogenicity is mildly increased. There is renal cortical thinning. No mass or perinephric fluid visualized. There is moderate hydronephrosis on the left. No renal calculus or ureterectasis on the left. Bladder: Postvoid with bladder nonvisualized. Other: Moderate ascites present. There is a right adrenal mass measuring 3.0 x 1.8 x 2.6 cm, also seen on recent CT. IMPRESSION: 1. Each kidney shows increased echogenicity and cortical thinning, findings indicative of medical renal disease. 2. Moderate hydronephrosis on the left. Slight hydronephrosis on the right. Obstructing focus on each side not seen. 3.  Urinary bladder decompressed and not visualized. 4.  Right adrenal mass, unchanged from recent CT examination. 5.  Moderate ascites. Electronically Signed   By: Lowella Grip III M.D.   On: 10/01/2017 11:22    Anti-infectives: Anti-infectives (From admission, onward)   Start     Dose/Rate Route Frequency Ordered Stop   09/25/17 1200  piperacillin-tazobactam (ZOSYN) IVPB 3.375 g  Status:  Discontinued     3.375 g 12.5 mL/hr over 240 Minutes Intravenous Every 8 hours 09/25/17 1054 09/30/17 0852   09/23/17 1249  cefoTEtan in Dextrose 5% (CEFOTAN) 2-2.08 GM-%(50ML) IVPB    Comments:  [name]   : cabinet override      09/23/17 1249 09/24/17 0059   09/23/17 1100  cefoTEtan (CEFOTAN) 2 g in dextrose 5 %  50 mL IVPB     2 g 100 mL/hr over 30 Minutes Intravenous  Once 09/22/17 1549 09/23/17 1333      Assessment/Plan: Problem List: Patient Active Problem List  Diagnosis Date Noted  . Leukocytosis 09/25/2017  . SBO (small bowel obstruction) ([place]) 09/22/2017  . Colonic mass 09/22/2017  . Dehydration   . Neoplasm of colon, malignant ([place])   . Nausea and vomiting 09/15/2017  . Nausea & vomiting 09/14/2017  . Peritoneal carcinomatosis ([place]) 09/14/2017  . History of seizures 09/14/2017  . Protein-calorie malnutrition, severe ([place]) 09/08/2017  . Ovarian mass 09/05/2017  . Other ascites 09/03/2017  . Ascites 09/03/2017    She is 8 days post internal bypass;  No mechanical obstruction noted at the time of the bypass.  Should try empiric clamping of G tube to see if she will pass flatus or BM.  Have ordered clamping 4 hrs and to gravity 4 hrs with repeat.   8 Days Post-Op    LOS: 16 days   Matt B. Hassell Done, MD, [facility] [phone] beeper [phone]  10/01/2017 6:27 PM

## 2017-10-02 LAB — URINALYSIS, ROUTINE W REFLEX MICROSCOPIC
Bilirubin Urine: NEGATIVE
Glucose, UA: NEGATIVE mg/dL
Ketones, ur: NEGATIVE mg/dL
Nitrite: NEGATIVE
PH: 5 (ref 5.0–8.0)
PROTEIN: 30 mg/dL — AB
Specific Gravity, Urine: 1.014 (ref 1.005–1.030)

## 2017-10-02 LAB — CBC
HEMATOCRIT: 27.7 % — AB (ref 36.0–46.0)
HEMOGLOBIN: 9.1 g/dL — AB (ref 12.0–15.0)
MCH: 28 pg (ref 26.0–34.0)
MCHC: 32.9 g/dL (ref 30.0–36.0)
MCV: 85.2 fL (ref 78.0–100.0)
Platelets: 481 10*3/uL — ABNORMAL HIGH (ref 150–400)
RBC: 3.25 MIL/uL — ABNORMAL LOW (ref 3.87–5.11)
RDW: 16.6 % — ABNORMAL HIGH (ref 11.5–15.5)
WBC: 9 10*3/uL (ref 4.0–10.5)

## 2017-10-02 LAB — COMPREHENSIVE METABOLIC PANEL
ALT: 66 U/L — AB (ref 14–54)
AST: 56 U/L — AB (ref 15–41)
Albumin: 1.4 g/dL — ABNORMAL LOW (ref 3.5–5.0)
Alkaline Phosphatase: 272 U/L — ABNORMAL HIGH (ref 38–126)
Anion gap: 8 (ref 5–15)
BUN: 59 mg/dL — AB (ref 6–20)
CHLORIDE: 98 mmol/L — AB (ref 101–111)
CO2: 30 mmol/L (ref 22–32)
CREATININE: 1.8 mg/dL — AB (ref 0.44–1.00)
Calcium: 8 mg/dL — ABNORMAL LOW (ref 8.9–10.3)
GFR calc Af Amer: 33 mL/min — ABNORMAL LOW (ref >60)
GFR calc non Af Amer: 29 mL/min — ABNORMAL LOW (ref >60)
GLUCOSE: 114 mg/dL — AB (ref 65–99)
Potassium: 3.1 mmol/L — ABNORMAL LOW (ref 3.5–5.1)
Sodium: 136 mmol/L (ref 135–145)
Total Bilirubin: 2.2 mg/dL — ABNORMAL HIGH (ref 0.3–1.2)
Total Protein: 5.8 g/dL — ABNORMAL LOW (ref 6.5–8.1)

## 2017-10-02 LAB — PHOSPHORUS: Phosphorus: 3.6 mg/dL (ref 2.5–4.6)

## 2017-10-02 LAB — MAGNESIUM: Magnesium: 2.5 mg/dL — ABNORMAL HIGH (ref 1.7–2.4)

## 2017-10-02 MED ORDER — TRACE MINERALS CR-CU-MN-SE-ZN 10-1000-500-60 MCG/ML IV SOLN
INTRAVENOUS | Status: AC
  Administered 2017-10-02: 17:00:00 via INTRAVENOUS
  Filled 2017-10-02: qty 960

## 2017-10-02 MED ORDER — POTASSIUM CHLORIDE 20 MEQ/15ML (10%) PO SOLN
40.0000 meq | Freq: Four times a day (QID) | ORAL | Status: AC
  Administered 2017-10-02 (×2): 40 meq via ORAL
  Filled 2017-10-02 (×2): qty 30

## 2017-10-02 MED ORDER — POTASSIUM CHLORIDE CRYS ER 20 MEQ PO TBCR: 40.0000 meq | EXTENDED_RELEASE_TABLET | Freq: Four times a day (QID) | ORAL | Status: DC

## 2017-10-02 NOTE — Progress Notes (Signed)
PROGRESS NOTE        PATIENT DETAILS Name: Christie Maynard Age: 62 y.o. Sex: female Date of Birth: 1956-03-05 Admit Date: 09/14/2017 Admitting Physician A Melven Sartorius., MD [id number], Baldemar Friday., PA-C  Brief Narrative: Patient is a 62 y.o. 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 She was subsequently diagnosed with small bowel obstruction, further evaluation with a colonoscopy revealed a cecal mass. She unfortunately appears to have metastatic colon cancer (and not ovarian cancer). She underwent a exploratory laparotomy with palliative small bowel to right colon bypass and placement of a gastrostomy tube on 1/10. Postoperatively, she she has developed a prolonged ileus, anasarca/volume overload and acute kidney injury.See below for further details.   Subjective:  Patient in bed, appears comfortable, denies any headache, no fever, no chest pain or pressure, no shortness of breath , no abdominal pain. No focal weakness.  She is still unable to pass flatus or have a bowel movement.  Assessment/Plan:   SBO (small bowel obstruction)  secondary to a cecal mass with now postop ileus: Underwent exploratory laparotomy, and palliative small bowel to right colon bypass with placement of a gastrostomy tube on 1/10.  She continues to have a ileus postoperatively- without any BM or flatus yet .  Per general surgery not uncommon to have prolonged ileus in the setting of significant intra-abdominal malignancy.  Continue supportive care, monitor electrolytes, encourage activity and decrease narcotics, if all fails may try neostigmine.    Metastatic colon cancer/peritoneal carcinomatosis: She was initially following with Dr. 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 Medical oncology (Dr. Benay Spice) following.  Acute kidney injury: Creatinine continues to worsen slightly every day-she remains volume overloaded-she did get Lasix a few days back but this has been on hold.  Given worsening renal function-we will go ahead and repeat a UA and a renal ultrasound.  L ureteral obstruction s/p stent placement: Due to worsening creatinine-we will go ahead and repeat a renal ultrasound.  Leukocytosis: I suspect this is mostly reactive-probably secondary to inflammation from peritoneal carcinomatosis-recent surgery. Do not see any foci of infection apparent-spoke with general surgery-okay to stop Zosyn at this time and monitor off antimicrobial therapy.  Anemia: Likely secondary to acute illness on top of chronic disease. Hemoglobin stable. Continue to follow periodically.  Anasarca/lower extremity edema: Secondary to hypoalbuminemia/critical illness-continues to have significant lower extremity edema.  See plans above to continue to hold diuretics for now.    Transaminitis: Remain stable-slightly increase in alkaline phosphatase-but that could be from TNA.  Seizure disorder: Continue IV Dilantin-with plans to transition to oral route when she is able to tolerate more by mouth intake.    DVT Prophylaxis: Prophylactic Lovenox   Code Status: Full code   Family Communication: Spouse at bedside  Disposition Plan: Remain inpatient  Antimicrobial agents: Anti-infectives (From admission, onward)   Start     Dose/Rate Route Frequency Ordered Stop  09/25/17 1200  piperacillin-tazobactam (ZOSYN) IVPB 3.375 g  Status:  Discontinued     3.375 g 12.5  mL/hr over 240 Minutes Intravenous Every 8 hours 09/25/17 1054 09/30/17 0852   09/23/17 1249  cefoTEtan in Dextrose 5% (CEFOTAN) 2-2.08 GM-%(50ML) IVPB    Comments:  [name]   : cabinet override      09/23/17 1249 09/24/17 0059   09/23/17 1100  cefoTEtan (CEFOTAN) 2 g in dextrose 5 % 50 mL IVPB     2 g 100 mL/hr over 30 Minutes Intravenous  Once 09/22/17 1549 09/23/17 1333      Procedures:  CT-guided core biopsy of peritoneal tumor per Dr. Kathlene Cote 09/17/2017  Endoscopy per Dr. Cristina Gong 09/22/2016  Colonoscopy per Dr. Cristina Gong 09/22/2017  Exploratory laparotomy with palliative small bowel to right colon bypass with placement of gastrostomy tube per Dr. Marlou Starks 09/23/2017  CONSULTS:  Oncology   Gastroenterology   General surgery   GYN oncology  Palliative care   Time spent: 25- minutes-Greater than 50% of this time was spent in counseling, explanation of diagnosis, planning of further management, and coordination of care.  MEDICATIONS: Scheduled Meds: . enoxaparin (LOVENOX) injection  30 mg Subcutaneous Q24H  . feeding supplement  1 Container Oral TID BM  . phenytoin (DILANTIN) IV  100 mg Intravenous QHS  . potassium chloride  40 mEq Oral Q6H   Continuous Infusions: . Marland KitchenTPN (CLINIMIX-E) Adult    . sodium chloride 10 mL/hr at 09/25/17 1214  . TPN (CLINIMIX) Adult without lytes 40 mL/hr at 10/01/17 1732   PRN Meds:.morphine injection, morphine CONCENTRATE, ondansetron **OR** ondansetron (ZOFRAN) IV, sodium chloride flush   PHYSICAL EXAM: Vital signs: Vitals:   10/01/17 0430 10/01/17 1449 10/01/17 2231 10/02/17 0447  BP: 120/67 117/82 115/67 116/72  Pulse: 76 99 98 94  Resp: (!) 22  20 18   Temp: 98.2 F (36.8 C) (!) 97.2 F (36.2 C) 98.8 F (37.1 C) 99 F (37.2 C)  TempSrc: Oral Oral Oral Oral  SpO2: 97% 97% 97% 97%  Weight:      Height:       Filed Weights   09/14/17 1127 09/14/17 2110 09/22/17 1217  Weight: 68 kg (150 lb) 63.4 kg (139 lb 12.8 oz) 63 kg  (139 lb)   Body mass index is 25.42 kg/m.   Exam  Awake Alert, Oriented X 3, No new F.N deficits, Normal affect [place].AT,PERRAL Supple Neck,No JVD, No cervical lymphadenopathy appriciated.  Symmetrical Chest wall movement, Good air movement bilaterally, CTAB RRR,No Gallops, Rubs or new Murmurs, No Parasternal Heave +ve B.Sounds, Abd is distended, No tenderness, No organomegaly appriciated, No rebound - guarding or rigidity. No Cyanosis, Clubbing or edema, No new Rash or bruise  I have personally reviewed following labs and imaging studies  LABORATORY DATA: CBC: Recent Labs  Lab 09/26/17 0402 09/27/17 0449 09/28/17 0523 09/29/17 0303 10/02/17 0439  WBC 17.8* 16.3* 16.2* 15.2* 9.0  NEUTROABS 14.3* 13.4*  --   --   --   HGB 11.3* 11.1* 10.4* 9.7* 9.1*  HCT 34.5* 33.5* 30.5* 28.8* 27.7*  MCV 86.0 85.7 84.7 84.0 85.2  PLT 316 343 360 402* 481*    Basic Metabolic Panel: Recent Labs  Lab 09/28/17 0523 09/29/17 0303 09/30/17 0705 10/01/17 0444 10/02/17 0439  NA 134* 136 137  137 137 136  K 4.5 4.1 3.7  3.7 3.4* 3.1*  CL 100* 99* 98*  99* 97* 98*  CO2 23 27 29  27 29 30   GLUCOSE 126*  101* 109*  107* 114* 114*  BUN 53* 57* 58*  58* 62* 59*  CREATININE 1.23* 1.45* 1.73*  1.70* 1.84* 1.80*  CALCIUM 7.9* 8.2* 8.0*  8.1* 8.0* 8.0*  MG 2.5* 2.5* 2.7* 2.6* 2.5*  PHOS 6.2* 5.9* 5.0* 4.3 3.6    GFR: Estimated Creatinine Clearance: 27.9 mL/min (A) (by C-G formula based on SCr of 1.8 mg/dL (H)).  Liver Function Tests: Recent Labs  Lab 09/28/17 1052 09/29/17 0303 09/30/17 0705 10/01/17 0444 10/02/17 0439  AST 67* 63* 64* 54* 56*  ALT 83* 74* 72* 66* 66*  ALKPHOS 174* 186* 226* 246* 272*  BILITOT 2.5* 2.9* 2.3* 2.2* 2.2*  PROT 5.3* 5.5* 5.6* 5.9* 5.8*  ALBUMIN 1.3* 1.4* 1.4* 1.5* 1.4*   No results for input(s): LIPASE, AMYLASE in the last 168 hours. No results for input(s): AMMONIA in the last 168 hours.  Coagulation Profile: No results for input(s): INR,  PROTIME in the last 168 hours.  Cardiac Enzymes: No results for input(s): CKTOTAL, CKMB, CKMBINDEX, TROPONINI in the last 168 hours.  BNP (last 3 results) No results for input(s): PROBNP in the last 8760 hours.  HbA1C: No results for input(s): HGBA1C in the last 72 hours.  CBG: Recent Labs  Lab 09/26/17 0618 09/26/17 1237 09/26/17 1727 09/26/17 2305 09/27/17 0639  GLUCAP 126* 134* 134* 131* 137*    Lipid Profile: No results for input(s): CHOL, HDL, LDLCALC, TRIG, CHOLHDL, LDLDIRECT in the last 72 hours.  Thyroid Function Tests: No results for input(s): TSH, T4TOTAL, FREET4, T3FREE, THYROIDAB in the last 72 hours.  Anemia Panel: No results for input(s): VITAMINB12, FOLATE, FERRITIN, TIBC, IRON, RETICCTPCT in the last 72 hours.  Urine analysis:    Component Value Date/Time   COLORURINE AMBER (A) 10/02/2017 0008   APPEARANCEUR HAZY (A) 10/02/2017 0008   LABSPEC 1.014 10/02/2017 0008   PHURINE 5.0 10/02/2017 0008   GLUCOSEU NEGATIVE 10/02/2017 0008   HGBUR LARGE (A) 10/02/2017 0008   BILIRUBINUR NEGATIVE 10/02/2017 0008   KETONESUR NEGATIVE 10/02/2017 0008   PROTEINUR 30 (A) 10/02/2017 0008   NITRITE NEGATIVE 10/02/2017 0008   LEUKOCYTESUR TRACE (A) 10/02/2017 0008    Sepsis Labs: Lactic Acid, Venous    Component Value Date/Time   LATICACIDVEN 0.9 09/14/2017 2343    MICROBIOLOGY: Recent Results (from the past 240 hour(s))  Surgical PCR screen     Status: None   Collection Time: 09/23/17 12:03 AM  Result Value Ref Range Status   MRSA, PCR NEGATIVE NEGATIVE Final   Staphylococcus aureus NEGATIVE NEGATIVE Final    Comment: (NOTE) The Xpert SA Assay (FDA approved for NASAL specimens in patients 69 years of age and older), is one component of a comprehensive surveillance program. It is not intended to diagnose infection nor to guide or monitor treatment.   Culture, Urine     Status: None   Collection Time: 09/25/17 11:30 AM  Result Value Ref Range Status    Specimen Description URINE, RANDOM  Final   Special Requests NONE  Final   Culture   Final    NO GROWTH Performed at [facility], [address] [place], [place] [address]    Report Status 09/26/2017 FINAL  Final    RADIOLOGY STUDIES/RESULTS: Dg Abd 1 View  Result Date: 09/15/2017 CLINICAL DATA:  Nasogastric tube placement.  Pelvic malignancy. EXAM: ABDOMEN - 1 VIEW COMPARISON:  Radiographs and CT 09/14/2017. FINDINGS: 1537 hours. Enteric tube projects below the diaphragm with tip in the left subphrenic region, likely  in the gastric fundus. There is a double-J left ureteral stent. Mild bowel wall thickening is present in the left mid abdomen. No supine evidence of free intraperitoneal air. Right pelvic calcifications consistent with phleboliths are stable. IMPRESSION: Enteric tube projects over the left upper quadrant of the abdomen, likely in the gastric fundus. Electronically Signed   By: Richardean Sale M.D.   On: 09/15/2017 16:11   Ct Abdomen Pelvis W Contrast  Result Date: 09/14/2017 CLINICAL DATA:  Abdominal swelling and vomiting. Increasing weakness. Patient is recently been diagnosed with cervical cancer. Paracentesis 1 day ago and last week. EXAM: CT ABDOMEN AND PELVIS WITH CONTRAST TECHNIQUE: Multidetector CT imaging of the abdomen and pelvis was performed using the standard protocol following bolus administration of intravenous contrast. CONTRAST:  68mL ISOVUE-300 IOPAMIDOL (ISOVUE-300) INJECTION 61% COMPARISON:  09/03/2017 FINDINGS: Lower chest: Small bilateral pleural effusions, greater on the right. Mild basilar atelectasis is likely compressive. Moderate-sized esophageal hiatal hernia. Hepatobiliary: 1 cm diameter hypo dense lesion in segment 4 of the liver may represent a metastatic focus. No other focal liver lesions identified. Gallbladder and bile ducts are unremarkable. Pancreas: Pancreas is atrophic. No focal lesion or inflammatory changes appreciated. Spleen: Spleen size  is normal.  No focal lesions. Adrenals/Urinary Tract: Right adrenal gland nodule measuring 2.2 cm in diameter. Hounsfield unit measurements on portal venous and delayed phase imaging demonstrate a relative washout of 55%. Relative washout of 40% or greater suggests benign adenoma. Renal nephrograms are homogeneous. Mildly dilated left intrarenal collecting system. A left ureteral stent is in place. Bladder is unremarkable. Stomach/Bowel: Stomach is not abnormally distended. There is diffuse dilatation of fluid-filled small bowel with decompressed terminal ileum. This is progressing since the previous study. Transition zone appears to be in the right lower quadrant. Appearance is consistent with small bowel obstruction. Cause of obstruction may be extrinsic compression of the small bowel, metastasis, or direct invasion. Colon is decompressed. Small amount of residual contrast material in the colon. Appendix is normal. Vascular/Lymphatic: No significant vascular findings are present. No enlarged abdominal or pelvic lymph nodes. Reproductive: Large heterogeneous pelvic mass extending above the uterus. This likely arises from the right ovary and appears to demonstrate direct invasion of the uterine fundus on the left. The mass measures about 6.7 by 10.2 x 10.7 cm. Other: Right lower quadrant mesenteric mass measuring 4.4 cm in diameter. Diffuse nodular infiltration throughout the omentum and mesenteric fat consistent with diffuse peritoneal metastasis. Small amount of free fluid throughout the abdomen and pelvis. No free air. Abdominal wall musculature appears intact. Musculoskeletal: Mild degenerative changes in the spine. Heterogeneous appearance of the proximal left femur with trabecular coarsening. Appearance is most consistent with Paget's disease although metastatic lesion could also have this appearance. IMPRESSION: 1. Large heterogeneous pelvic mass lesion consistent with malignancy, likely ovarian in origin.  Probable direct invasion of the uterus. 2. Diffuse peritoneal carcinomatosis with nodular infiltration of the omentum and mesentery and diffuse peritoneal fluid. Right lower quadrant mass measuring 4.4 cm diameter, likely metastatic. 3. Progressing small bowel obstruction with diffusely dilated fluid-filled small bowel. Transition zone is in the right lower quadrant. 4. Right adrenal gland nodule has characteristics suggesting a benign adenoma. 5. Bone changes in the proximal left femur probably represent Paget's disease although bone metastasis is not excluded. Consider bone scan for further evaluation if clinically indicated. 6. Left 3 atrial stent in place with mild residual hydronephrosis. 7. 1 cm low-attenuation lesion in the left lobe of the liver suspicious for metastasis.  Electronically Signed   By: Lucienne Capers M.D.   On: 09/14/2017 19:29   Ct Abdomen Pelvis W Contrast  Result Date: 09/03/2017 CLINICAL DATA:  New onset ascites, epigastric pain, nausea, and vomiting for 3 days, abdominal distension swelling, recent UTI EXAM: CT ABDOMEN AND PELVIS WITH CONTRAST TECHNIQUE: Multidetector CT imaging of the abdomen and pelvis was performed using the standard protocol following bolus administration of intravenous contrast. Sagittal and coronal MPR images reconstructed from axial data set. CONTRAST:  100 ML ISOVUE-300 IOPAMIDOL (ISOVUE-300) INJECTION 61% IV. No oral contrast given. COMPARISON:  None FINDINGS: Lower chest: Tiny BILATERAL pleural effusions larger on RIGHT. Subsegmental atelectasis at LEFT base. Hepatobiliary: Dependent density within gallbladder question calculi or sludge. Liver normal appearance. No biliary dilatation. Pancreas: Normal appearance Spleen: Normal appearance Adrenals/Urinary Tract: RIGHT adrenal mass 22 x 19 mm demonstrating significant washout on delayed images consistent with adenoma. LEFT adrenal gland unremarkable. LEFT hydronephrosis and hydroureter present. No RIGHT-side  urinary tract dilatation. LEFT ureteral dilatation terminates at a mass in the pelvis see below. Inhomogeneous LEFT nephrogram question pyelonephritis. No renal mass lesions. Bladder decompressed. Stomach/Bowel: Appendix not localized. Colon decompressed though displaced medially especially RIGHT by ascites. Moderate to large hiatal hernia. Decompressed proximal and minimally dilated distal small bowel loops suggesting mild degree of small bowel obstruction. Transition zone appears to be within the RIGHT pelvis at the level of the distal ileum. No definite bowel wall thickening Vascular/Lymphatic: Aorta normal caliber.  No definite adenopathy. Reproductive: Irregular heterogeneous nodular appearing soft tissue masses in the adnexa bilaterally with nodular areas of peripheral irregular enhancement compatible with ovarian neoplasm. It is uncertain whether this represents a single large multilobulated mass which extends across the midline in both adnexal regions or contiguous BILATERAL ovarian masses. The larger mass portion to the RIGHT of midline measures 10.6 x 4.9 x 9.8 cm and the smaller component to the LEFT of midline measures 8.2 x 4.9 x 4.8 cm. Tumor masses are contiguous with the uterus. Mildly complicated cystic lesion at the LEFT lateral aspect of the uterus 3.1 x 2.4 cm image 74 could be of uterine or ovarian origin. Other: Large amount of ascites. Fluid in lesser sac. Diffuse omental caking by tumor. No free air. No hernia. Musculoskeletal: No acute osseous findings. Prominent epidural vessels within the lumbar spinal canal. 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 Associated ascites and omental caking consistent with peritoneal  carcinomatosis. Suspect mild distal small bowel obstruction. Distal LEFT ureteral obstruction with LEFT hydronephrosis and hydroureter. Patchy LEFT nephrogram most likely representing pyelonephritis recommend correlation with urinalysis. Sludge versus dependent calculi within gallbladder. Large hiatal hernia. Tiny BILATERAL pleural effusions. Findings called to Dr. Broadus John on 09/03/2017 at 1741 hours. Electronically Signed   By: Lavonia Dana M.D.   On: 09/03/2017 17:48   US Renal  Result Date: 10/01/2017 CLINICAL DATA:  Acute renal injury.  History of ovarian carcinoma EXAM: RENAL ULTRASOUND COMPARISON:  CT abdomen and pelvis September 14, 2017 FINDINGS: Right Kidney: Length: 11.6 cm. Echogenicity is mildly increased. There is renal cortical thinning. No mass or perinephric fluid. Slight hydronephrosis visualized. There is a small extrarenal pelvis, seen on CT. No sonographically demonstrable calculus or ureterectasis. Left Kidney: Length: 11.8 cm. Echogenicity is mildly increased. There  is renal cortical thinning. No mass or perinephric fluid visualized. There is moderate hydronephrosis on the left. No renal calculus or ureterectasis on the left. Bladder: Postvoid with bladder nonvisualized. Other: Moderate ascites present. There is a right adrenal mass measuring 3.0 x 1.8 x 2.6 cm, also seen on recent CT. IMPRESSION: 1. Each kidney shows increased echogenicity and cortical thinning, findings indicative of medical renal disease. 2. Moderate hydronephrosis on the left. Slight hydronephrosis on the right. Obstructing focus on each side not seen. 3.  Urinary bladder decompressed and not visualized. 4.  Right adrenal mass, unchanged from recent CT examination. 5.  Moderate ascites. Electronically Signed   By: Lowella Grip III M.D.   On: 10/01/2017 11:22   US Paracentesis  Result Date: 09/04/2017 INDICATION: Pelvic mass, carcinomatosis and large volume ascites. EXAM: ULTRASOUND GUIDED PARACENTESIS MEDICATIONS:  None. COMPLICATIONS: None immediate. PROCEDURE: Informed written consent was obtained from the patient after a discussion of the risks, benefits and alternatives to treatment. A timeout was performed prior to the initiation of the procedure. Initial ultrasound was performed to localize ascites. The right lower abdomen was prepped and draped in the usual sterile fashion. 1% lidocaine was used for local anesthesia. Following this, a 6 Fr Safe-T-Centesis catheter was introduced. An ultrasound image was saved for documentation purposes. The paracentesis was performed. The catheter was removed and a dressing was applied. The patient tolerated the procedure well without immediate post procedural complication. FINDINGS: A total of approximately 4.8 L of amber, slightly blood tinged fluid was removed. Samples were sent to the laboratory as requested by the clinical team. IMPRESSION: Successful ultrasound-guided paracentesis yielding 4.8 liters of peritoneal fluid. Electronically Signed   By: Aletta Edouard M.D.   On: 09/04/2017 13:31   Ct Biopsy  Result Date: 09/17/2017 CLINICAL DATA:  Pelvic mass, diffuse peritoneal carcinomatosis and ascites. Prior cytology of peritoneal fluid was not conclusive for gynecologic malignancy and suggested potential GI origin. Request has now been made to perform biopsy of peritoneal tumor for more definitive diagnosis. EXAM: CT GUIDED CORE BIOPSY OF PERITONEAL MASS COMPARISON:  CT of the abdomen and pelvis on 09/14/2017 and 09/03/2017 ANESTHESIA/SEDATION: 2.0 mg IV Versed; 100 mcg IV Fentanyl Total Moderate Sedation Time:  20 minutes. The patient's level of consciousness and physiologic status were continuously monitored during the procedure by Radiology nursing. PROCEDURE: The procedure risks, benefits, and alternatives were explained to the patient. Questions regarding the procedure were encouraged and answered. The patient understands and consents to the procedure. The abdominal wall  was prepped with chlorhexidine in a sterile fashion, and a sterile drape was applied covering the operative field. A sterile gown and sterile gloves were used for the procedure. Local anesthesia was provided with 1% Lidocaine. CT was performed in a supine position. Under CT guidance, a 95 gauge trocar needle was advanced from an anterior oblique position into the left anterior peritoneal cavity. Core biopsy was performed with an 18 gauge core biopsy needle device. A total of 4 samples were obtained and submitted in formalin. Additional CT images were obtained after outer needle removal. COMPLICATIONS: None FINDINGS: Small bowel shows decompression since prior CT on 09/14/2017 after nasogastric decompression. This now allows for a safe window to approach of peritoneal tumor in the anterior peritoneal cavity/omental region. Tumor just deep to the left anterior abdominal wall was targeted and revealed solid tissue with biopsy. IMPRESSION: CT-guided core biopsy performed of peritoneal tumor. Electronically Signed   By: Aletta Edouard M.D.   On: 09/17/2017  16:26   Dg Chest Port 1 View  Result Date: 09/25/2017 CLINICAL DATA:  Leukocytosis and status post laparotomy and bowel bypass for colon carcinoma. EXAM: PORTABLE CHEST 1 VIEW COMPARISON:  08/30/2017 FINDINGS: Right arm PICC line extends to the distal SVC. The heart size and mediastinal contours are within normal limits. Lung volumes are low with bibasilar atelectasis present. There may be small bilateral pleural effusions. There is no evidence of pulmonary edema, consolidation, pneumothorax or nodules. The visualized skeletal structures are unremarkable. IMPRESSION: Low lung volumes with bibasilar atelectasis. Possible small bilateral pleural effusions. Electronically Signed   By: Aletta Edouard M.D.   On: 09/25/2017 10:39   Dg Abd 2 Views  Result Date: 09/21/2017 CLINICAL DATA:  Follow-up small bowel obstruction. Clinically improved. EXAM: ABDOMEN - 2 VIEW  COMPARISON:  Abdominal radiograph of January 2nd 2019 and CT scan of the abdomen of September 17, 2017. FINDINGS: There remain loops of mildly distended gas-filled small bowel to the right of midline and in the mid abdomen. There is a gas within the transverse colon and a small amount of gas within the rectum. There is gas within a hiatal hernia. The esophagogastric tube tip and proximal port project below the hemidiaphragms. No free extraluminal gas collections are observed. A double pigtail stent is present in the left kidney and ureter extending into the bladder. IMPRESSION: Persistent partial distal small bowel obstruction. No evidence of perforation. Hiatal hernia.  The esophagogastric tube is in reasonable position. Electronically Signed   By: David  Martinique M.D.   On: 09/21/2017 13:12   Dg Abd Acute W/chest  Result Date: 09/14/2017 CLINICAL DATA:  Vomiting. EXAM: DG ABDOMEN ACUTE W/ 1V CHEST COMPARISON:  08/26/2017 FINDINGS: Left-sided nephroureteral stent is identified. Small bowel air-fluid levels are identified. Small bowel loops are mildly increased in caliber measuring up to 2.6 cm. There is no evidence of free intraperitoneal air. Heart size and mediastinal contours are within normal limits. There are bilateral pleural effusions noted right greater than left. Both lungs are clear. IMPRESSION: 1. Mildly increased caliber of small bowel loops with air-fluid levels. Cannot rule out low grade partial obstruction. 2. Status post left ureteral stenting. 3. Bilateral pleural effusions. Electronically Signed   By: Kerby Moors M.D.   On: 09/14/2017 16:58   Dg C-arm 1-60 Min-no Report  Result Date: 09/05/2017 Fluoroscopy was utilized by the requesting physician.  No radiographic interpretation.   US Abdomen Limited Ruq  Result Date: 09/03/2017 CLINICAL DATA:  Pancreatitis EXAM: ULTRASOUND ABDOMEN LIMITED RIGHT UPPER QUADRANT COMPARISON:  None. FINDINGS: Gallbladder: Sludge in the gallbladder. Normal  wall thickness. Negative sonographic Murphy. Common bile duct: Diameter: 2.5 mm Liver: Parenchymal echogenicity within normal limits. On some of the images, nodular liver contour is suggested. Portal vein is patent on color Doppler imaging with normal direction of blood flow towards the liver. The right pleural effusion is incidentally noted. There is moderate ascites in the right upper quadrant. IMPRESSION: 1. Sludge in the gallbladder. Negative for wall thickening, tenderness, or biliary dilatation 2. Right pleural effusion and moderate ascites in the right upper quadrant 3. On some of the images, liver contour appears slightly nodular, query hepatocellular disease. Electronically Signed   By: Donavan Foil M.D.   On: 09/03/2017 14:56   Ir Paracentesis  Result Date: 09/13/2017 INDICATION: Patient with recurrent ascites. Request is made for diagnostic and therapeutic paracentesis. EXAM: ULTRASOUND GUIDED DIAGNOSTIC AND THERAPEUTIC PARACENTESIS MEDICATIONS: 10 mL 2% lidocaine COMPLICATIONS: None immediate. PROCEDURE: Informed written consent was  obtained from the patient after a discussion of the risks, benefits and alternatives to treatment. A timeout was performed prior to the initiation of the procedure. Initial ultrasound scanning demonstrates a large amount of ascites within the left lateral abdomen. The left lateral abdomen was prepped and draped in the usual sterile fashion. 2% lidocaine was used for local anesthesia. Following this, a 19 gauge, 7-cm, Yueh catheter was introduced. An ultrasound image was saved for documentation purposes. The paracentesis was performed. The catheter was removed and a dressing was applied. The patient tolerated the procedure well without immediate post procedural complication. FINDINGS: A total of approximately 2.0 liters of red-colored fluid was removed. Samples were sent to the laboratory as requested by the clinical team. IMPRESSION: Successful ultrasound-guided  paracentesis yielding 2.0 liters of peritoneal fluid. Read by:  Brynda Greathouse PA-C Electronically Signed   By: Jerilynn Mages.  Shick M.D.   On: 09/13/2017 15:21     LOS: 17 days   Signature  Lala Lund M.D on 10/02/2017 at 11:27 AM  Between 7am to 7pm - Pager - [phone] ( page via [email or web address], text pages only, please mention full 10 digit call back number).  After 7pm go to [email or web address] - password [facility]

## 2017-10-02 NOTE — Progress Notes (Signed)
PHARMACY - ADULT TOTAL PARENTERAL NUTRITION CONSULT NOTE & ANTIBIOTIC NOTE  Pharmacy Consult for TPN  Indication: bowel obstruction  Patient Measurements: Height: 5\' 2"  (157.5 cm) Weight: 139 lb (63 kg) IBW/kg (Calculated) : 50.1 TPN AdjBW (KG): 53.3 Body mass index is 25.42 kg/m. 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 CT abdomen/pelviswith contrast shows progressing SBO. Having BMs but with 1450 ml from NGT, Surgery recommends starting TPN. Patient with significant recent weight loss but primarily d/t multiple large-volume paracentesis. Poor intake since week before Xmas, and unable to keep anything down since 12/31. Appears to be at risk for refeeding.  Significant events:  1/9 endoscopy/colonoscopy showed mass in cecum 1/10:  Exp lap with palliative small bowel to right colon bypass with placement of gastrostomy 1/14 continue g tube to drain and allow to drink for comfort, tolerating clear liquids; lipids stopped 1/15 take lytes out of TPN, rate to 1/2 per CCS 2nd elevated LFTs.  Per CCS notes, GI tract not functioning at this time. GI function may never return due to tumor burden, malignant SBO, with nothing left to do surgically. TPN is not indicated in this situation but has not been addressed with family. 1/19: still no return of bowel function, CCS to try clamping G tube, still ongoing discussion of patient/family with palliative care  team   Recent Labs    10/01/17 0444 10/02/17 0439  NA 137 136  K 3.4* 3.1*  CL 97* 98*  CO2 29 30  GLUCOSE 114* 114*  BUN 62* 59*  CREATININE 1.84* 1.80*  CALCIUM 8.0* 8.0*  PHOS 4.3 3.6  MG 2.6* 2.5*  ALBUMIN 1.5* 1.4*  ALKPHOS 246* 272*  AST 54* 56*  ALT 66* 66*  BILITOT 2.2* 2.2*   Today, 10/02/2017:   Glucose (goal <150):  DC SSI/CBGs 1/14, blood gluc 114  Electrolytes: removed from TPN formula 1/15  K 3.1 (decreased) - orders per Md  Phos 3.6 now improved to wnl   CorrCa 10.1 (CaxPhos product 36 - goal < 55)  Mg elevated at 2.5 trending downward slowly   Na 136 (improved)   Renal: SCr up to 1.80, UOP down, given lasix 40 on 1/14, 20mg  1/15;  drains out 5950 ml, 1 BM recorded 1/13 In Epic  but pt denies having BM/flatus  LFTs: slightly elevated on admit, AST/ALT and tbili elevated but stable, alkP 272 rising  TGs: elevated 180 (1/7) 465 (1/14) - not receiving lipids currently  Prealbumin: 8.4 (1.7)  and 6.1 on 1/14  NUTRITIONAL GOALS                                                                                             RD recs (09/20/17):  Kcal:  1700-1900 Protein:  80-90 grams Fluid:  >/= 1.7 L/d  Clinimix-E 5/15 at a goal rate of 75 ml/hr + 20% fat emulsion 240 ml / day to provide: 90 g/day protein, 1758 Kcal/day.  PLAN                                                                                  Now: KDur 63meq PO x 2 doses today ordered per Md  Today, at 1800 today:  Continue Clinimix 5/15 at 40 ml/hr and will add lytes back to TPN with monitoring of mag closely  20% fat emulsion was stopped Monday 1/14  2nd Trig > 400 (465 1/14)   This provides 682 kcal and 48 gm protein for 40% kcal and 60 % protein needs  TPN to contain standard multivitamins and trace elements  IVF per MD  TPN lab panels on Mondays & Thursdays  F/u daily  Per discussion with CCS, TPN to continue until goals of care established   Adrian Saran, PharmD,  BCPS Pager [phone] 10/02/2017 10:43 AM

## 2017-10-02 NOTE — Progress Notes (Signed)
Patient walked out in the hallway approximately 50 feet, tolerated well.  Will continue to push patient to walk and educated patient this is the most important thing to do for her bowel motility.

## 2017-10-02 NOTE — Progress Notes (Signed)
[facility]:  [phone] General Surgery Progress Note   LOS: 17 days  POD -  9 Days Post-Op  Chief Complaint: Obstruction  Assessment and Plan: 1.  EXPLORATORY LAPAROTOMY WITH PALLIATIVE SMALL BOWEL TO RIGHT COLON BY PASS WITH PLACEMENT OF GASTROSTOMY TUBE - 09/23/2017 Christie Maynard  For metastatic cancer  G tube on interval clamping  Still no bowel function - she needs to ambulate mre  2.  Anemia  Hgb - 9.1 - 10/02/2017 3.  Acute renal injury  Creatinine - 1.8 - 10/02/2017  Left ureteral stent placed by Dr. Alinda Money - 09/05/2017 4.  Malnutrition - on TPN  Albumin - 1.4 - 10/02/2017 5.  DVT prophylaxis - Lovenox 6.  History of seizure disorder   Principal Problem:   Peritoneal carcinomatosis ([place]) Active Problems:   Ascites   Protein-calorie malnutrition, severe ([facility])   Nausea & vomiting   History of seizures   Nausea and vomiting   SBO (small bowel obstruction) (HCC)   Colonic mass   Dehydration   Neoplasm of colon, malignant (HCC)   Leukocytosis  Subjective:  Comfortable, though distended.  No flatus or BM.  Objective:   Vitals:   10/01/17 2231 10/02/17 0447  BP: 115/67 116/72  Pulse: 98 94  Resp: 20 18  Temp: 98.8 F (37.1 C) 99 F (37.2 C)  SpO2: 97% 97%     Intake/Output from previous day:  01/18 0701 - 01/19 0700 In: 850 [P.O.:840; I.V.:10] Out: 26900 [Urine:400; Drains:26500]  Intake/Output this shift:  Total I/O In: 360 [P.O.:360] Out: 1400 [Urine:200; Drains:1200]   Physical Exam:   General: Older WF who is alert and oriented.    HEENT: Normal. Pupils equal. .   Lungs: Clear   Abdomen: Distended.  Rare BS   Wound: Clean.   G tube in LUQ - 1,200 our of G tube.  It is being alternately clamped.   Lab Results:    Recent Labs    10/02/17 0439  WBC 9.0  HGB 9.1*  HCT 27.7*  PLT 481*    BMET   Recent Labs    10/01/17 0444 10/02/17 0439  NA 137 136  K 3.4* 3.1*  CL 97* 98*  CO2 29 30  GLUCOSE 114* 114*  BUN 62*  59*  CREATININE 1.84* 1.80*  CALCIUM 8.0* 8.0*    PT/INR  No results for input(s): LABPROT, INR in the last 72 hours.  ABG  No results for input(s): PHART, HCO3 in the last 72 hours.  Invalid input(s): PCO2, PO2   Studies/Results:  US Renal  Result Date: 10/01/2017 CLINICAL DATA:  Acute renal injury.  History of ovarian carcinoma EXAM: RENAL ULTRASOUND COMPARISON:  CT abdomen and pelvis September 14, 2017 FINDINGS: Right Kidney: Length: 11.6 cm. Echogenicity is mildly increased. There is renal cortical thinning. No mass or perinephric fluid. Slight hydronephrosis visualized. There is a small extrarenal pelvis, seen on CT. No sonographically demonstrable calculus or ureterectasis. Left Kidney: Length: 11.8 cm. Echogenicity is mildly increased. There is renal cortical thinning. No mass or perinephric fluid visualized. There is moderate hydronephrosis on the left. No renal calculus or ureterectasis on the left. Bladder: Postvoid with bladder nonvisualized. Other: Moderate ascites present. There is a right adrenal mass measuring 3.0 x 1.8 x 2.6 cm, also seen on recent CT. IMPRESSION: 1. Each kidney shows increased echogenicity and cortical thinning, findings indicative of medical renal disease. 2. Moderate hydronephrosis on the left. Slight hydronephrosis on the right. Obstructing focus on each side  not seen. 3.  Urinary bladder decompressed and not visualized. 4.  Right adrenal mass, unchanged from recent CT examination. 5.  Moderate ascites. Electronically Signed   By: Lowella Grip III M.D.   On: 10/01/2017 11:22     Anti-infectives:   Anti-infectives (From admission, onward)   Start     Dose/Rate Route Frequency Ordered Stop   09/25/17 1200  piperacillin-tazobactam (ZOSYN) IVPB 3.375 g  Status:  Discontinued     3.375 g 12.5 mL/hr over 240 Minutes Intravenous Every 8 hours 09/25/17 1054 09/30/17 0852   09/23/17 1249  cefoTEtan in Dextrose 5% (CEFOTAN) 2-2.08 GM-%(50ML) IVPB    Comments:   [name]   : cabinet override      09/23/17 1249 09/24/17 0059   09/23/17 1100  cefoTEtan (CEFOTAN) 2 g in dextrose 5 % 50 mL IVPB     2 g 100 mL/hr over 30 Minutes Intravenous  Once 09/22/17 1549 09/23/17 1333      Alphonsa Overall, MD, FACS Pager: [facility]: [phone] 10/02/2017

## 2017-10-02 NOTE — Consult Note (Signed)
Consultation Note Date: 10/02/2017   Patient Name: Christie Maynard  DOB: February 18, 1956  MRN: [id number]  Age / Sex: 62 y.o., female  PCP: Aletha Halim., PA-C Referring Physician: Thurnell Lose, MD  Reason for Consultation: Establishing goals of care and Psychosocial/spiritual support  HPI/Patient Profile: 62 y.o. female  with past medical history of only diagnosed ovarian cancer now found to have GI cancer with significant peritoneal carcinomatosis and left ureteral obstruction status post stent placement as well as small bowel to right colon bypass with placement of gastrostomy tube on 1/10.  Bowel function has not returned she is having output from G-tube.  Palliative consulted for goals of care.  Clinical Assessment and Goals of Care: I met today with [name], her husband, and her sister.  She reports that the most important things to her are her family.  She has 1 son as well as a 82-year-old granddaughter whom she helps watch and "means the world to me."  She feels that her doctors have been doing a good job explaining things to her, and she is able to voice that she has incurable disease.  She remains hopeful that she will be able to receive some disease modifying therapy in order to add time in quality to her life.  In onset of conversation, I met with her husband and sister in the hall.  I then entered the room and began discussion with patient.  At this time, Dr. Hassell Done rounded as well and spoke primarily with her husband while I was talking with her.  Her husband reports plan to clamp the G-tube in order to see if she has any return of function.  She and her husband are very hopeful that this may promote more motility and are focused on having this trial prior to having any other conversations regarding long-term goals of care.  We did discuss that, while I also remain hopeful that this will  improve her bowel function, is a real possibility that that may not be the case and we need to continue to progress conversation if it becomes apparent over the next few days that this is not something that is likely to improve.  SUMMARY OF RECOMMENDATIONS   -[name] appears to have good understanding of her clinical situation. -She remains hopeful that she will have return of bowel function and be able to pursue disease modifying therapy with Dr. Benay Spice.  Plan per surgery is to begin for hour trials of clamping of tube to see how she tolerates. -She understands that if bowel function does not return, this will significantly change her prognosis and options for care moving forward. -She is agreeable to be returning, however, she feels as though she needs time to see if her bowel function will return prior to having any other conversations with me or changing any of her long-term goals of care. -We agreed that I will check in on Monday to see how she is doing and continue to progress the conversation based upon her clinical course  over the weekend. -Family has my card and will call if they would like to meet sooner. -I am on-call this weekend and would be happy to meet further with Ms. Desouza if there are specific areas with which I can be of assistance at this time.  Code Status/Advance Care Planning:  Full code  Palliative Prophylaxis:   Frequent Pain Assessment  Additional Recommendations (Limitations, Scope, Preferences):  Full Scope Treatment  Psycho-social/Spiritual:   Desire for further Chaplaincy support: Not Addressed today  Additional Recommendations: Caregiving  Support/Resources  Prognosis:   Guarded  Discharge Planning: To Be Determined      Primary Diagnoses: Present on Admission: . Nausea & vomiting . Peritoneal carcinomatosis ([place]) . Ascites . Nausea and vomiting . SBO (small bowel obstruction) ([place]) . Colonic mass . Neoplasm of colon, malignant  ([place]) . Protein-calorie malnutrition, severe ([place])   I have reviewed the medical record, interviewed the patient and family, and examined the patient. The following aspects are pertinent.  Past Medical History:  Diagnosis Date  . Cancer ([place])   . Ovarian ca ([place])   . Seizures ([place])   . UTI (urinary tract infection)    Social History   Socioeconomic History  . Marital status: Single    Spouse name: None  . Number of children: None  . Years of education: None  . Highest education level: None  Social Needs  . Financial resource strain: None  . Food insecurity - worry: None  . Food insecurity - inability: None  . Transportation needs - medical: None  . Transportation needs - non-medical: None  Occupational History  . None  Tobacco Use  . Smoking status: Never Smoker  . Smokeless tobacco: Never Used  Substance and Sexual Activity  . Alcohol use: No    Frequency: Never  . Drug use: No  . Sexual activity: None  Other Topics Concern  . None  Social History Narrative  . None   Family History  Problem Relation Age of Onset  . Breast cancer Mother   . Lung cancer Father   . Breast cancer Sister   . Breast cancer Maternal Aunt   . Breast cancer Paternal Aunt   . Cancer Paternal Uncle    Scheduled Meds: . enoxaparin (LOVENOX) injection  30 mg Subcutaneous Q24H  . feeding supplement  1 Container Oral TID BM  . phenytoin (DILANTIN) IV  100 mg Intravenous QHS  . potassium chloride  40 mEq Oral Q6H   Continuous Infusions: . sodium chloride 10 mL/hr at 09/25/17 1214  . TPN (CLINIMIX) Adult without lytes 40 mL/hr at 10/01/17 1732   PRN Meds:.morphine injection, morphine CONCENTRATE, ondansetron **OR** ondansetron (ZOFRAN) IV, sodium chloride flush Medications Prior to Admission:  Prior to Admission medications   Medication Sig Start Date End Date Taking? Authorizing Provider  ibuprofen (ADVIL,MOTRIN) 200 MG tablet Take 400 mg by mouth every 6 (six) hours as needed for  headache.   Yes [provider]  ondansetron (ZOFRAN) 4 MG tablet Take 1 tablet (4 mg total) by mouth every 6 (six) hours as needed for nausea. 09/05/17  Yes Aline August, MD  phenytoin (DILANTIN) 100 MG ER capsule Take 100 mg by mouth daily. 01/18/17  Yes [provider]  traMADol (ULTRAM) 50 MG tablet Take 1 tablet (50 mg total) by mouth every 6 (six) hours as needed for moderate pain. 09/05/17 09/05/18 Yes Aline August, MD   No Known Allergies Review of Systems  Constitutional: Positive for activity change, appetite change,  fatigue and unexpected weight change.  Gastrointestinal: Positive for abdominal distention, abdominal pain, constipation, nausea and vomiting.  Neurological: Positive for weakness.  Psychiatric/Behavioral: Positive for sleep disturbance.    Physical Exam  General: Alert, awake, in no acute distress.  HEENT: No bruits, no goiter, no JVD Heart: Regular rate and rhythm. No murmur appreciated. Lungs: Good air movement, clear Abdomen: Soft, nontender, G tube in place Ext: + edema Skin: Warm and dry Neuro: Grossly intact, nonfocal.   Vital Signs: BP 116/72 (BP Location: Left Arm)   Pulse 94   Temp 99 F (37.2 C) (Oral)   Resp 18   Ht 5' 2"  (1.575 m)   Wt 63 kg (139 lb)   SpO2 97%   BMI 25.42 kg/m  Pain Assessment: No/denies pain POSS *See Group Information*: S-Acceptable,Sleep, easy to arouse Pain Score: 0-No pain   SpO2: SpO2: 97 % O2 Device:SpO2: 97 % O2 Flow Rate: .O2 Flow Rate (L/min): 0 L/min  IO: Intake/output summary:   Intake/Output Summary (Last 24 hours) at 10/02/2017 0955 Last data filed at 10/02/2017 0900 Gross per 24 hour  Intake 1210 ml  Output 27250 ml  Net -26040 ml    LBM: Last BM Date: 09/17/17 Baseline Weight: Weight: 68 kg (150 lb) Most recent weight: Weight: 63 kg (139 lb)     Palliative Assessment/Data:   Flowsheet Rows     Most Recent Value  Intake Tab  Referral Department  Hospitalist  Unit at  Time of Referral  Med/Surg Unit  Palliative Care Primary Diagnosis  Cancer  Date Notified  09/30/17  Palliative Care Type  New Palliative care  Reason for referral  Clarify Goals of Care, Psychosocial or Spiritual support  Date of Admission  09/14/17  Date first seen by Palliative Care  10/01/17  # of days Palliative referral response time  1 Day(s)  # of days IP prior to Palliative referral  16  Clinical Assessment  Palliative Performance Scale Score  40%  Pain Max last 24 hours  7  Pain Min Last 24 hours  2  Psychosocial & Spiritual Assessment  Palliative Care Outcomes  Patient/Family meeting held?  Yes  Who was at the meeting?  Patient, husband, sister  Palliative Care Outcomes  Clarified goals of care, Provided psychosocial or spiritual support      Time Total: 75 Greater than 50%  of this time was spent counseling and coordinating care related to the above assessment and plan.  Signed by: Micheline Rough, MD   Please contact Palliative Medicine Team phone at [phone] for questions and concerns.  For individual provider: See Shea Evans

## 2017-10-03 DIAGNOSIS — R112 Nausea with vomiting, unspecified: Secondary | ICD-10-CM

## 2017-10-03 LAB — COMPREHENSIVE METABOLIC PANEL
ALT: 80 U/L — AB (ref 14–54)
AST: 72 U/L — AB (ref 15–41)
Albumin: 1.4 g/dL — ABNORMAL LOW (ref 3.5–5.0)
Alkaline Phosphatase: 298 U/L — ABNORMAL HIGH (ref 38–126)
Anion gap: 8 (ref 5–15)
BILIRUBIN TOTAL: 2.3 mg/dL — AB (ref 0.3–1.2)
BUN: 59 mg/dL — AB (ref 6–20)
CHLORIDE: 98 mmol/L — AB (ref 101–111)
CO2: 30 mmol/L (ref 22–32)
CREATININE: 1.75 mg/dL — AB (ref 0.44–1.00)
Calcium: 8.1 mg/dL — ABNORMAL LOW (ref 8.9–10.3)
GFR calc Af Amer: 35 mL/min — ABNORMAL LOW (ref >60)
GFR, EST NON AFRICAN AMERICAN: 30 mL/min — AB (ref >60)
Glucose, Bld: 129 mg/dL — ABNORMAL HIGH (ref 65–99)
POTASSIUM: 3.1 mmol/L — AB (ref 3.5–5.1)
Sodium: 136 mmol/L (ref 135–145)
Total Protein: 5.8 g/dL — ABNORMAL LOW (ref 6.5–8.1)

## 2017-10-03 LAB — MAGNESIUM: MAGNESIUM: 2.5 mg/dL — AB (ref 1.7–2.4)

## 2017-10-03 LAB — PHOSPHORUS: Phosphorus: 3.7 mg/dL (ref 2.5–4.6)

## 2017-10-03 MED ORDER — POTASSIUM CHLORIDE 10 MEQ/100ML IV SOLN
10.0000 meq | INTRAVENOUS | Status: AC
  Administered 2017-10-03 (×4): 10 meq via INTRAVENOUS
  Filled 2017-10-03 (×4): qty 100

## 2017-10-03 MED ORDER — TRACE MINERALS CR-CU-MN-SE-ZN 10-1000-500-60 MCG/ML IV SOLN
INTRAVENOUS | Status: AC
  Administered 2017-10-03: 17:00:00 via INTRAVENOUS
  Filled 2017-10-03: qty 960

## 2017-10-03 NOTE — Progress Notes (Signed)
Pt had a moderate amount of green/brown emesis this am. Zofran given.  On call surgery paged.  Order to place G-tube to low intermittent suction.  Pt resting comfortable, will continue to monitor closely.

## 2017-10-03 NOTE — Progress Notes (Signed)
Report received by ongoing nurse. Agreed with RN assessment of patient and will cont to monitor.  

## 2017-10-03 NOTE — Progress Notes (Signed)
[facility] Progress Note 10 Days Post-Op  Subjective: Patient in no distress, but vomited while G-tube clamped.  Placed on suction, LIS, but this does not seems to be effective.   Objective: Vital signs in last 24 hours: Temp:  [97.6 F (36.4 C)-98.3 F (36.8 C)] 98.3 F (36.8 C) (01/20 0508) Pulse Rate:  [95-98] 95 (01/20 0508) Resp:  [16-18] 16 (01/20 0508) BP: (124-127)/(81-89) 125/89 (01/20 0508) SpO2:  [92 %-100 %] 99 % (01/20 0508) Last BM Date: 09/17/17  Intake/Output from previous day: 01/19 0701 - 01/20 0700 In: 1414.7 [P.O.:840; I.V.:574.7] Out: 3725 [Urine:800; Drains:2925] Intake/Output this shift: Total I/O In: -  Out: 600 [Drains:600]  General: No distress.    Lungs: Clear  Abd: Hypoactive bowel sounds.  Not tender.    Extremities: No clinical signs or symptoms of DVT  Neuro: Intact  Lab Results:  @LABLAST2 (wbc:2,hgb:2,hct:2,plt:2) BMET ) Recent Labs    10/02/17 0439 10/03/17 0344  NA 136 136  K 3.1* 3.1*  CL 98* 98*  CO2 30 30  GLUCOSE 114* 129*  BUN 59* 59*  CREATININE 1.80* 1.75*  CALCIUM 8.0* 8.1*   PT/INR No results for input(s): LABPROT, INR in the last 72 hours. ABG No results for input(s): PHART, HCO3 in the last 72 hours.  Invalid input(s): PCO2, PO2  Studies/Results: US Renal  Result Date: 10/01/2017 CLINICAL DATA:  Acute renal injury.  History of ovarian carcinoma EXAM: RENAL ULTRASOUND COMPARISON:  CT abdomen and pelvis September 14, 2017 FINDINGS: Right Kidney: Length: 11.6 cm. Echogenicity is mildly increased. There is renal cortical thinning. No mass or perinephric fluid. Slight hydronephrosis visualized. There is a small extrarenal pelvis, seen on CT. No sonographically demonstrable calculus or ureterectasis. Left Kidney: Length: 11.8 cm. Echogenicity is mildly increased. There is renal cortical thinning. No mass or perinephric fluid visualized. There is moderate hydronephrosis on the left. No renal calculus or ureterectasis on  the left. Bladder: Postvoid with bladder nonvisualized. Other: Moderate ascites present. There is a right adrenal mass measuring 3.0 x 1.8 x 2.6 cm, also seen on recent CT. IMPRESSION: 1. Each kidney shows increased echogenicity and cortical thinning, findings indicative of medical renal disease. 2. Moderate hydronephrosis on the left. Slight hydronephrosis on the right. Obstructing focus on each side not seen. 3.  Urinary bladder decompressed and not visualized. 4.  Right adrenal mass, unchanged from recent CT examination. 5.  Moderate ascites. Electronically Signed   By: Lowella Grip III M.D.   On: 10/01/2017 11:22    Anti-infectives: Anti-infectives (From admission, onward)   Start     Dose/Rate Route Frequency Ordered Stop   09/25/17 1200  piperacillin-tazobactam (ZOSYN) IVPB 3.375 g  Status:  Discontinued     3.375 g 12.5 mL/hr over 240 Minutes Intravenous Every 8 hours 09/25/17 1054 09/30/17 0852   09/23/17 1249  cefoTEtan in Dextrose 5% (CEFOTAN) 2-2.08 GM-%(50ML) IVPB    Comments:  Christie Maynard   : cabinet override      09/23/17 1249 09/24/17 0059   09/23/17 1100  cefoTEtan (CEFOTAN) 2 g in dextrose 5 % 50 mL IVPB     2 g 100 mL/hr over 30 Minutes Intravenous  Once 09/22/17 1549 09/23/17 1333      Assessment/Plan: s/p Procedure(s): EXPLORATORY LAPAROTOMY WITH PALLIATIVE SMALL BOWEL TO RIGHT COLON BY PASS WITH PLACEMENT OF GASTROSTOMY TUBE Still obstructed.  Further workup may be necessary.    Palliative care is involved.  LOS: 18 days   Christie Maynard. Dahlia Bailiff, MD, FACS [phone] (  [facility] 10/03/2017

## 2017-10-03 NOTE — Progress Notes (Signed)
PROGRESS NOTE        PATIENT DETAILS Name: Christie Maynard Age: 62 y.o. Sex: female Date of Birth: 1956-03-04 Admit Date: 09/14/2017 Admitting Physician A Melven Sartorius., MD [id number], Baldemar Friday., PA-C  Brief Narrative: Patient is a 62 y.o. 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 She was subsequently diagnosed with small bowel obstruction, further evaluation with a colonoscopy revealed a cecal mass. She unfortunately appears to have metastatic colon cancer (and not ovarian cancer). She underwent a exploratory laparotomy with palliative small bowel to right colon bypass and placement of a gastrostomy tube on 1/10. Postoperatively, she she has developed a prolonged ileus, anasarca/volume overload and acute kidney injury.See below for further details.   Subjective:  Patient in bed, appears comfortable, denies any headache, no fever, no chest pain or pressure, no shortness of breath , no abdominal pain. No focal weakness.  She is still unable to pass flatus or have a bowel movement, this morning had an episode of emesis.  Assessment/Plan:   SBO (small bowel obstruction)  secondary to a cecal mass with now postop ileus: Underwent exploratory laparotomy, and palliative small bowel to right colon bypass with placement of a gastrostomy tube on 1/10.  She continues to have a ileus postoperatively- without any BM or flatus yet .  Became nauseated and had an episode of emesis early morning 10/03/2017, intra abdominal drainage tube hooked to intermittent suction and made n.p.o. Again.  Continue supportive care, monitor electrolytes, encourage activity and decrease narcotics, if all fails may try neostigmine.  For management of this problem to general surgery, palliative care also on board,  prognosis does not appear to good.  Metastatic colon cancer/peritoneal carcinomatosis: She was initially following with Dr. 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 Medical oncology (Dr. Benay Spice) following.  Acute kidney injury: Creatinine continues to worsen slightly every day-she remains volume overloaded-she did get Lasix a few days back but this has been on hold.  Given worsening renal function-we will go ahead and repeat a UA and a renal ultrasound.  L ureteral obstruction s/p stent placement: Due to worsening creatinine-we will go ahead and repeat a renal ultrasound.  Leukocytosis: I suspect this is mostly reactive-probably secondary to inflammation from peritoneal carcinomatosis-recent surgery. Do not see any foci of infection apparent-spoke with general surgery-okay to stop Zosyn at this time and monitor off antimicrobial therapy.  Anemia: Likely secondary to acute illness on top of chronic disease. Hemoglobin stable. Continue to follow periodically.  Anasarca/lower extremity edema: Secondary to hypoalbuminemia/critical illness-continues to have significant lower extremity edema.  See plans above to continue to hold diuretics for now.    Transaminitis: Remain stable-slightly increase in alkaline phosphatase-but that could be from TNA.  Seizure disorder: Continue IV Dilantin-with plans to transition to oral route when she is able to tolerate more by mouth intake.    DVT Prophylaxis: Prophylactic Lovenox   Code Status: Full code  Family Communication: Spouse at bedside  Disposition Plan: Remain inpatient  Antimicrobial  agents: Anti-infectives (From admission, onward)   Start     Dose/Rate Route Frequency Ordered Stop   09/25/17 1200  piperacillin-tazobactam (ZOSYN) IVPB 3.375 g  Status:  Discontinued     3.375 g 12.5 mL/hr over 240 Minutes Intravenous Every 8 hours 09/25/17 1054 09/30/17 0852   09/23/17 1249  cefoTEtan in Dextrose 5% (CEFOTAN) 2-2.08 GM-%(50ML) IVPB    Comments:  [name]   : cabinet override      09/23/17 1249 09/24/17 0059   09/23/17 1100  cefoTEtan (CEFOTAN) 2 g in dextrose 5 % 50 mL IVPB     2 g 100 mL/hr over 30 Minutes Intravenous  Once 09/22/17 1549 09/23/17 1333      Procedures:  CT-guided core biopsy of peritoneal tumor per Dr. Kathlene Cote 09/17/2017  Endoscopy per Dr. Cristina Gong 09/22/2016  Colonoscopy per Dr. Cristina Gong 09/22/2017  Exploratory laparotomy with palliative small bowel to right colon bypass with placement of gastrostomy tube per Dr. Marlou Starks 09/23/2017  CONSULTS:  Oncology   Gastroenterology   General surgery   GYN oncology  Palliative care   Time spent: 25- minutes-Greater than 50% of this time was spent in counseling, explanation of diagnosis, planning of further management, and coordination of care.  MEDICATIONS: Scheduled Meds: . enoxaparin (LOVENOX) injection  30 mg Subcutaneous Q24H  . feeding supplement  1 Container Oral TID BM  . phenytoin (DILANTIN) IV  100 mg Intravenous QHS   Continuous Infusions: . Marland KitchenTPN (CLINIMIX-E) Adult 40 mL/hr at 10/02/17 1715  . Marland KitchenTPN (CLINIMIX-E) Adult    . sodium chloride 10 mL/hr at 09/25/17 1214  . potassium chloride 10 mEq (10/03/17 0915)   PRN Meds:.morphine injection, morphine CONCENTRATE, ondansetron **OR** ondansetron (ZOFRAN) IV   PHYSICAL EXAM: Vital signs: Vitals:   10/02/17 0447 10/02/17 1449 10/02/17 2100 10/03/17 0508  BP: 116/72 127/81 124/83 125/89  Pulse: 94 98 96 95  Resp: 18 16 18 16   Temp: 99 F (37.2 C) 97.8 F (36.6 C) 97.6 F (36.4 C) 98.3 F (36.8 C)  TempSrc: Oral   Oral   SpO2: 97% 92% 100% 99%  Weight:      Height:       Filed Weights   09/14/17 1127 09/14/17 2110 09/22/17 1217  Weight: 68 kg (150 lb) 63.4 kg (139 lb 12.8 oz) 63 kg (139 lb)   Body mass index is 25.42 kg/m.   Exam  Awake Alert, Oriented X 3, No new F.N deficits, Normal affect [place].AT,PERRAL Supple Neck,No JVD, No cervical lymphadenopathy appriciated.  Symmetrical Chest wall movement, Good air movement bilaterally, CTAB RRR,No Gallops, Rubs or new Murmurs, No Parasternal Heave +ve B.Sounds, Abd is distended, midline incision staple site stable, abdominal drainage tube hooked to intermittent suction, No tenderness, No organomegaly appriciated, No rebound - guarding or rigidity. No Cyanosis, Clubbing or edema, No new Rash or bruise  I have personally reviewed following labs and imaging studies  LABORATORY DATA: CBC: Recent Labs  Lab 09/27/17 0449 09/28/17 0523 09/29/17 0303 10/02/17 0439  WBC 16.3* 16.2* 15.2* 9.0  NEUTROABS 13.4*  --   --   --   HGB 11.1* 10.4* 9.7* 9.1*  HCT 33.5* 30.5* 28.8* 27.7*  MCV 85.7 84.7 84.0 85.2  PLT 343 360 402* 481*    Basic Metabolic Panel: Recent Labs  Lab 09/29/17 0303 09/30/17 0705 10/01/17 0444 10/02/17 0439 10/03/17 0344  NA 136 137  137 137 136 136  K 4.1  3.7  3.7 3.4* 3.1* 3.1*  CL 99* 98*  99* 97* 98* 98*  CO2 27 29  27 29 30 30   GLUCOSE 101* 109*  107* 114* 114* 129*  BUN 57* 58*  58* 62* 59* 59*  CREATININE 1.45* 1.73*  1.70* 1.84* 1.80* 1.75*  CALCIUM 8.2* 8.0*  8.1* 8.0* 8.0* 8.1*  MG 2.5* 2.7* 2.6* 2.5* 2.5*  PHOS 5.9* 5.0* 4.3 3.6 3.7    GFR: Estimated Creatinine Clearance: 28.7 mL/min (A) (by C-G formula based on SCr of 1.75 mg/dL (H)).  Liver Function Tests: Recent Labs  Lab 09/29/17 0303 09/30/17 0705 10/01/17 0444 10/02/17 0439 10/03/17 0344  AST 63* 64* 54* 56* 72*  ALT 74* 72* 66* 66* 80*  ALKPHOS 186* 226* 246* 272* 298*  BILITOT 2.9* 2.3* 2.2* 2.2* 2.3*  PROT 5.5* 5.6* 5.9* 5.8* 5.8*   ALBUMIN 1.4* 1.4* 1.5* 1.4* 1.4*   No results for input(s): LIPASE, AMYLASE in the last 168 hours. No results for input(s): AMMONIA in the last 168 hours.  Coagulation Profile: No results for input(s): INR, PROTIME in the last 168 hours.  Cardiac Enzymes: No results for input(s): CKTOTAL, CKMB, CKMBINDEX, TROPONINI in the last 168 hours.  BNP (last 3 results) No results for input(s): PROBNP in the last 8760 hours.  HbA1C: No results for input(s): HGBA1C in the last 72 hours.  CBG: Recent Labs  Lab 09/26/17 1237 09/26/17 1727 09/26/17 2305 09/27/17 0639  GLUCAP 134* 134* 131* 137*    Lipid Profile: No results for input(s): CHOL, HDL, LDLCALC, TRIG, CHOLHDL, LDLDIRECT in the last 72 hours.  Thyroid Function Tests: No results for input(s): TSH, T4TOTAL, FREET4, T3FREE, THYROIDAB in the last 72 hours.  Anemia Panel: No results for input(s): VITAMINB12, FOLATE, FERRITIN, TIBC, IRON, RETICCTPCT in the last 72 hours.  Urine analysis:    Component Value Date/Time   COLORURINE AMBER (A) 10/02/2017 0008   APPEARANCEUR HAZY (A) 10/02/2017 0008   LABSPEC 1.014 10/02/2017 0008   PHURINE 5.0 10/02/2017 0008   GLUCOSEU NEGATIVE 10/02/2017 0008   HGBUR LARGE (A) 10/02/2017 0008   BILIRUBINUR NEGATIVE 10/02/2017 0008   KETONESUR NEGATIVE 10/02/2017 0008   PROTEINUR 30 (A) 10/02/2017 0008   NITRITE NEGATIVE 10/02/2017 0008   LEUKOCYTESUR TRACE (A) 10/02/2017 0008    Sepsis Labs: Lactic Acid, Venous    Component Value Date/Time   LATICACIDVEN 0.9 09/14/2017 2343    MICROBIOLOGY: Recent Results (from the past 240 hour(s))  Culture, Urine     Status: None   Collection Time: 09/25/17 11:30 AM  Result Value Ref Range Status   Specimen Description URINE, RANDOM  Final   Special Requests NONE  Final   Culture   Final    NO GROWTH Performed at [facility], [place] [address] [place], [place] [address]    Report Status 09/26/2017 FINAL  Final    RADIOLOGY  STUDIES/RESULTS: Dg Abd 1 View  Result Date: 09/15/2017 CLINICAL DATA:  Nasogastric tube placement.  Pelvic malignancy. EXAM: ABDOMEN - 1 VIEW COMPARISON:  Radiographs and CT 09/14/2017. FINDINGS: 1537 hours. Enteric tube projects below the diaphragm with tip in the left subphrenic region, likely in the gastric fundus. There is a double-J left ureteral stent. Mild bowel wall thickening is present in the left mid abdomen. No supine evidence of free intraperitoneal air. Right pelvic calcifications consistent with phleboliths are stable. IMPRESSION: Enteric tube projects over the left upper quadrant of the abdomen, likely in the gastric fundus. Electronically Signed  By: Richardean Sale M.D.   On: 09/15/2017 16:11   Ct Abdomen Pelvis W Contrast  Result Date: 09/14/2017 CLINICAL DATA:  Abdominal swelling and vomiting. Increasing weakness. Patient is recently been diagnosed with cervical cancer. Paracentesis 1 day ago and last week. EXAM: CT ABDOMEN AND PELVIS WITH CONTRAST TECHNIQUE: Multidetector CT imaging of the abdomen and pelvis was performed using the standard protocol following bolus administration of intravenous contrast. CONTRAST:  35mL ISOVUE-300 IOPAMIDOL (ISOVUE-300) INJECTION 61% COMPARISON:  09/03/2017 FINDINGS: Lower chest: Small bilateral pleural effusions, greater on the right. Mild basilar atelectasis is likely compressive. Moderate-sized esophageal hiatal hernia. Hepatobiliary: 1 cm diameter hypo dense lesion in segment 4 of the liver may represent a metastatic focus. No other focal liver lesions identified. Gallbladder and bile ducts are unremarkable. Pancreas: Pancreas is atrophic. No focal lesion or inflammatory changes appreciated. Spleen: Spleen size is normal.  No focal lesions. Adrenals/Urinary Tract: Right adrenal gland nodule measuring 2.2 cm in diameter. Hounsfield unit measurements on portal venous and delayed phase imaging demonstrate a relative washout of 55%. Relative washout of  40% or greater suggests benign adenoma. Renal nephrograms are homogeneous. Mildly dilated left intrarenal collecting system. A left ureteral stent is in place. Bladder is unremarkable. Stomach/Bowel: Stomach is not abnormally distended. There is diffuse dilatation of fluid-filled small bowel with decompressed terminal ileum. This is progressing since the previous study. Transition zone appears to be in the right lower quadrant. Appearance is consistent with small bowel obstruction. Cause of obstruction may be extrinsic compression of the small bowel, metastasis, or direct invasion. Colon is decompressed. Small amount of residual contrast material in the colon. Appendix is normal. Vascular/Lymphatic: No significant vascular findings are present. No enlarged abdominal or pelvic lymph nodes. Reproductive: Large heterogeneous pelvic mass extending above the uterus. This likely arises from the right ovary and appears to demonstrate direct invasion of the uterine fundus on the left. The mass measures about 6.7 by 10.2 x 10.7 cm. Other: Right lower quadrant mesenteric mass measuring 4.4 cm in diameter. Diffuse nodular infiltration throughout the omentum and mesenteric fat consistent with diffuse peritoneal metastasis. Small amount of free fluid throughout the abdomen and pelvis. No free air. Abdominal wall musculature appears intact. Musculoskeletal: Mild degenerative changes in the spine. Heterogeneous appearance of the proximal left femur with trabecular coarsening. Appearance is most consistent with Paget's disease although metastatic lesion could also have this appearance. IMPRESSION: 1. Large heterogeneous pelvic mass lesion consistent with malignancy, likely ovarian in origin. Probable direct invasion of the uterus. 2. Diffuse peritoneal carcinomatosis with nodular infiltration of the omentum and mesentery and diffuse peritoneal fluid. Right lower quadrant mass measuring 4.4 cm diameter, likely metastatic. 3.  Progressing small bowel obstruction with diffusely dilated fluid-filled small bowel. Transition zone is in the right lower quadrant. 4. Right adrenal gland nodule has characteristics suggesting a benign adenoma. 5. Bone changes in the proximal left femur probably represent Paget's disease although bone metastasis is not excluded. Consider bone scan for further evaluation if clinically indicated. 6. Left 3 atrial stent in place with mild residual hydronephrosis. 7. 1 cm low-attenuation lesion in the left lobe of the liver suspicious for metastasis. Electronically Signed   By: Lucienne Capers M.D.   On: 09/14/2017 19:29   Ct Abdomen Pelvis W Contrast  Result Date: 09/03/2017 CLINICAL DATA:  New onset ascites, epigastric pain, nausea, and vomiting for 3 days, abdominal distension swelling, recent UTI EXAM: CT ABDOMEN AND PELVIS WITH CONTRAST TECHNIQUE: Multidetector CT imaging of the abdomen  and pelvis was performed using the standard protocol following bolus administration of intravenous contrast. Sagittal and coronal MPR images reconstructed from axial data set. CONTRAST:  100 ML ISOVUE-300 IOPAMIDOL (ISOVUE-300) INJECTION 61% IV. No oral contrast given. COMPARISON:  None FINDINGS: Lower chest: Tiny BILATERAL pleural effusions larger on RIGHT. Subsegmental atelectasis at LEFT base. Hepatobiliary: Dependent density within gallbladder question calculi or sludge. Liver normal appearance. No biliary dilatation. Pancreas: Normal appearance Spleen: Normal appearance Adrenals/Urinary Tract: RIGHT adrenal mass 22 x 19 mm demonstrating significant washout on delayed images consistent with adenoma. LEFT adrenal gland unremarkable. LEFT hydronephrosis and hydroureter present. No RIGHT-side urinary tract dilatation. LEFT ureteral dilatation terminates at a mass in the pelvis see below. Inhomogeneous LEFT nephrogram question pyelonephritis. No renal mass lesions. Bladder decompressed. Stomach/Bowel: Appendix not localized.  Colon decompressed though displaced medially especially RIGHT by ascites. Moderate to large hiatal hernia. Decompressed proximal and minimally dilated distal small bowel loops suggesting mild degree of small bowel obstruction. Transition zone appears to be within the RIGHT pelvis at the level of the distal ileum. No definite bowel wall thickening Vascular/Lymphatic: Aorta normal caliber.  No definite adenopathy. Reproductive: Irregular heterogeneous nodular appearing soft tissue masses in the adnexa bilaterally with nodular areas of peripheral irregular enhancement compatible with ovarian neoplasm. It is uncertain whether this represents a single large multilobulated mass which extends across the midline in both adnexal regions or contiguous BILATERAL ovarian masses. The larger mass portion to the RIGHT of midline measures 10.6 x 4.9 x 9.8 cm and the smaller component to the LEFT of midline measures 8.2 x 4.9 x 4.8 cm. Tumor masses are contiguous with the uterus. Mildly complicated cystic lesion at the LEFT lateral aspect of the uterus 3.1 x 2.4 cm image 74 could be of uterine or ovarian origin. Other: Large amount of ascites. Fluid in lesser sac. Diffuse omental caking by tumor. No free air. No hernia. Musculoskeletal: No acute osseous findings. Prominent epidural vessels within the lumbar spinal canal. 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 Associated ascites and omental caking consistent with peritoneal carcinomatosis. Suspect mild distal small bowel obstruction. Distal LEFT ureteral obstruction with LEFT hydronephrosis and hydroureter. Patchy LEFT nephrogram most likely representing pyelonephritis recommend correlation with urinalysis.  Sludge versus dependent calculi within gallbladder. Large hiatal hernia. Tiny BILATERAL pleural effusions. Findings called to Dr. Broadus John on 09/03/2017 at 1741 hours. Electronically Signed   By: Lavonia Dana M.D.   On: 09/03/2017 17:48   US Renal  Result Date: 10/01/2017 CLINICAL DATA:  Acute renal injury.  History of ovarian carcinoma EXAM: RENAL ULTRASOUND COMPARISON:  CT abdomen and pelvis September 14, 2017 FINDINGS: Right Kidney: Length: 11.6 cm. Echogenicity is mildly increased. There is renal cortical thinning. No mass or perinephric fluid. Slight hydronephrosis visualized. There is a small extrarenal pelvis, seen on CT. No sonographically demonstrable calculus or ureterectasis. Left Kidney: Length: 11.8 cm. Echogenicity is mildly increased. There is renal cortical thinning. No mass or perinephric fluid visualized. There is moderate hydronephrosis on the left. No renal calculus or ureterectasis on the left. Bladder: Postvoid with bladder nonvisualized. Other: Moderate ascites present. There is a right adrenal mass measuring 3.0 x 1.8 x 2.6 cm, also seen on recent CT. IMPRESSION: 1. Each kidney shows increased  echogenicity and cortical thinning, findings indicative of medical renal disease. 2. Moderate hydronephrosis on the left. Slight hydronephrosis on the right. Obstructing focus on each side not seen. 3.  Urinary bladder decompressed and not visualized. 4.  Right adrenal mass, unchanged from recent CT examination. 5.  Moderate ascites. Electronically Signed   By: Lowella Grip III M.D.   On: 10/01/2017 11:22   US Paracentesis  Result Date: 09/04/2017 INDICATION: Pelvic mass, carcinomatosis and large volume ascites. EXAM: ULTRASOUND GUIDED PARACENTESIS MEDICATIONS: None. COMPLICATIONS: None immediate. PROCEDURE: Informed written consent was obtained from the patient after a discussion of the risks, benefits and alternatives to treatment. A timeout was performed prior to the initiation of the  procedure. Initial ultrasound was performed to localize ascites. The right lower abdomen was prepped and draped in the usual sterile fashion. 1% lidocaine was used for local anesthesia. Following this, a 6 Fr Safe-T-Centesis catheter was introduced. An ultrasound image was saved for documentation purposes. The paracentesis was performed. The catheter was removed and a dressing was applied. The patient tolerated the procedure well without immediate post procedural complication. FINDINGS: A total of approximately 4.8 L of amber, slightly blood tinged fluid was removed. Samples were sent to the laboratory as requested by the clinical team. IMPRESSION: Successful ultrasound-guided paracentesis yielding 4.8 liters of peritoneal fluid. Electronically Signed   By: Aletta Edouard M.D.   On: 09/04/2017 13:31   Ct Biopsy  Result Date: 09/17/2017 CLINICAL DATA:  Pelvic mass, diffuse peritoneal carcinomatosis and ascites. Prior cytology of peritoneal fluid was not conclusive for gynecologic malignancy and suggested potential GI origin. Request has now been made to perform biopsy of peritoneal tumor for more definitive diagnosis. EXAM: CT GUIDED CORE BIOPSY OF PERITONEAL MASS COMPARISON:  CT of the abdomen and pelvis on 09/14/2017 and 09/03/2017 ANESTHESIA/SEDATION: 2.0 mg IV Versed; 100 mcg IV Fentanyl Total Moderate Sedation Time:  20 minutes. The patient's level of consciousness and physiologic status were continuously monitored during the procedure by Radiology nursing. PROCEDURE: The procedure risks, benefits, and alternatives were explained to the patient. Questions regarding the procedure were encouraged and answered. The patient understands and consents to the procedure. The abdominal wall was prepped with chlorhexidine in a sterile fashion, and a sterile drape was applied covering the operative field. A sterile gown and sterile gloves were used for the procedure. Local anesthesia was provided with 1% Lidocaine. CT  was performed in a supine position. Under CT guidance, a 47 gauge trocar needle was advanced from an anterior oblique position into the left anterior peritoneal cavity. Core biopsy was performed with an 18 gauge core biopsy needle device. A total of 4 samples were obtained and submitted in formalin. Additional CT images were obtained after outer needle removal. COMPLICATIONS: None FINDINGS: Small bowel shows decompression since prior CT on 09/14/2017 after nasogastric decompression. This now allows for a safe window to approach of peritoneal tumor in the anterior peritoneal cavity/omental region. Tumor just deep to the left anterior abdominal wall was targeted and revealed solid tissue with biopsy. IMPRESSION: CT-guided core biopsy performed of peritoneal tumor. Electronically Signed   By: Aletta Edouard M.D.   On: 09/17/2017 16:26   Dg Chest Port 1 View  Result Date: 09/25/2017 CLINICAL DATA:  Leukocytosis and status post laparotomy and bowel bypass for colon carcinoma. EXAM: PORTABLE CHEST 1 VIEW COMPARISON:  08/30/2017 FINDINGS: Right arm PICC line extends to the distal SVC. The heart size and mediastinal contours are within normal limits. Lung volumes are low  with bibasilar atelectasis present. There may be small bilateral pleural effusions. There is no evidence of pulmonary edema, consolidation, pneumothorax or nodules. The visualized skeletal structures are unremarkable. IMPRESSION: Low lung volumes with bibasilar atelectasis. Possible small bilateral pleural effusions. Electronically Signed   By: Aletta Edouard M.D.   On: 09/25/2017 10:39   Dg Abd 2 Views  Result Date: 09/21/2017 CLINICAL DATA:  Follow-up small bowel obstruction. Clinically improved. EXAM: ABDOMEN - 2 VIEW COMPARISON:  Abdominal radiograph of January 2nd 2019 and CT scan of the abdomen of September 17, 2017. FINDINGS: There remain loops of mildly distended gas-filled small bowel to the right of midline and in the mid abdomen. There is a  gas within the transverse colon and a small amount of gas within the rectum. There is gas within a hiatal hernia. The esophagogastric tube tip and proximal port project below the hemidiaphragms. No free extraluminal gas collections are observed. A double pigtail stent is present in the left kidney and ureter extending into the bladder. IMPRESSION: Persistent partial distal small bowel obstruction. No evidence of perforation. Hiatal hernia.  The esophagogastric tube is in reasonable position. Electronically Signed   By: David  Martinique M.D.   On: 09/21/2017 13:12   Dg Abd Acute W/chest  Result Date: 09/14/2017 CLINICAL DATA:  Vomiting. EXAM: DG ABDOMEN ACUTE W/ 1V CHEST COMPARISON:  08/26/2017 FINDINGS: Left-sided nephroureteral stent is identified. Small bowel air-fluid levels are identified. Small bowel loops are mildly increased in caliber measuring up to 2.6 cm. There is no evidence of free intraperitoneal air. Heart size and mediastinal contours are within normal limits. There are bilateral pleural effusions noted right greater than left. Both lungs are clear. IMPRESSION: 1. Mildly increased caliber of small bowel loops with air-fluid levels. Cannot rule out low grade partial obstruction. 2. Status post left ureteral stenting. 3. Bilateral pleural effusions. Electronically Signed   By: Kerby Moors M.D.   On: 09/14/2017 16:58   Dg C-arm 1-60 Min-no Report  Result Date: 09/05/2017 Fluoroscopy was utilized by the requesting physician.  No radiographic interpretation.   US Abdomen Limited Ruq  Result Date: 09/03/2017 CLINICAL DATA:  Pancreatitis EXAM: ULTRASOUND ABDOMEN LIMITED RIGHT UPPER QUADRANT COMPARISON:  None. FINDINGS: Gallbladder: Sludge in the gallbladder. Normal wall thickness. Negative sonographic Murphy. Common bile duct: Diameter: 2.5 mm Liver: Parenchymal echogenicity within normal limits. On some of the images, nodular liver contour is suggested. Portal vein is patent on color Doppler  imaging with normal direction of blood flow towards the liver. The right pleural effusion is incidentally noted. There is moderate ascites in the right upper quadrant. IMPRESSION: 1. Sludge in the gallbladder. Negative for wall thickening, tenderness, or biliary dilatation 2. Right pleural effusion and moderate ascites in the right upper quadrant 3. On some of the images, liver contour appears slightly nodular, query hepatocellular disease. Electronically Signed   By: Donavan Foil M.D.   On: 09/03/2017 14:56   Ir Paracentesis  Result Date: 09/13/2017 INDICATION: Patient with recurrent ascites. Request is made for diagnostic and therapeutic paracentesis. EXAM: ULTRASOUND GUIDED DIAGNOSTIC AND THERAPEUTIC PARACENTESIS MEDICATIONS: 10 mL 2% lidocaine COMPLICATIONS: None immediate. PROCEDURE: Informed written consent was obtained from the patient after a discussion of the risks, benefits and alternatives to treatment. A timeout was performed prior to the initiation of the procedure. Initial ultrasound scanning demonstrates a large amount of ascites within the left lateral abdomen. The left lateral abdomen was prepped and draped in the usual sterile fashion. 2% lidocaine was used for  local anesthesia. Following this, a 19 gauge, 7-cm, Yueh catheter was introduced. An ultrasound image was saved for documentation purposes. The paracentesis was performed. The catheter was removed and a dressing was applied. The patient tolerated the procedure well without immediate post procedural complication. FINDINGS: A total of approximately 2.0 liters of red-colored fluid was removed. Samples were sent to the laboratory as requested by the clinical team. IMPRESSION: Successful ultrasound-guided paracentesis yielding 2.0 liters of peritoneal fluid. Read by:  Brynda Greathouse PA-C Electronically Signed   By: Jerilynn Mages.  Shick M.D.   On: 09/13/2017 15:21     LOS: 18 days   Signature  Lala Lund M.D on 10/03/2017 at 10:46  AM  Between 7am to 7pm - Pager - [phone] ( page via [email or web address], text pages only, please mention full 10 digit call back number).  After 7pm go to [email or web address] - password [facility]

## 2017-10-03 NOTE — Progress Notes (Signed)
PHARMACY - ADULT TOTAL PARENTERAL NUTRITION CONSULT NOTE & ANTIBIOTIC NOTE  Pharmacy Consult for TPN  Indication: bowel obstruction  Patient Measurements: Height: 5\' 2"  (157.5 cm) Weight: 139 lb (63 kg) IBW/kg (Calculated) : 50.1 TPN AdjBW (KG): 53.3 Body mass index is 25.42 kg/m. 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 CT abdomen/pelviswith contrast shows progressing SBO. Having BMs but with 1450 ml from NGT, Surgery recommends starting TPN. Patient with significant recent weight loss but primarily d/t multiple large-volume paracentesis. Poor intake since week before Xmas, and unable to keep anything down since 12/31. Appears to be at risk for refeeding.  Significant events:  1/9 endoscopy/colonoscopy showed mass in cecum 1/10:  Exp lap with palliative small bowel to right colon bypass with placement of gastrostomy 1/14 continue g tube to drain and allow to drink for comfort, tolerating clear liquids; lipids stopped 1/15 take lytes out of TPN, rate to 1/2 per CCS 2nd elevated LFTs.  Per [facility] notes, GI tract not functioning at this time. GI function may never return due to tumor burden, malignant SBO, with nothing left to do surgically. TPN is not indicated in this situation but has not been addressed with family. 1/19: still no return of bowel function, [facility] to try clamping G tube, still ongoing discussion of patient/family with palliative care  team   Recent Labs    10/02/17 0439 10/03/17 0344  NA 136 136  K 3.1* 3.1*  CL 98* 98*  CO2 30 30  GLUCOSE 114* 129*  BUN 59* 59*  CREATININE 1.80* 1.75*  CALCIUM 8.0* 8.1*  PHOS 3.6 3.7  MG 2.5* 2.5*  ALBUMIN 1.4* 1.4*  ALKPHOS 272* 298*  AST 56* 72*  ALT 66* 80*  BILITOT 2.2* 2.3*   Today, 10/03/2017:   Glucose (goal <150):  DC SSI/CBGs 1/14, blood gluc 114  Electrolytes: removed from TPN formula 1/15  K 3.1 (decreased) - orders per Md - 4 runs IV KCL  Phos 3.7 wnl   CorrCa 10.2 (CaxPhos product 38 - goal < 55)  Mg elevated at 2.5 but stable   Na 136 stable   Renal: SCr improved slightly to 1.75, UOP 871ml last 24 0.5 ml/kg/hr;  drains out 2925 ml  LFTs: slightly elevated on admit, AST/ALT and tbili elevated and increasing slightly, alkP 298 rising  TGs: elevated 180 (1/7) 465 (1/14) - not receiving lipids currently  Prealbumin: 8.4 (1.7) and 6.1 on 1/14  NUTRITIONAL GOALS  RD recs (09/20/17):  Kcal:  1700-1900 Protein:  80-90 grams Fluid:  >/= 1.7 L/d  Clinimix-E 5/15 at a goal rate of 75 ml/hr + 20% fat emulsion 240 ml / day to provide: 90 g/day protein, 1758 Kcal/day.  PLAN                                                                                 Today, at 1800 today:  Continue Clinimix E 5/15 at 40 ml/hr   20% fat emulsion was stopped Monday 1/14  2nd Trig > 400 (465 1/14)   This provides 682 kcal and 48 gm protein for 40% kcal and 60 % protein needs  TPN to contain standard multivitamins and trace elements  IVF per MD  TPN lab panels on Mondays & Thursdays  F/u daily  Per discussion with CCS, TPN to continue until goals of care established - meeting with family again per palliative on Monday   Adrian Saran, PharmD, BCPS Pager [phone] 10/03/2017 10:27 AM

## 2017-10-04 DIAGNOSIS — Z931 Gastrostomy status: Secondary | ICD-10-CM

## 2017-10-04 DIAGNOSIS — N1330 Unspecified hydronephrosis: Secondary | ICD-10-CM

## 2017-10-04 DIAGNOSIS — N19 Unspecified kidney failure: Secondary | ICD-10-CM

## 2017-10-04 LAB — COMPREHENSIVE METABOLIC PANEL
ALK PHOS: 270 U/L — AB (ref 38–126)
ALT: 70 U/L — ABNORMAL HIGH (ref 14–54)
ANION GAP: 8 (ref 5–15)
AST: 51 U/L — ABNORMAL HIGH (ref 15–41)
Albumin: 1.6 g/dL — ABNORMAL LOW (ref 3.5–5.0)
BILIRUBIN TOTAL: 1.8 mg/dL — AB (ref 0.3–1.2)
BUN: 55 mg/dL — ABNORMAL HIGH (ref 6–20)
CALCIUM: 8.2 mg/dL — AB (ref 8.9–10.3)
CO2: 30 mmol/L (ref 22–32)
Chloride: 98 mmol/L — ABNORMAL LOW (ref 101–111)
Creatinine, Ser: 1.68 mg/dL — ABNORMAL HIGH (ref 0.44–1.00)
GFR, EST AFRICAN AMERICAN: 36 mL/min — AB (ref >60)
GFR, EST NON AFRICAN AMERICAN: 31 mL/min — AB (ref >60)
Glucose, Bld: 124 mg/dL — ABNORMAL HIGH (ref 65–99)
Potassium: 3.5 mmol/L (ref 3.5–5.1)
SODIUM: 136 mmol/L (ref 135–145)
TOTAL PROTEIN: 6 g/dL — AB (ref 6.5–8.1)

## 2017-10-04 LAB — DIFFERENTIAL
BASOS ABS: 0 10*3/uL (ref 0.0–0.1)
BASOS PCT: 0 %
EOS ABS: 0 10*3/uL (ref 0.0–0.7)
Eosinophils Relative: 0 %
Lymphocytes Relative: 13 %
Lymphs Abs: 1.4 10*3/uL (ref 0.7–4.0)
Monocytes Absolute: 1 10*3/uL (ref 0.1–1.0)
Monocytes Relative: 9 %
NEUTROS ABS: 8.6 10*3/uL — AB (ref 1.7–7.7)
NEUTROS PCT: 78 %

## 2017-10-04 LAB — CBC
HCT: 30 % — ABNORMAL LOW (ref 36.0–46.0)
Hemoglobin: 9.9 g/dL — ABNORMAL LOW (ref 12.0–15.0)
MCH: 28.4 pg (ref 26.0–34.0)
MCHC: 33 g/dL (ref 30.0–36.0)
MCV: 86.2 fL (ref 78.0–100.0)
PLATELETS: 531 10*3/uL — AB (ref 150–400)
RBC: 3.48 MIL/uL — ABNORMAL LOW (ref 3.87–5.11)
RDW: 17.7 % — AB (ref 11.5–15.5)
WBC: 11 10*3/uL — AB (ref 4.0–10.5)

## 2017-10-04 LAB — PHOSPHORUS: PHOSPHORUS: 4 mg/dL (ref 2.5–4.6)

## 2017-10-04 LAB — PREALBUMIN: Prealbumin: 19.1 mg/dL (ref 18–38)

## 2017-10-04 LAB — TRIGLYCERIDES: TRIGLYCERIDES: 340 mg/dL — AB (ref <150)

## 2017-10-04 LAB — MAGNESIUM: MAGNESIUM: 2.6 mg/dL — AB (ref 1.7–2.4)

## 2017-10-04 MED ORDER — M.V.I. ADULT IV INJ
INJECTION | INTRAVENOUS | Status: AC
  Administered 2017-10-04: 18:00:00 via INTRAVENOUS
  Filled 2017-10-04: qty 960

## 2017-10-04 MED ORDER — SODIUM CHLORIDE 0.9% FLUSH
10.0000 mL | INTRAVENOUS | Status: DC | PRN
  Administered 2017-10-04 – 2017-10-05 (×2): 10 mL
  Filled 2017-10-04: qty 40

## 2017-10-04 MED ORDER — POTASSIUM CHLORIDE 10 MEQ/100ML IV SOLN
10.0000 meq | INTRAVENOUS | Status: AC
  Administered 2017-10-04 (×2): 10 meq via INTRAVENOUS
  Filled 2017-10-04 (×2): qty 100

## 2017-10-04 NOTE — Progress Notes (Signed)
IP PROGRESS NOTE  Subjective:   Ms. Hipp remains hospitalized following the palliative bypass procedure.  She continues to have obstructive symptoms.  She had a small bowel movement today.  The nursing staff reports Ms. Trapani has been unable to tolerate clamping of the gastrostomy tube.  She tried ice cream and liquids after clamping of the tube this afternoon and vomited. She denies pain.  She is ambulating. Objective: Vital signs in last 24 hours: Blood pressure 122/86, pulse 95, temperature 98 F (36.7 C), temperature source Oral, resp. rate 20, height _0  (1.575 m), weight 151 lb 9.6 oz (68.8 kg), SpO2 99 %.  Intake/Output from previous day: 01/20 0701 - 01/21 0700 In: 809.7 [P.O.:30; I.V.:739.7] Out: 1475 [Drains:1475]  Physical Exam:  HEENT: No thrush Lungs: Decreased breath sounds at the lower chest, no respiratory distress Cardiac: Regular rate and rhythm Abdomen: Distended, midline incision with staples in place, left upper quadrant gastrostomy tube site with a gauze dressing Vascular: 1+ pitting edema at the lower leg bilaterally     Lab Results: Recent Labs    10/02/17 0439 10/04/17 0348  WBC 9.0 11.0*  HGB 9.1* 9.9*  HCT 27.7* 30.0*  PLT 481* 531*    BMET Recent Labs    10/03/17 0344 10/04/17 0348  NA 136 136  K 3.1* 3.5  CL 98* 98*  CO2 30 30  GLUCOSE 129* 124*  BUN 59* 55*  CREATININE 1.75* 1.68*  CALCIUM 8.1* 8.2*    Lab Results  Component Value Date   CEA1 2,751.0 (H) 09/16/2017    Studies/Results: No results found.  Medications: I have reviewed the patient's current medications.  Assessment/Plan: 1. Metastatic colon cancer  Initial presentation with nausea and bloating.   CT abdomen/pelvis 09/03/2017 showed a multilobulated irregular heterogeneous enhancing soft tissue mass within the pelvis either involving both ovaries or a single mass arising from the right and extending across the midline with components measuring 10.6 x 4.9  x 9.8 cm and 8.2 x 4.9 x 4.8 cm.  There was associated ascites and omental caking consistent with peritoneal carcinomatosis.  A distal small bowel obstruction was suspected.  There was distal left ureteral obstruction with left hydronephrosis and hydroureter.    Ultrasound paracentesis was performed 09/04/2017.  Atypical cells were present, suspicious for adenocarcinoma of the GI tract.   Paracentesis 09/13/2017 with atypical cells present.  CT 09/14/2017 showed a progressing small bowel obstruction with diffusely dilated fluid-filled small bowel.  Transition zone in the right lower quadrant.  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  Right lower quadrant mass measuring 4.4 cm.    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  Nonspecific staining for PAX-8.  Negative for estrogen receptor, progesterone receptor and cytokeratin 7.  Colonoscopy 09/22/2017-cecum mass, biopsy confirmed adenocarcinoma with signet cell features  Exploratory laparotomy 09/23/2017- fixed pelvic and cecum masses, carcinomatosis with tumor implants at the abdominal wall, bowel, and omentum, status post a palliative small bowel to right colon bypass and placement of a gastrostomy tube 2. Small bowel obstruction secondary to carcinomatosis and a right cecum tumor, status post palliative small bowel to right colon bypass and placement of gastrostomy tube 09/23/2017 3. Distal left ureteral obstruction with left hydronephrosis and hydroureter status post left ureteral  stent placement 09/05/2017. 4. Seizure 1983. 5. Significant family history for breast cancer. 6. Renal failure-ultrasound 10/01/2017, mild right and moderate left  hydronephrosis   Ms. Valtierra has persistent obstructive symptoms following the palliative bypass procedure.  She has been unable to tolerate a diet with clamping of the gastrostomy tube to date.  I discussed the situation with Ms. Vicente and her husband.  It would be ideal to initiate systemic chemotherapy as an outpatient when she has fully recovered from surgery and is able to maintain her nutrition by mouth.  I think it is unlikely chemotherapy will alleviate a bowel obstruction. Treatment options include discharge to home with the gastrostomy tube and hospice care versus an inpatient course of chemotherapy.  I will discuss options further with Ms. Chaudhuri and the surgical/medical teams over the next few days.  I recommend FOLFOX if we decide to proceed with chemotherapy. Recommendations:  1.  Consider repeat abdominal x-ray to assess for obstruction 2.  Trial of Reglan if this has not been tried 3.  Follow-up Foundation 1 testing on the peritoneal biopsy from 09/17/2017 4.  Increase ambulation as tolerated 5.  Decision on inpatient chemotherapy versus home hospice if the bowel obstruction persists  Please call Oncology as needed.  I will check on her 09/27/2017.     LOS: 19 days   Betsy Coder, MD   10/04/2017, 4:09 PM

## 2017-10-04 NOTE — Progress Notes (Signed)
PROGRESS NOTE        PATIENT DETAILS Name: Christie Maynard Age: 62 y.o. Sex: female Date of Birth: [birth year] Admit Date: 09/14/2017 Admitting Physician A Melven Sartorius., MD [id number], Baldemar Friday., PA-C  Brief Narrative: Patient is a 62 y.o. 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 She was subsequently diagnosed with small bowel obstruction, further evaluation with a colonoscopy revealed a cecal mass. She unfortunately appears to have metastatic colon cancer (and not ovarian cancer). She underwent a exploratory laparotomy with palliative small bowel to right colon bypass and placement of a gastrostomy tube on 1/10. Postoperatively, she she has developed a prolonged ileus, anasarca/volume overload and acute kidney injury.See below for further details.   Subjective:  Patient in bed, appears comfortable, denies any headache, no fever, no chest pain or pressure, no shortness of breath , no abdominal pain. No focal weakness.  Nausea has minimally improved and had a small BM early morning 10/04/2017.  Assessment/Plan:   SBO (small bowel obstruction)  secondary to a cecal mass with now postop ileus: Underwent exploratory laparotomy, and palliative small bowel to right colon bypass with placement of a gastrostomy tube on 1/10.  She has developed severe ileus, being treated with supportive care with surgery following, eventually on 10/04/2017 small BM, continue supportive care, encouraged to walk and ambulate more, minimize narcotic use monitor electrolytes, for now her G-tube is down with gravity draining gastric contents, defer management of this problem to general surgery.  Metastatic colon cancer/peritoneal carcinomatosis: She was initially following with Dr. 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 Medical oncology (Dr. Benay Spice) following.  Acute kidney injury: Creatinine continues to worsen slightly every day-she remains volume overloaded-she did get Lasix a few days back but this has been on hold.  Given worsening renal function-we will go ahead and repeat a UA and a renal ultrasound.  L ureteral obstruction s/p stent placement: Due to worsening creatinine-we will go ahead and repeat a renal ultrasound.  Leukocytosis: I suspect this is mostly reactive-probably secondary to inflammation from peritoneal carcinomatosis-recent surgery. Do not see any foci of infection apparent-spoke with general surgery-okay to stop Zosyn at this time and monitor off antimicrobial therapy.  Anemia: Likely secondary to acute illness on top of chronic disease. Hemoglobin stable. Continue to follow periodically.  Anasarca/lower extremity edema: Secondary to hypoalbuminemia/critical illness-continues to have significant lower extremity edema.  See plans above to continue to hold diuretics for now.    Transaminitis: Remain stable-slightly increase in alkaline phosphatase-but that could be from TNA.  Seizure disorder: Continue IV Dilantin-with plans to transition to oral route when she is able to tolerate more by mouth intake.    DVT Prophylaxis: Prophylactic Lovenox   Code Status: Full code   Family Communication: Husband bedside 10/04/2017 with  Disposition Plan: Remain inpatient  Antimicrobial agents: Anti-infectives (From admission, onward)   Start     Dose/Rate Route Frequency Ordered Stop  09/25/17 1200  piperacillin-tazobactam (ZOSYN) IVPB  3.375 g  Status:  Discontinued     3.375 g 12.5 mL/hr over 240 Minutes Intravenous Every 8 hours 09/25/17 1054 09/30/17 0852   09/23/17 1249  cefoTEtan in Dextrose 5% (CEFOTAN) 2-2.08 GM-%(50ML) IVPB    Comments:  [name]   : cabinet override      09/23/17 1249 09/24/17 0059   09/23/17 1100  cefoTEtan (CEFOTAN) 2 g in dextrose 5 % 50 mL IVPB     2 g 100 mL/hr over 30 Minutes Intravenous  Once 09/22/17 1549 09/23/17 1333      Procedures:  CT-guided core biopsy of peritoneal tumor per Dr. Kathlene Cote 09/17/2017  Endoscopy per Dr. Cristina Gong 09/22/2016  Colonoscopy per Dr. Cristina Gong 09/22/2017  Exploratory laparotomy with palliative small bowel to right colon bypass with placement of gastrostomy tube per Dr. Marlou Starks 09/23/2017  CONSULTS:  Oncology   Gastroenterology   General surgery   GYN oncology  Palliative care   Time spent: 25- minutes-Greater than 50% of this time was spent in counseling, explanation of diagnosis, planning of further management, and coordination of care.  MEDICATIONS: Scheduled Meds: . enoxaparin (LOVENOX) injection  30 mg Subcutaneous Q24H  . feeding supplement  1 Container Oral TID BM  . phenytoin (DILANTIN) IV  100 mg Intravenous QHS   Continuous Infusions: . Marland KitchenTPN (CLINIMIX-E) Adult 40 mL/hr at 10/03/17 1725  . sodium chloride 10 mL/hr at 09/25/17 1214  . potassium chloride     PRN Meds:.morphine injection, morphine CONCENTRATE, ondansetron **OR** ondansetron (ZOFRAN) IV   PHYSICAL EXAM: Vital signs: Vitals:   10/03/17 1014 10/03/17 1328 10/03/17 2041 10/04/17 0512  BP:  100/75 116/74 128/84  Pulse:  94 97 100  Resp:  16 18 20   Temp:  97.6 F (36.4 C) 97.6 F (36.4 C) 98.2 F (36.8 C)  TempSrc:  Oral Axillary Oral  SpO2:  100% 100% 99%  Weight: 68.8 kg (151 lb 9.6 oz)     Height:       Filed Weights   09/14/17 2110 09/22/17 1217 10/03/17 1014  Weight: 63.4 kg (139 lb 12.8 oz) 63 kg (139 lb) 68.8 kg (151 lb 9.6 oz)   Body mass index  is 27.73 kg/m.   Exam  Awake alert, no focal deficits [place].AT,PERRAL Supple Neck,No JVD, No cervical lymphadenopathy appriciated.  Symmetrical Chest wall movement, Good air movement bilaterally, CTAB RRR,No Gallops, Rubs or new Murmurs, No Parasternal Heave +ve B.Sounds, Abd is distended, midline incision staple site stable, abdominal drainage tube hooked to intermittent suction, No tenderness, No organomegaly appriciated, No rebound - guarding or rigidity. No cyanosis clubbing or edema, no new rash or skin  I have personally reviewed following labs and imaging studies  LABORATORY DATA: CBC: Recent Labs  Lab 09/28/17 0523 09/29/17 0303 10/02/17 0439 10/04/17 0348  WBC 16.2* 15.2* 9.0 11.0*  NEUTROABS  --   --   --  8.6*  HGB 10.4* 9.7* 9.1* 9.9*  HCT 30.5* 28.8* 27.7* 30.0*  MCV 84.7 84.0 85.2 86.2  PLT 360 402* 481* 531*    Basic Metabolic Panel: Recent Labs  Lab 09/30/17 0705 10/01/17 0444 10/02/17 0439 10/03/17 0344 10/04/17 0348  NA 137  137 137 136 136 136  K 3.7  3.7 3.4* 3.1* 3.1* 3.5  CL 98*  99* 97* 98* 98* 98*  CO2 29  27 29 30 30 30   GLUCOSE 109*  107* 114* 114* 129* 124*  BUN 58*  58* 62* 59* 59* 55*  CREATININE 1.73*  1.70* 1.84* 1.80* 1.75* 1.68*  CALCIUM 8.0*  8.1* 8.0* 8.0* 8.1* 8.2*  MG 2.7* 2.6* 2.5* 2.5* 2.6*  PHOS 5.0* 4.3 3.6 3.7 4.0    GFR: Estimated Creatinine Clearance: 31.2 mL/min (A) (by C-G formula based on SCr of 1.68 mg/dL (H)).  Liver Function Tests: Recent Labs  Lab 09/30/17 0705 10/01/17 0444 10/02/17 0439 10/03/17 0344 10/04/17 0348  AST 64* 54* 56* 72* 51*  ALT 72* 66* 66* 80* 70*  ALKPHOS 226* 246* 272* 298* 270*  BILITOT 2.3* 2.2* 2.2* 2.3* 1.8*  PROT 5.6* 5.9* 5.8* 5.8* 6.0*  ALBUMIN 1.4* 1.5* 1.4* 1.4* 1.6*   No results for input(s): LIPASE, AMYLASE in the last 168 hours. No results for input(s): AMMONIA in the last 168 hours.  Coagulation Profile: No results for input(s): INR, PROTIME in the last 168  hours.  Cardiac Enzymes: No results for input(s): CKTOTAL, CKMB, CKMBINDEX, TROPONINI in the last 168 hours.  BNP (last 3 results) No results for input(s): PROBNP in the last 8760 hours.  HbA1C: No results for input(s): HGBA1C in the last 72 hours.  CBG: No results for input(s): GLUCAP in the last 168 hours.  Lipid Profile: Recent Labs    10/04/17 0348  TRIG 340*    Thyroid Function Tests: No results for input(s): TSH, T4TOTAL, FREET4, T3FREE, THYROIDAB in the last 72 hours.  Anemia Panel: No results for input(s): VITAMINB12, FOLATE, FERRITIN, TIBC, IRON, RETICCTPCT in the last 72 hours.  Urine analysis:    Component Value Date/Time   COLORURINE AMBER (A) 10/02/2017 0008   APPEARANCEUR HAZY (A) 10/02/2017 0008   LABSPEC 1.014 10/02/2017 0008   PHURINE 5.0 10/02/2017 0008   GLUCOSEU NEGATIVE 10/02/2017 0008   HGBUR LARGE (A) 10/02/2017 0008   BILIRUBINUR NEGATIVE 10/02/2017 0008   KETONESUR NEGATIVE 10/02/2017 0008   PROTEINUR 30 (A) 10/02/2017 0008   NITRITE NEGATIVE 10/02/2017 0008   LEUKOCYTESUR TRACE (A) 10/02/2017 0008    Sepsis Labs: Lactic Acid, Venous    Component Value Date/Time   LATICACIDVEN 0.9 09/14/2017 2343    MICROBIOLOGY: Recent Results (from the past 240 hour(s))  Culture, Urine     Status: None   Collection Time: 09/25/17 11:30 AM  Result Value Ref Range Status   Specimen Description URINE, RANDOM  Final   Special Requests NONE  Final   Culture   Final    NO GROWTH Performed at [facility], [place] [address] [place], [place] [address]    Report Status 09/26/2017 FINAL  Final    RADIOLOGY STUDIES/RESULTS: Dg Abd 1 View  Result Date: 09/15/2017 CLINICAL DATA:  Nasogastric tube placement.  Pelvic malignancy. EXAM: ABDOMEN - 1 VIEW COMPARISON:  Radiographs and CT 09/14/2017. FINDINGS: 1537 hours. Enteric tube projects below the diaphragm with tip in the left subphrenic region, likely in the gastric fundus. There is a double-J left  ureteral stent. Mild bowel wall thickening is present in the left mid abdomen. No supine evidence of free intraperitoneal air. Right pelvic calcifications consistent with phleboliths are stable. IMPRESSION: Enteric tube projects over the left upper quadrant of the abdomen, likely in the gastric fundus. Electronically Signed   By: Richardean Sale M.D.   On: 09/15/2017 16:11   Ct Abdomen Pelvis W Contrast  Result Date: 09/14/2017 CLINICAL DATA:  Abdominal swelling and vomiting. Increasing weakness. Patient is recently been diagnosed with cervical cancer. Paracentesis 1 day ago and last week. EXAM: CT ABDOMEN AND PELVIS WITH CONTRAST TECHNIQUE: Multidetector CT  imaging of the abdomen and pelvis was performed using the standard protocol following bolus administration of intravenous contrast. CONTRAST:  25mL ISOVUE-300 IOPAMIDOL (ISOVUE-300) INJECTION 61% COMPARISON:  09/03/2017 FINDINGS: Lower chest: Small bilateral pleural effusions, greater on the right. Mild basilar atelectasis is likely compressive. Moderate-sized esophageal hiatal hernia. Hepatobiliary: 1 cm diameter hypo dense lesion in segment 4 of the liver may represent a metastatic focus. No other focal liver lesions identified. Gallbladder and bile ducts are unremarkable. Pancreas: Pancreas is atrophic. No focal lesion or inflammatory changes appreciated. Spleen: Spleen size is normal.  No focal lesions. Adrenals/Urinary Tract: Right adrenal gland nodule measuring 2.2 cm in diameter. Hounsfield unit measurements on portal venous and delayed phase imaging demonstrate a relative washout of 55%. Relative washout of 40% or greater suggests benign adenoma. Renal nephrograms are homogeneous. Mildly dilated left intrarenal collecting system. A left ureteral stent is in place. Bladder is unremarkable. Stomach/Bowel: Stomach is not abnormally distended. There is diffuse dilatation of fluid-filled small bowel with decompressed terminal ileum. This is progressing  since the previous study. Transition zone appears to be in the right lower quadrant. Appearance is consistent with small bowel obstruction. Cause of obstruction may be extrinsic compression of the small bowel, metastasis, or direct invasion. Colon is decompressed. Small amount of residual contrast material in the colon. Appendix is normal. Vascular/Lymphatic: No significant vascular findings are present. No enlarged abdominal or pelvic lymph nodes. Reproductive: Large heterogeneous pelvic mass extending above the uterus. This likely arises from the right ovary and appears to demonstrate direct invasion of the uterine fundus on the left. The mass measures about 6.7 by 10.2 x 10.7 cm. Other: Right lower quadrant mesenteric mass measuring 4.4 cm in diameter. Diffuse nodular infiltration throughout the omentum and mesenteric fat consistent with diffuse peritoneal metastasis. Small amount of free fluid throughout the abdomen and pelvis. No free air. Abdominal wall musculature appears intact. Musculoskeletal: Mild degenerative changes in the spine. Heterogeneous appearance of the proximal left femur with trabecular coarsening. Appearance is most consistent with Paget's disease although metastatic lesion could also have this appearance. IMPRESSION: 1. Large heterogeneous pelvic mass lesion consistent with malignancy, likely ovarian in origin. Probable direct invasion of the uterus. 2. Diffuse peritoneal carcinomatosis with nodular infiltration of the omentum and mesentery and diffuse peritoneal fluid. Right lower quadrant mass measuring 4.4 cm diameter, likely metastatic. 3. Progressing small bowel obstruction with diffusely dilated fluid-filled small bowel. Transition zone is in the right lower quadrant. 4. Right adrenal gland nodule has characteristics suggesting a benign adenoma. 5. Bone changes in the proximal left femur probably represent Paget's disease although bone metastasis is not excluded. Consider bone scan for  further evaluation if clinically indicated. 6. Left 3 atrial stent in place with mild residual hydronephrosis. 7. 1 cm low-attenuation lesion in the left lobe of the liver suspicious for metastasis. Electronically Signed   By: Lucienne Capers M.D.   On: 09/14/2017 19:29   US Renal  Result Date: 10/01/2017 CLINICAL DATA:  Acute renal injury.  History of ovarian carcinoma EXAM: RENAL ULTRASOUND COMPARISON:  CT abdomen and pelvis September 14, 2017 FINDINGS: Right Kidney: Length: 11.6 cm. Echogenicity is mildly increased. There is renal cortical thinning. No mass or perinephric fluid. Slight hydronephrosis visualized. There is a small extrarenal pelvis, seen on CT. No sonographically demonstrable calculus or ureterectasis. Left Kidney: Length: 11.8 cm. Echogenicity is mildly increased. There is renal cortical thinning. No mass or perinephric fluid visualized. There is moderate hydronephrosis on the left. No renal calculus  or ureterectasis on the left. Bladder: Postvoid with bladder nonvisualized. Other: Moderate ascites present. There is a right adrenal mass measuring 3.0 x 1.8 x 2.6 cm, also seen on recent CT. IMPRESSION: 1. Each kidney shows increased echogenicity and cortical thinning, findings indicative of medical renal disease. 2. Moderate hydronephrosis on the left. Slight hydronephrosis on the right. Obstructing focus on each side not seen. 3.  Urinary bladder decompressed and not visualized. 4.  Right adrenal mass, unchanged from recent CT examination. 5.  Moderate ascites. Electronically Signed   By: Lowella Grip III M.D.   On: 10/01/2017 11:22   US Paracentesis  Result Date: 09/04/2017 INDICATION: Pelvic mass, carcinomatosis and large volume ascites. EXAM: ULTRASOUND GUIDED PARACENTESIS MEDICATIONS: None. COMPLICATIONS: None immediate. PROCEDURE: Informed written consent was obtained from the patient after a discussion of the risks, benefits and alternatives to treatment. A timeout was performed  prior to the initiation of the procedure. Initial ultrasound was performed to localize ascites. The right lower abdomen was prepped and draped in the usual sterile fashion. 1% lidocaine was used for local anesthesia. Following this, a 6 Fr Safe-T-Centesis catheter was introduced. An ultrasound image was saved for documentation purposes. The paracentesis was performed. The catheter was removed and a dressing was applied. The patient tolerated the procedure well without immediate post procedural complication. FINDINGS: A total of approximately 4.8 L of amber, slightly blood tinged fluid was removed. Samples were sent to the laboratory as requested by the clinical team. IMPRESSION: Successful ultrasound-guided paracentesis yielding 4.8 liters of peritoneal fluid. Electronically Signed   By: Aletta Edouard M.D.   On: 09/04/2017 13:31   Ct Biopsy  Result Date: 09/17/2017 CLINICAL DATA:  Pelvic mass, diffuse peritoneal carcinomatosis and ascites. Prior cytology of peritoneal fluid was not conclusive for gynecologic malignancy and suggested potential GI origin. Request has now been made to perform biopsy of peritoneal tumor for more definitive diagnosis. EXAM: CT GUIDED CORE BIOPSY OF PERITONEAL MASS COMPARISON:  CT of the abdomen and pelvis on 09/14/2017 and 09/03/2017 ANESTHESIA/SEDATION: 2.0 mg IV Versed; 100 mcg IV Fentanyl Total Moderate Sedation Time:  20 minutes. The patient's level of consciousness and physiologic status were continuously monitored during the procedure by Radiology nursing. PROCEDURE: The procedure risks, benefits, and alternatives were explained to the patient. Questions regarding the procedure were encouraged and answered. The patient understands and consents to the procedure. The abdominal wall was prepped with chlorhexidine in a sterile fashion, and a sterile drape was applied covering the operative field. A sterile gown and sterile gloves were used for the procedure. Local anesthesia was  provided with 1% Lidocaine. CT was performed in a supine position. Under CT guidance, a 58 gauge trocar needle was advanced from an anterior oblique position into the left anterior peritoneal cavity. Core biopsy was performed with an 18 gauge core biopsy needle device. A total of 4 samples were obtained and submitted in formalin. Additional CT images were obtained after outer needle removal. COMPLICATIONS: None FINDINGS: Small bowel shows decompression since prior CT on 09/14/2017 after nasogastric decompression. This now allows for a safe window to approach of peritoneal tumor in the anterior peritoneal cavity/omental region. Tumor just deep to the left anterior abdominal wall was targeted and revealed solid tissue with biopsy. IMPRESSION: CT-guided core biopsy performed of peritoneal tumor. Electronically Signed   By: Aletta Edouard M.D.   On: 09/17/2017 16:26   Dg Chest Port 1 View  Result Date: 09/25/2017 CLINICAL DATA:  Leukocytosis and status post laparotomy  and bowel bypass for colon carcinoma. EXAM: PORTABLE CHEST 1 VIEW COMPARISON:  08/30/2017 FINDINGS: Right arm PICC line extends to the distal SVC. The heart size and mediastinal contours are within normal limits. Lung volumes are low with bibasilar atelectasis present. There may be small bilateral pleural effusions. There is no evidence of pulmonary edema, consolidation, pneumothorax or nodules. The visualized skeletal structures are unremarkable. IMPRESSION: Low lung volumes with bibasilar atelectasis. Possible small bilateral pleural effusions. Electronically Signed   By: Aletta Edouard M.D.   On: 09/25/2017 10:39   Dg Abd 2 Views  Result Date: 09/21/2017 CLINICAL DATA:  Follow-up small bowel obstruction. Clinically improved. EXAM: ABDOMEN - 2 VIEW COMPARISON:  Abdominal radiograph of January 2nd 2019 and CT scan of the abdomen of September 17, 2017. FINDINGS: There remain loops of mildly distended gas-filled small bowel to the right of midline and  in the mid abdomen. There is a gas within the transverse colon and a small amount of gas within the rectum. There is gas within a hiatal hernia. The esophagogastric tube tip and proximal port project below the hemidiaphragms. No free extraluminal gas collections are observed. A double pigtail stent is present in the left kidney and ureter extending into the bladder. IMPRESSION: Persistent partial distal small bowel obstruction. No evidence of perforation. Hiatal hernia.  The esophagogastric tube is in reasonable position. Electronically Signed   By: David  Martinique M.D.   On: 09/21/2017 13:12   Dg Abd Acute W/chest  Result Date: 09/14/2017 CLINICAL DATA:  Vomiting. EXAM: DG ABDOMEN ACUTE W/ 1V CHEST COMPARISON:  08/26/2017 FINDINGS: Left-sided nephroureteral stent is identified. Small bowel air-fluid levels are identified. Small bowel loops are mildly increased in caliber measuring up to 2.6 cm. There is no evidence of free intraperitoneal air. Heart size and mediastinal contours are within normal limits. There are bilateral pleural effusions noted right greater than left. Both lungs are clear. IMPRESSION: 1. Mildly increased caliber of small bowel loops with air-fluid levels. Cannot rule out low grade partial obstruction. 2. Status post left ureteral stenting. 3. Bilateral pleural effusions. Electronically Signed   By: Kerby Moors M.D.   On: 09/14/2017 16:58   Dg C-arm 1-60 Min-no Report  Result Date: 09/05/2017 Fluoroscopy was utilized by the requesting physician.  No radiographic interpretation.   Ir Paracentesis  Result Date: 09/13/2017 INDICATION: Patient with recurrent ascites. Request is made for diagnostic and therapeutic paracentesis. EXAM: ULTRASOUND GUIDED DIAGNOSTIC AND THERAPEUTIC PARACENTESIS MEDICATIONS: 10 mL 2% lidocaine COMPLICATIONS: None immediate. PROCEDURE: Informed written consent was obtained from the patient after a discussion of the risks, benefits and alternatives to  treatment. A timeout was performed prior to the initiation of the procedure. Initial ultrasound scanning demonstrates a large amount of ascites within the left lateral abdomen. The left lateral abdomen was prepped and draped in the usual sterile fashion. 2% lidocaine was used for local anesthesia. Following this, a 19 gauge, 7-cm, Yueh catheter was introduced. An ultrasound image was saved for documentation purposes. The paracentesis was performed. The catheter was removed and a dressing was applied. The patient tolerated the procedure well without immediate post procedural complication. FINDINGS: A total of approximately 2.0 liters of red-colored fluid was removed. Samples were sent to the laboratory as requested by the clinical team. IMPRESSION: Successful ultrasound-guided paracentesis yielding 2.0 liters of peritoneal fluid. Read by:  Brynda Greathouse PA-C Electronically Signed   By: Jerilynn Mages.  Shick M.D.   On: 09/13/2017 15:21     LOS: 19 days  Signature  Lala Lund M.D on 10/04/2017 at 12:13 PM  Between 7am to 7pm - Pager - [phone] ( page via [email or web address], text pages only, please mention full 10 digit call back number).  After 7pm go to [email or web address] - password [email or web address]

## 2017-10-04 NOTE — Progress Notes (Signed)
GT clamped for dinner and will cont to monitor for nausea and discomfort. SRP, RN

## 2017-10-04 NOTE — Progress Notes (Signed)
Palliative care progress note  Reason for consult: Goals of care in light of metastatic GI disease with peritoneal carcinomatosis status post surgery with prolonged ileus  I met today with Christie Maynard.  No other family is at the bedside.  Reports small BM and gas this AM.  She continues to be discouraged by her overall progress and getting nauseous with clamping of tube.  We talked again about the incurable nature of her illness and the goal of all of her care team to be to figure out the best way to add the most quality and time to her life as possible.  She will continue to work with surgery and oncology to develop the best plan possible moving forward with hope that she will improve enough to be a candidate for chemotherapy.  We also discussed again about her goal of being well enough to return to her home and spend time with her family.  We discussed my concern that aggressive interventions in the event of her worsening and suffering from cardiac or respiratory arrest are not likely to get her well enough to return to her home in a meaningful way.  She reports understanding this, however, she states that she wants to continue with current care plan for the next several days while we are waiting to see if she has any return of bowel function.  She is agreeable to continuing to discuss this moving forward based upon her clinical course.  I am going off service, however, I will ask another member of the palliative medicine team to follow-up with her in the next couple of days.  Continue to monitor and progress conversations based upon her clinical course.  Please call if there are specific areas with which we can be of assistance moving forward.  Total time: 30 minutes Greater than 50%  of this time was spent counseling and coordinating care related to the above assessment and plan.  Micheline Rough, MD [facility] [phone]

## 2017-10-04 NOTE — Progress Notes (Signed)
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 Patient reports poor oral intake and unable to eat solid foods for past few weeks but 24 hours prior to this admission, she felt a lot worse, bloated, uncomfortable. /p EXPLORATORY LAPAROTOMY WITH PALLIATIVE SMALL BOWEL TO RIGHT COLON BY PASS WITH PLACEMENT OF GASTROSTOMY TUBE 1/10 Dr    PT Comments    Pt ambulated 190' with RW, no loss of balance. Reviewed log roll technique for supine to sit, no physical assist needed. Overall, pt is progressing with mobility.   Follow Up Recommendations  No PT follow up     Equipment Recommendations  Rolling walker with 5" wheels    Recommendations for Other Services       Precautions / Restrictions Precautions Precautions: Fall Restrictions Weight Bearing Restrictions: No    Mobility  Bed Mobility   Bed Mobility: Rolling Rolling: Supervision Sidelying to sit: Supervision;HOB elevated       General bed mobility comments: VCs for log roll technique, HOB up 20*  Transfers Overall transfer level: Needs assistance Equipment used: Rolling walker (2 wheeled) Transfers: Sit to/from Stand Sit to Stand: Supervision         General transfer comment: VCs hand placement  Ambulation/Gait   Ambulation Distance (Feet): 190 Feet Assistive device: Rolling walker (2 wheeled) Gait Pattern/deviations: Step-through pattern   Gait velocity interpretation: Below normal speed for age/gender General Gait Details: slow but steady pace, no LOB, distance limited by fatigue   Stairs            Wheelchair Mobility    Modified  Rankin (Stroke Patients Only)       Balance Overall balance assessment: Modified Independent                                          Cognition Arousal/Alertness: Awake/alert Behavior During Therapy: WFL for tasks assessed/performed Overall Cognitive Status: Within Functional Limits for tasks assessed                                        Exercises      General Comments        Pertinent Vitals/Pain Pain Assessment: No/denies pain    Home Living                      Prior Function            PT Goals (current goals can now be found in the care plan section) Acute Rehab PT Goals Patient Stated Goal: take care of 4 y.o. grandaughter PT Goal Formulation: With patient Time For Goal Achievement: 10/12/17 Potential to Achieve Goals: Good Progress towards PT goals: Progressing toward goals    Frequency    Min 3X/week      PT Plan Current plan remains appropriate    Co-evaluation              AM-PAC PT "6 Clicks" Daily Activity  Outcome Measure  Difficulty turning over in bed (  including adjusting bedclothes, sheets and blankets)?: A Little Difficulty moving from lying on back to sitting on the side of the bed? : A Little Difficulty sitting down on and standing up from a chair with arms (e.g., wheelchair, bedside commode, etc,.)?: A Little Help needed moving to and from a bed to chair (including a wheelchair)?: A Little Help needed walking in hospital room?: A Little Help needed climbing 3-5 steps with a railing? : A Lot 6 Click Score: 17    End of Session Equipment Utilized During Treatment: Gait belt Activity Tolerance: Patient tolerated treatment well Patient left: with call bell/phone within reach;in chair Nurse Communication: Mobility status PT Visit Diagnosis: Difficulty in walking, not elsewhere classified (R26.2)     Time: 1247-1300 PT Time Calculation (min) (ACUTE ONLY): 13 min  Charges:  $Gait  Training: 8-22 mins                    G Codes:          Blondell Reveal Kistler 10/04/2017, 1:06 PM [phone]

## 2017-10-04 NOTE — Progress Notes (Signed)
Nutrition Follow-up  DOCUMENTATION CODES:   Not applicable  INTERVENTION:    TPN per pharmacy  Monitor for diet advancement/toleration  NUTRITION DIAGNOSIS:   Increased nutrient needs related to chronic illness, cancer and cancer related treatments, catabolic illness as evidenced by estimated needs.  Ongoing  GOAL:   Patient will meet greater than or equal to 90% of their needs  Not meeting with TPN  MONITOR:   PO intake, Supplement acceptance, Weight trends, I & O's, Labs  REASON FOR ASSESSMENT:   Malnutrition Screening Tool    ASSESSMENT:   Pt with PMH of seizures and recently diagnosed peritoneal carcinomatosis with bowel obstruction/colonic obstruction secondary to malignancy of unknown primary presents with N/V, admitted for further management/evaluation.    1/9- endo/colonoscopy showed cecal mass 1/10- exploratory laparotomy with palliative small bowel to right colon bypass, placement of G-tube 1/11- advanced to clears for comfort 1/15- TPN rate cut in half due to elevated LFTs. GI tract not functioning.   Pt underwent G tube trial clamping 1/20 which resulted in vomiting. Nursing charted pt had mucus BM this morning with slight bowel sounds. CCS to see and make determination. Pt remains on Clinimix 5/15 @ 40 ml/hr providing 682 kcal and 48 g protein. Meeting 40% of calorie needs and 60% of protein needs. Lipids continue to be off d/t elevated TGs. TPN to continue until goals of care established.   Weight noted to trend up to 151 lb. Would recommend getting daily weights to monitor trends.   Medications reviewed and include: 10 mEq KCl Labs reviewed: BUN 55 (H) Creatinine 1.68 (H) Mg 2.6 (L) ALP 270 (H) AST 51 (H) ALT 70 (H) TGs 340 (H)   Diet Order:  Diet NPO time specified Except for: Ice Chips .TPN (CLINIMIX-E) Adult  EDUCATION NEEDS:   Not appropriate for education at this time  Skin:  Skin Assessment: Reviewed RN Assessment  Last BM:  10/04/17-  mucus stool  Height:   Ht Readings from Last 1 Encounters:  09/22/17 5\' 2"  (1.575 m)    Weight:   Wt Readings from Last 1 Encounters:  10/03/17 151 lb 9.6 oz (68.8 kg)    Ideal Body Weight:  50 kg  BMI:  Body mass index is 27.73 kg/m.  Estimated Nutritional Needs:   Kcal:  1700-1900  Protein:  80-90 grams  Fluid:  >/= 1.7 L/d    Mariana Single RD, LDN Clinical Nutrition Pager # - [phone]

## 2017-10-04 NOTE — Progress Notes (Signed)
Patient ID: Christie Maynard, female   DOB: 1956/03/20, 62 y.o.   MRN: [id number] [facility] Surgery Progress Note:   11 Days Post-Op  Subjective: Mental status is clear.   Objective: Vital signs in last 24 hours: Temp:  [97.6 F (36.4 C)-98.2 F (36.8 C)] 98.2 F (36.8 C) (01/21 0512) Pulse Rate:  [97-100] 100 (01/21 0512) Resp:  [18-20] 20 (01/21 0512) BP: (116-128)/(74-84) 128/84 (01/21 0512) SpO2:  [99 %-100 %] 99 % (01/21 0512)  Intake/Output from previous day: 01/20 0701 - 01/21 0700 In: 809.7 [P.O.:30; I.V.:739.7] Out: 1475 [Drains:1475] Intake/Output this shift: Total I/O In: -  Out: 625 [Urine:100; Drains:525]  Physical Exam: Work of breathing is not labored.  Sitting up in chair.  Incision with staples and no evidence of infection.  + BM and flatus  Lab Results:  Results for orders placed or performed during the hospital encounter of 09/14/17 (from the past 48 hour(s))  Comprehensive metabolic panel     Status: Abnormal   Collection Time: 10/03/17  3:44 AM  Result Value Ref Range   Sodium 136 135 - 145 mmol/L   Potassium 3.1 (L) 3.5 - 5.1 mmol/L   Chloride 98 (L) 101 - 111 mmol/L   CO2 30 22 - 32 mmol/L   Glucose, Bld 129 (H) 65 - 99 mg/dL   BUN 59 (H) 6 - 20 mg/dL   Creatinine, Ser 1.75 (H) 0.44 - 1.00 mg/dL   Calcium 8.1 (L) 8.9 - 10.3 mg/dL   Total Protein 5.8 (L) 6.5 - 8.1 g/dL   Albumin 1.4 (L) 3.5 - 5.0 g/dL   AST 72 (H) 15 - 41 U/L   ALT 80 (H) 14 - 54 U/L   Alkaline Phosphatase 298 (H) 38 - 126 U/L   Total Bilirubin 2.3 (H) 0.3 - 1.2 mg/dL   GFR calc non Af Amer 30 (L) >60 mL/min   GFR calc Af Amer 35 (L) >60 mL/min    Comment: (NOTE) The eGFR has been calculated using the CKD EPI equation. This calculation has not been validated in all clinical situations. eGFR's persistently <60 mL/min signify possible Chronic Kidney Disease.    Anion gap 8 5 - 15  Magnesium     Status: Abnormal   Collection Time: 10/03/17  3:44 AM  Result Value Ref Range    Magnesium 2.5 (H) 1.7 - 2.4 mg/dL  Phosphorus     Status: None   Collection Time: 10/03/17  3:44 AM  Result Value Ref Range   Phosphorus 3.7 2.5 - 4.6 mg/dL  Comprehensive metabolic panel     Status: Abnormal   Collection Time: 10/04/17  3:48 AM  Result Value Ref Range   Sodium 136 135 - 145 mmol/L   Potassium 3.5 3.5 - 5.1 mmol/L   Chloride 98 (L) 101 - 111 mmol/L   CO2 30 22 - 32 mmol/L   Glucose, Bld 124 (H) 65 - 99 mg/dL   BUN 55 (H) 6 - 20 mg/dL   Creatinine, Ser 1.68 (H) 0.44 - 1.00 mg/dL   Calcium 8.2 (L) 8.9 - 10.3 mg/dL   Total Protein 6.0 (L) 6.5 - 8.1 g/dL   Albumin 1.6 (L) 3.5 - 5.0 g/dL   AST 51 (H) 15 - 41 U/L   ALT 70 (H) 14 - 54 U/L   Alkaline Phosphatase 270 (H) 38 - 126 U/L   Total Bilirubin 1.8 (H) 0.3 - 1.2 mg/dL   GFR calc non Af Amer 31 (L) >60 mL/min  GFR calc Af Amer 36 (L) >60 mL/min    Comment: (NOTE) The eGFR has been calculated using the CKD EPI equation. This calculation has not been validated in all clinical situations. eGFR's persistently <60 mL/min signify possible Chronic Kidney Disease.    Anion gap 8 5 - 15  Magnesium     Status: Abnormal   Collection Time: 10/04/17  3:48 AM  Result Value Ref Range   Magnesium 2.6 (H) 1.7 - 2.4 mg/dL  Phosphorus     Status: None   Collection Time: 10/04/17  3:48 AM  Result Value Ref Range   Phosphorus 4.0 2.5 - 4.6 mg/dL  CBC     Status: Abnormal   Collection Time: 10/04/17  3:48 AM  Result Value Ref Range   WBC 11.0 (H) 4.0 - 10.5 K/uL   RBC 3.48 (L) 3.87 - 5.11 MIL/uL   Hemoglobin 9.9 (L) 12.0 - 15.0 g/dL   HCT 30.0 (L) 36.0 - 46.0 %   MCV 86.2 78.0 - 100.0 fL   MCH 28.4 26.0 - 34.0 pg   MCHC 33.0 30.0 - 36.0 g/dL   RDW 17.7 (H) 11.5 - 15.5 %   Platelets 531 (H) 150 - 400 K/uL  Differential     Status: Abnormal   Collection Time: 10/04/17  3:48 AM  Result Value Ref Range   Neutrophils Relative % 78 %   Neutro Abs 8.6 (H) 1.7 - 7.7 K/uL   Lymphocytes Relative 13 %   Lymphs Abs 1.4 0.7 -  4.0 K/uL   Monocytes Relative 9 %   Monocytes Absolute 1.0 0.1 - 1.0 K/uL   Eosinophils Relative 0 %   Eosinophils Absolute 0.0 0.0 - 0.7 K/uL   Basophils Relative 0 %   Basophils Absolute 0.0 0.0 - 0.1 K/uL  Triglycerides     Status: Abnormal   Collection Time: 10/04/17  3:48 AM  Result Value Ref Range   Triglycerides 340 (H) <150 mg/dL  Prealbumin     Status: None   Collection Time: 10/04/17  3:48 AM  Result Value Ref Range   Prealbumin 19.1 18 - 38 mg/dL    Comment: Performed at [facility], [address], [place], [place] [address]    Radiology/Results: No results found.  Anti-infectives: Anti-infectives (From admission, onward)   Start     Dose/Rate Route Frequency Ordered Stop   09/25/17 1200  piperacillin-tazobactam (ZOSYN) IVPB 3.375 g  Status:  Discontinued     3.375 g 12.5 mL/hr over 240 Minutes Intravenous Every 8 hours 09/25/17 1054 09/30/17 0852   09/23/17 1249  cefoTEtan in Dextrose 5% (CEFOTAN) 2-2.08 GM-%(50ML) IVPB    Comments:  [name]   : cabinet override      09/23/17 1249 09/24/17 0059   09/23/17 1100  cefoTEtan (CEFOTAN) 2 g in dextrose 5 % 50 mL IVPB     2 g 100 mL/hr over 30 Minutes Intravenous  Once 09/22/17 1549 09/23/17 1333      Assessment/Plan: Problem List: Patient Active Problem List   Diagnosis Date Noted  . Leukocytosis 09/25/2017  . SBO (small bowel obstruction) ([place]) 09/22/2017  . Colonic mass 09/22/2017  . Dehydration   . Neoplasm of colon, malignant ([place])   . Nausea and vomiting 09/15/2017  . Nausea & vomiting 09/14/2017  . Peritoneal carcinomatosis ([place]) 09/14/2017  . History of seizures 09/14/2017  . Protein-calorie malnutrition, severe ([place]) 09/08/2017  . Ovarian mass 09/05/2017  . Other ascites 09/03/2017  . Ascites 09/03/2017  Will trial clamping of G tube;  Clear liquids and open to drainage if nausea.  Will give some ice cream and clears PO.   11 Days Post-Op    LOS: 19 days   Matt B. Hassell Done,  MD, [facility] [phone] beeper [phone]  10/04/2017 2:08 PM

## 2017-10-04 NOTE — Progress Notes (Signed)
PHARMACY - ADULT TOTAL PARENTERAL NUTRITION CONSULT NOTE & ANTIBIOTIC NOTE  Pharmacy Consult for TPN  Indication: bowel obstruction  Patient Measurements: Height: 5\' 2"  (157.5 cm) Weight: 151 lb 9.6 oz (68.8 kg) IBW/kg (Calculated) : 50.1 TPN AdjBW (KG): 53.3 Body mass index is 27.73 kg/m. Usual Weight: 68 kg  Insulin Requirements: SSI dc'd 1/14  Current Nutrition:  - Clear Liquid diet started 1/11 - Boost TID between meals   IVF: NS at 100 ml/hr x 24 hr 1/13-1/14, NS 10 ml/hr  Central access: PICC placed 1/6 TPN start date: 1/6  ASSESSMENT                                                                                                          HPI: Pt with PMH of seizures and recently diagnosed peritoneal carcinomatosis with bowel obstruction/colonic obstruction secondary to malignancy of unknown primary presents with N/V, admitted for further management/evaluation. CT abdomen/pelviswith contrast shows progressing SBO. Having BMs but with 1450 ml from NGT, Surgery recommends starting TPN. Patient with significant recent weight loss but primarily d/t multiple large-volume paracentesis. Poor intake since week before Xmas, and unable to keep anything down since 12/31. Appears to be at risk for refeeding.  Significant events:  1/9 endoscopy/colonoscopy showed mass in cecum 1/10:  Exp lap with palliative small bowel to right colon bypass with placement of gastrostomy 1/14 continue g tube to drain and allow to drink for comfort, tolerating clear liquids; lipids stopped 1/15 take lytes out of TPN, rate to 1/2 per CCS 2nd elevated LFTs.  Per CCS notes, GI tract not functioning at this time. GI function may never return due to tumor burden, malignant SBO, with nothing left to do surgically. TPN is not indicated in this situation but has not been addressed with family. 1/19: still no return of bowel function, CCS to try clamping G tube, still ongoing discussion of patient/family with  palliative care team 1/20: refused all doses of Boost for the past 2 days; Per Dr. Candiss Norse, continue TPN for now   Recent Labs    10/03/17 0344 10/04/17 0348  NA 136 136  K 3.1* 3.5  CL 98* 98*  CO2 30 30  GLUCOSE 129* 124*  BUN 59* 55*  CREATININE 1.75* 1.68*  CALCIUM 8.1* 8.2*  PHOS 3.7 4.0  MG 2.5* 2.6*  ALBUMIN 1.4* 1.6*  ALKPHOS 298* 270*  AST 72* 51*  ALT 80* 70*  BILITOT 2.3* 1.8*  TRIG  --  340*  PREALBUMIN  --  19.1   Today, 10/04/2017:   Glucose (goal <150):  DC SSI/CBGs 1/14, blood gluc ok  Electrolytes: removed from TPN formula 1/15  K up 3.5 s/p 4 runs on 1/20  Phos wnl  CorrCa 10.1 (ca-phos product= 40.4 (goal <55)  Mg elevated at 2.6  Na 136 stable   Renal: SCr trending down to 1.68 (crcl~31)  LFTs: slightly elevated on admit, AST/ALT down 51/70 and tbili down 1.8, alkP down 270  TGs: elevated 180 (1/7), 465 (1/14), 340 (1/21) - not receiving lipids currently  Prealbumin:  8.4 (1.7), 6.1 (1/14), 19.1 (1/21)  NUTRITIONAL GOALS                                                                                             RD recs (09/20/17):  Kcal:  1700-1900 Protein:  80-90 grams Fluid:  >/= 1.7 L/d  Clinimix-E 5/15 at a goal rate of 75 ml/hr + 20% fat emulsion 240 ml / day to provide: 90 g/day protein, 1758 Kcal/day.  PLAN                                                                                  Now: potassium chloride 10 meq IV x2 runs  Today, at 1800 today:  Continue Clinimix E 5/15 at 40 ml/hr   20% fat emulsion was stopped Monday 1/14  2nd Trig > 400 (465 1/14). Will continue to hold lipid today.  This provides 682 kcal and 48 gm protein for 40% kcal and 60 % protein needs  TPN to contain standard multivitamins and trace elements  IVF per MD  TPN lab panels on Mondays & Thursdays  F/u daily  F/u with goal of care and plan for TPN    Dia Sitter, PharmD, BCPS 10/04/2017 10:42 AM

## 2017-10-04 NOTE — Progress Notes (Signed)
Pt GT clamped per MD order for appro 88minutes. Pt had ice cream 150 cc of peach breeze and 1/2 cup of Tea--pt vomited and GT open to straight drain of 300 cc noted. Nausea improved will cont to monitor. SRP RN

## 2017-10-04 NOTE — Progress Notes (Signed)
Palliative care progress note  Reason for consult: Goals of care in light of metastatic GI disease with peritoneal carcinomatosis status post surgery with prolonged ileus  I met today with Ms. Tatro, her husband, and their son.  She reports being in poor spirits today due to the fact that she had vomiting with her G-tube clamped.  She states that she thought things were "turning around" and it was devastating to her to realize that she may not be progressing.  We had some more initial conversations regarding steps moving forward both with her hope if her bowel continues to progress in function or if this is not the case and she does not regain function of her GI tract.    Both her husband and son expressed concern that she may be becoming depressed.  Provided supportive listening and continue to work to build rapport with family.  Continue to monitor and progress conversations based upon her clinical course.  Total time: 30 minutes Greater than 50%  of this time was spent counseling and coordinating care related to the above assessment and plan.  Micheline Rough, MD [place] Team [phone]

## 2017-10-04 NOTE — Progress Notes (Signed)
Pt had small mucous stool, pt states she started with slight low abd cramps then had the urge to defacate--- small clear mucous stool noted. Distal Hypoactive bowel sounds (scattered tinkling lower quadrants) noted greater on the right. SRP, RN

## 2017-10-05 DIAGNOSIS — C189 Malignant neoplasm of colon, unspecified: Secondary | ICD-10-CM

## 2017-10-05 LAB — TRIGLYCERIDES: TRIGLYCERIDES: 325 mg/dL — AB (ref <150)

## 2017-10-05 MED ORDER — LORAZEPAM BOLUS VIA INFUSION: 0.5000 mg | Freq: Four times a day (QID) | INTRAVENOUS | Status: DC | PRN

## 2017-10-05 MED ORDER — M.V.I. ADULT IV INJ
INJECTION | INTRAVENOUS | Status: AC
Start: 2017-10-05 — End: 2017-10-06
  Administered 2017-10-05: 17:00:00 via INTRAVENOUS
  Filled 2017-10-05: qty 960

## 2017-10-05 MED ORDER — LORAZEPAM 2 MG/ML IJ SOLN
0.5000 mg | Freq: Four times a day (QID) | INTRAMUSCULAR | Status: DC | PRN
  Administered 2017-10-06: 0.5 mg via INTRAVENOUS
  Filled 2017-10-05: qty 1

## 2017-10-05 MED ORDER — LACTATED RINGERS IV SOLN
INTRAVENOUS | Status: AC
  Administered 2017-10-05: 13:00:00 via INTRAVENOUS

## 2017-10-05 MED ORDER — TRAMADOL HCL 50 MG PO TABS
50.0000 mg | ORAL_TABLET | Freq: Four times a day (QID) | ORAL | Status: DC | PRN
  Administered 2017-10-06: 50 mg via ORAL
  Filled 2017-10-05: qty 1

## 2017-10-05 MED ORDER — CLONAZEPAM 0.25 MG PO TBDP: 0.2500 mg | ORAL_TABLET | Freq: Every day | ORAL | Status: DC

## 2017-10-05 MED ORDER — CLONAZEPAM 0.5 MG PO TABS
0.2500 mg | ORAL_TABLET | Freq: Every day | ORAL | Status: DC
  Administered 2017-10-05 – 2017-10-07 (×3): 0.25 mg via ORAL
  Filled 2017-10-05 (×3): qty 1

## 2017-10-05 MED ORDER — MORPHINE SULFATE (CONCENTRATE) 10 MG/0.5ML PO SOLN
5.0000 mg | Freq: Three times a day (TID) | ORAL | Status: DC | PRN
  Administered 2017-10-06 – 2017-10-08 (×2): 5 mg via SUBLINGUAL
  Filled 2017-10-05 (×2): qty 0.5

## 2017-10-05 NOTE — Progress Notes (Signed)
PROGRESS NOTE        PATIENT DETAILS Name: Christie Maynard Age: 62 y.o. Sex: female Date of Birth: Apr 02, 1956 Admit Date: 09/14/2017 Admitting Physician Christie Maynard., MD TDD:[id number], Christie Maynard., PA-C  Brief Narrative: Patient is Christie 62 y.o. 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 She was subsequently diagnosed with small bowel obstruction, further evaluation with Christie colonoscopy revealed Christie cecal mass. She unfortunately appears to have metastatic colon cancer (and not ovarian cancer). She underwent Christie exploratory laparotomy with palliative small bowel to right colon bypass and placement of Christie gastrostomy tube on 1/10. Postoperatively, she she has developed Christie prolonged ileus, anasarca/volume overload and acute kidney injury.See below for further details.   Subjective:  Patient in bed, appears comfortable, denies any headache, no fever, no chest pain or pressure, no shortness of breath , no abdominal pain. No focal weakness.  Small BM again this morning  Assessment/Plan:   SBO (small bowel obstruction)  secondary to Christie cecal mass with now postop ileus: Underwent exploratory laparotomy, and palliative small bowel to right colon bypass with placement of Christie gastrostomy tube on 1/10.  She has developed severe ileus, being treated with supportive care with surgery following, eventually on 10/04/2017 and 10/05/2017 small BM, continue supportive care, encouraged to walk and ambulate more, minimize narcotic use monitor electrolytes, for now her G-tube is down with gravity draining gastric contents, defer management of this problem to general surgery.  General surgery requested to add sublingual morphine as needed which has been done.  Metastatic colon cancer/peritoneal carcinomatosis: She was  initially following with Dr. 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 Medical oncology (Dr. Benay Maynard) following.  Requested to see her again on 10/04/2017.  Plan is to start treatment once bowel activity is better and she is consuming some oral diet and overall little stronger.  Acute kidney injury: Improved with gentle hydration, will add LR to TNA for 24 hours and monitor, renal function gradually improving, overall likely due to dehydration at this point due to continued GI loss from the abdominal G-tube and poor oral intake with only source of nutrition being TNA.  L ureteral obstruction s/p stent placement: Due to worsening creatinine-we will go ahead and repeat Christie renal ultrasound.  Leukocytosis: Due to malignancy, afebrile and nontoxic monitor.  Anemia: Likely secondary to acute illness on top of chronic disease. Hemoglobin stable. Continue to follow periodically.  Anasarca/lower extremity edema: Improved after diuresis, currently slightly dehydrated with ARF, gentle hydration for 24 hours, monitor clinically.    Transaminitis: Remain stable-slightly increase in alkaline phosphatase-but that could be from TNA.  Seizure disorder: Continue IV Dilantin-with plans to transition to oral route when she is able to tolerate more by mouth intake.    DVT Prophylaxis: Prophylactic Lovenox   Code Status: Full code   Family Communication: Husband bedside 10/04/2017 with  Disposition Plan: Remain inpatient  Antimicrobial agents: Anti-infectives (From admission, onward)  Start     Dose/Rate Route Frequency Ordered Stop   09/25/17 1200   piperacillin-tazobactam (ZOSYN) IVPB 3.375 g  Status:  Discontinued     3.375 g 12.5 mL/hr over 240 Minutes Intravenous Every 8 hours 09/25/17 1054 09/30/17 0852   09/23/17 1249  cefoTEtan in Dextrose 5% (CEFOTAN) 2-2.08 GM-%(50ML) IVPB    Comments:  Christie Maynard   : cabinet override      09/23/17 1249 09/24/17 0059   09/23/17 1100  cefoTEtan (CEFOTAN) 2 g in dextrose 5 % 50 mL IVPB     2 g 100 mL/hr over 30 Minutes Intravenous  Once 09/22/17 1549 09/23/17 1333      Procedures:  CT-guided core biopsy of peritoneal tumor per Christie Maynard 09/17/2017  Endoscopy per Dr. Cristina Maynard 09/22/2016  Colonoscopy per Dr. Cristina Maynard 09/22/2017  Exploratory laparotomy with palliative small bowel to right colon bypass with placement of gastrostomy tube per Dr. Marlou Maynard 09/23/2017  CONSULTS:  Oncology   Gastroenterology   General surgery   GYN oncology  Palliative care   Time spent: 25- minutes-Greater than 50% of this time was spent in counseling, explanation of diagnosis, planning of further management, and coordination of care.  MEDICATIONS: Scheduled Meds: . clonazepam  0.25 mg Oral QHS  . enoxaparin (LOVENOX) injection  30 mg Subcutaneous Q24H  . feeding supplement  1 Container Oral TID BM  . phenytoin (DILANTIN) IV  100 mg Intravenous QHS   Continuous Infusions: . Marland KitchenTPN (CLINIMIX-E) Adult 40 mL/hr at 10/04/17 1805  . Marland KitchenTPN (CLINIMIX-E) Adult    . sodium chloride 10 mL/hr at 09/25/17 1214   PRN Meds:.morphine CONCENTRATE, ondansetron **OR** ondansetron (ZOFRAN) IV, sodium chloride flush, traMADol   PHYSICAL EXAM: Vital signs: Vitals:   10/04/17 1418 10/04/17 2206 10/05/17 0358 10/05/17 0610  BP: 122/86 114/87 124/85   Pulse: 95 100 97   Resp: 20 20 20    Temp: 98 F (36.7 C) 97.6 F (36.4 C) 98.1 F (36.7 C)   TempSrc: Oral Oral Oral   SpO2: 99% 100% 96%   Weight:    67.1 kg (148 lb)  Height:       Filed Weights   09/22/17 1217 10/03/17 1014 10/05/17 0610  Weight: 63 kg  (139 lb) 68.8 kg (151 lb 9.6 oz) 67.1 kg (148 lb)   Body mass index is 27.07 kg/m.   Exam  Awake alert, no focal deficits Christie Maynard.AT,PERRAL Supple Neck,No JVD, No cervical lymphadenopathy appriciated.  Symmetrical Chest wall movement, Good air movement bilaterally, CTAB RRR,No Gallops, Rubs or new Murmurs, No Parasternal Heave +ve B.Sounds, Abd is distended, midline incision staple site stable, abdominal G.tube hooked to gravity, No tenderness, No organomegaly appriciated, No rebound - guarding or rigidity. No cyanosis clubbing or edema, no new rash or skin  I have personally reviewed following labs and imaging studies  LABORATORY DATA: CBC: Recent Labs  Lab 09/29/17 0303 10/02/17 0439 10/04/17 0348  WBC 15.2* 9.0 11.0*  NEUTROABS  --   --  8.6*  HGB 9.7* 9.1* 9.9*  HCT 28.8* 27.7* 30.0*  MCV 84.0 85.2 86.2  PLT 402* 481* 531*    Basic Metabolic Panel: Recent Labs  Lab 09/30/17 0705 10/01/17 0444 10/02/17 0439 10/03/17 0344 10/04/17 0348  NA 137  137 137 136 136 136  K 3.7  3.7 3.4* 3.1* 3.1* 3.5  CL 98*  99* 97* 98* 98* 98*  CO2 29  27 29 30 30 30   GLUCOSE 109*  107* 114* 114* 129* 124*  BUN  58*  58* 62* 59* 59* 55*  CREATININE 1.73*  1.70* 1.84* 1.80* 1.75* 1.68*  CALCIUM 8.0*  8.1* 8.0* 8.0* 8.1* 8.2*  MG 2.7* 2.6* 2.5* 2.5* 2.6*  PHOS 5.0* 4.3 3.6 3.7 4.0    GFR: Estimated Creatinine Clearance: 30.8 mL/min (Christie) (by C-G formula based on SCr of 1.68 mg/dL (H)).  Liver Function Tests: Recent Labs  Lab 09/30/17 0705 10/01/17 0444 10/02/17 0439 10/03/17 0344 10/04/17 0348  AST 64* 54* 56* 72* 51*  ALT 72* 66* 66* 80* 70*  ALKPHOS 226* 246* 272* 298* 270*  BILITOT 2.3* 2.2* 2.2* 2.3* 1.8*  PROT 5.6* 5.9* 5.8* 5.8* 6.0*  ALBUMIN 1.4* 1.5* 1.4* 1.4* 1.6*   No results for input(s): LIPASE, AMYLASE in the last 168 hours. No results for input(s): AMMONIA in the last 168 hours.  Coagulation Profile: No results for input(s): INR, PROTIME in the last  168 hours.  Cardiac Enzymes: No results for input(s): CKTOTAL, CKMB, CKMBINDEX, TROPONINI in the last 168 hours.  BNP (last 3 results) No results for input(s): PROBNP in the last 8760 hours.  HbA1C: No results for input(s): HGBA1C in the last 72 hours.  CBG: No results for input(s): GLUCAP in the last 168 hours.  Lipid Profile: Recent Labs    10/04/17 0348 10/05/17 0309  TRIG 340* 325*    Thyroid Function Tests: No results for input(s): TSH, T4TOTAL, FREET4, T3FREE, THYROIDAB in the last 72 hours.  Anemia Panel: No results for input(s): VITAMINB12, FOLATE, FERRITIN, TIBC, IRON, RETICCTPCT in the last 72 hours.  Urine analysis:    Component Value Date/Time   COLORURINE AMBER (Christie) 10/02/2017 0008   APPEARANCEUR HAZY (Christie) 10/02/2017 0008   LABSPEC 1.014 10/02/2017 0008   PHURINE 5.0 10/02/2017 0008   GLUCOSEU NEGATIVE 10/02/2017 0008   HGBUR LARGE (Christie) 10/02/2017 0008   BILIRUBINUR NEGATIVE 10/02/2017 0008   KETONESUR NEGATIVE 10/02/2017 0008   PROTEINUR 30 (Christie) 10/02/2017 0008   NITRITE NEGATIVE 10/02/2017 0008   LEUKOCYTESUR TRACE (Christie) 10/02/2017 0008    Sepsis Labs: Lactic Acid, Venous    Component Value Date/Time   LATICACIDVEN 0.9 09/14/2017 2343    MICROBIOLOGY: No results found for this or any previous visit (from the past 240 hour(s)).  RADIOLOGY STUDIES/RESULTS: Dg Abd 1 View  Result Date: 09/15/2017 CLINICAL DATA:  Nasogastric tube placement.  Pelvic malignancy. EXAM: ABDOMEN - 1 VIEW COMPARISON:  Radiographs and CT 09/14/2017. FINDINGS: 1537 hours. Enteric tube projects below the diaphragm with tip in the left subphrenic region, likely in the gastric fundus. There is Christie double-J left ureteral stent. Mild bowel wall thickening is present in the left mid abdomen. No supine evidence of free intraperitoneal air. Right pelvic calcifications consistent with phleboliths are stable. IMPRESSION: Enteric tube projects over the left upper quadrant of the abdomen, likely  in the gastric fundus. Electronically Signed   By: Richardean Sale M.D.   On: 09/15/2017 16:11   Ct Abdomen Pelvis W Contrast  Result Date: 09/14/2017 CLINICAL DATA:  Abdominal swelling and vomiting. Increasing weakness. Patient is recently been diagnosed with cervical cancer. Paracentesis 1 day ago and last week. EXAM: CT ABDOMEN AND PELVIS WITH CONTRAST TECHNIQUE: Multidetector CT imaging of the abdomen and pelvis was performed using the standard protocol following bolus administration of intravenous contrast. CONTRAST:  39mL ISOVUE-300 IOPAMIDOL (ISOVUE-300) INJECTION 61% COMPARISON:  09/03/2017 FINDINGS: Lower chest: Small bilateral pleural effusions, greater on the right. Mild basilar atelectasis is likely compressive. Moderate-sized esophageal hiatal hernia. Hepatobiliary: 1 cm diameter  hypo dense lesion in segment 4 of the liver may represent Christie metastatic focus. No other focal liver lesions identified. Gallbladder and bile ducts are unremarkable. Pancreas: Pancreas is atrophic. No focal lesion or inflammatory changes appreciated. Spleen: Spleen size is normal.  No focal lesions. Adrenals/Urinary Tract: Right adrenal gland nodule measuring 2.2 cm in diameter. Hounsfield unit measurements on portal venous and delayed phase imaging demonstrate Christie relative washout of 55%. Relative washout of 40% or greater suggests benign adenoma. Renal nephrograms are homogeneous. Mildly dilated left intrarenal collecting system. Christie left ureteral stent is in place. Bladder is unremarkable. Stomach/Bowel: Stomach is not abnormally distended. There is diffuse dilatation of fluid-filled small bowel with decompressed terminal ileum. This is progressing since the previous study. Transition zone appears to be in the right lower quadrant. Appearance is consistent with small bowel obstruction. Cause of obstruction may be extrinsic compression of the small bowel, metastasis, or direct invasion. Colon is decompressed. Small amount of  residual contrast material in the colon. Appendix is normal. Vascular/Lymphatic: No significant vascular findings are present. No enlarged abdominal or pelvic lymph nodes. Reproductive: Large heterogeneous pelvic mass extending above the uterus. This likely arises from the right ovary and appears to demonstrate direct invasion of the uterine fundus on the left. The mass measures about 6.7 by 10.2 x 10.7 cm. Other: Right lower quadrant mesenteric mass measuring 4.4 cm in diameter. Diffuse nodular infiltration throughout the omentum and mesenteric fat consistent with diffuse peritoneal metastasis. Small amount of free fluid throughout the abdomen and pelvis. No free air. Abdominal wall musculature appears intact. Musculoskeletal: Mild degenerative changes in the spine. Heterogeneous appearance of the proximal left femur with trabecular coarsening. Appearance is most consistent with Paget's disease although metastatic lesion could also have this appearance. IMPRESSION: 1. Large heterogeneous pelvic mass lesion consistent with malignancy, likely ovarian in origin. Probable direct invasion of the uterus. 2. Diffuse peritoneal carcinomatosis with nodular infiltration of the omentum and mesentery and diffuse peritoneal fluid. Right lower quadrant mass measuring 4.4 cm diameter, likely metastatic. 3. Progressing small bowel obstruction with diffusely dilated fluid-filled small bowel. Transition zone is in the right lower quadrant. 4. Right adrenal gland nodule has characteristics suggesting Christie benign adenoma. 5. Bone changes in the proximal left femur probably represent Paget's disease although bone metastasis is not excluded. Consider bone scan for further evaluation if clinically indicated. 6. Left 3 atrial stent in place with mild residual hydronephrosis. 7. 1 cm low-attenuation lesion in the left lobe of the liver suspicious for metastasis. Electronically Signed   By: Lucienne Capers M.D.   On: 09/14/2017 19:29   US  Renal  Result Date: 10/01/2017 CLINICAL DATA:  Acute renal injury.  History of ovarian carcinoma EXAM: RENAL ULTRASOUND COMPARISON:  CT abdomen and pelvis September 14, 2017 FINDINGS: Right Kidney: Length: 11.6 cm. Echogenicity is mildly increased. There is renal cortical thinning. No mass or perinephric fluid. Slight hydronephrosis visualized. There is Christie small extrarenal pelvis, seen on CT. No sonographically demonstrable calculus or ureterectasis. Left Kidney: Length: 11.8 cm. Echogenicity is mildly increased. There is renal cortical thinning. No mass or perinephric fluid visualized. There is moderate hydronephrosis on the left. No renal calculus or ureterectasis on the left. Bladder: Postvoid with bladder nonvisualized. Other: Moderate ascites present. There is Christie right adrenal mass measuring 3.0 x 1.8 x 2.6 cm, also seen on recent CT. IMPRESSION: 1. Each kidney shows increased echogenicity and cortical thinning, findings indicative of medical renal disease. 2. Moderate hydronephrosis on the left.  Slight hydronephrosis on the right. Obstructing focus on each side not seen. 3.  Urinary bladder decompressed and not visualized. 4.  Right adrenal mass, unchanged from recent CT examination. 5.  Moderate ascites. Electronically Signed   By: Lowella Grip III M.D.   On: 10/01/2017 11:22   Ct Biopsy  Result Date: 09/17/2017 CLINICAL DATA:  Pelvic mass, diffuse peritoneal carcinomatosis and ascites. Prior cytology of peritoneal fluid was not conclusive for gynecologic malignancy and suggested potential GI origin. Request has now been made to perform biopsy of peritoneal tumor for more definitive diagnosis. EXAM: CT GUIDED CORE BIOPSY OF PERITONEAL MASS COMPARISON:  CT of the abdomen and pelvis on 09/14/2017 and 09/03/2017 ANESTHESIA/SEDATION: 2.0 mg IV Versed; 100 mcg IV Fentanyl Total Moderate Sedation Time:  20 minutes. The patient's level of consciousness and physiologic status were continuously monitored during  the procedure by Radiology nursing. PROCEDURE: The procedure risks, benefits, and alternatives were explained to the patient. Questions regarding the procedure were encouraged and answered. The patient understands and consents to the procedure. The abdominal wall was prepped with chlorhexidine in Christie sterile fashion, and Christie sterile drape was applied covering the operative field. Christie sterile gown and sterile gloves were used for the procedure. Local anesthesia was provided with 1% Lidocaine. CT was performed in Christie supine position. Under CT guidance, Christie 32 gauge trocar needle was advanced from an anterior oblique position into the left anterior peritoneal cavity. Core biopsy was performed with an 18 gauge core biopsy needle device. Christie total of 4 samples were obtained and submitted in formalin. Additional CT images were obtained after outer needle removal. COMPLICATIONS: None FINDINGS: Small bowel shows decompression since prior CT on 09/14/2017 after nasogastric decompression. This now allows for Christie safe window to approach of peritoneal tumor in the anterior peritoneal cavity/omental region. Tumor just deep to the left anterior abdominal wall was targeted and revealed solid tissue with biopsy. IMPRESSION: CT-guided core biopsy performed of peritoneal tumor. Electronically Signed   By: Aletta Edouard M.D.   On: 09/17/2017 16:26   Dg Chest Port 1 View  Result Date: 09/25/2017 CLINICAL DATA:  Leukocytosis and status post laparotomy and bowel bypass for colon carcinoma. EXAM: PORTABLE CHEST 1 VIEW COMPARISON:  08/30/2017 FINDINGS: Right arm PICC line extends to the distal SVC. The heart size and mediastinal contours are within normal limits. Lung volumes are low with bibasilar atelectasis present. There may be small bilateral pleural effusions. There is no evidence of pulmonary edema, consolidation, pneumothorax or nodules. The visualized skeletal structures are unremarkable. IMPRESSION: Low lung volumes with bibasilar  atelectasis. Possible small bilateral pleural effusions. Electronically Signed   By: Aletta Edouard M.D.   On: 09/25/2017 10:39   Dg Abd 2 Views  Result Date: 09/21/2017 CLINICAL DATA:  Follow-up small bowel obstruction. Clinically improved. EXAM: ABDOMEN - 2 VIEW COMPARISON:  Abdominal radiograph of January 2nd 2019 and CT scan of the abdomen of September 17, 2017. FINDINGS: There remain loops of mildly distended gas-filled small bowel to the right of midline and in the mid abdomen. There is Christie gas within the transverse colon and Christie small amount of gas within the rectum. There is gas within Christie hiatal hernia. The esophagogastric tube tip and proximal port project below the hemidiaphragms. No free extraluminal gas collections are observed. Christie double pigtail stent is present in the left kidney and ureter extending into the bladder. IMPRESSION: Persistent partial distal small bowel obstruction. No evidence of perforation. Hiatal hernia.  The esophagogastric tube  is in reasonable position. Electronically Signed   By: David  Martinique M.D.   On: 09/21/2017 13:12   Dg Abd Acute W/chest  Result Date: 09/14/2017 CLINICAL DATA:  Vomiting. EXAM: DG ABDOMEN ACUTE W/ 1V CHEST COMPARISON:  08/26/2017 FINDINGS: Left-sided nephroureteral stent is identified. Small bowel air-fluid levels are identified. Small bowel loops are mildly increased in caliber measuring up to 2.6 cm. There is no evidence of free intraperitoneal air. Heart size and mediastinal contours are within normal limits. There are bilateral pleural effusions noted right greater than left. Both lungs are clear. IMPRESSION: 1. Mildly increased caliber of small bowel loops with air-fluid levels. Cannot rule out low grade partial obstruction. 2. Status post left ureteral stenting. 3. Bilateral pleural effusions. Electronically Signed   By: Kerby Moors M.D.   On: 09/14/2017 16:58   Dg C-arm 1-60 Min-no Report  Result Date: 09/05/2017 Fluoroscopy was utilized by the  requesting physician.  No radiographic interpretation.   Ir Paracentesis  Result Date: 09/13/2017 INDICATION: Patient with recurrent ascites. Request is made for diagnostic and therapeutic paracentesis. EXAM: ULTRASOUND GUIDED DIAGNOSTIC AND THERAPEUTIC PARACENTESIS MEDICATIONS: 10 mL 2% lidocaine COMPLICATIONS: None immediate. PROCEDURE: Informed written consent was obtained from the patient after Christie discussion of the risks, benefits and alternatives to treatment. Christie timeout was performed prior to the initiation of the procedure. Initial ultrasound scanning demonstrates Christie large amount of ascites within the left lateral abdomen. The left lateral abdomen was prepped and draped in the usual sterile fashion. 2% lidocaine was used for local anesthesia. Following this, Christie 19 gauge, 7-cm, Yueh catheter was introduced. An ultrasound image was saved for documentation purposes. The paracentesis was performed. The catheter was removed and Christie dressing was applied. The patient tolerated the procedure well without immediate post procedural complication. FINDINGS: Christie total of approximately 2.0 liters of red-colored fluid was removed. Samples were sent to the laboratory as requested by the clinical team. IMPRESSION: Successful ultrasound-guided paracentesis yielding 2.0 liters of peritoneal fluid. Read by:  Brynda Greathouse PA-C Electronically Signed   By: Jerilynn Mages.  Shick M.D.   On: 09/13/2017 15:21     LOS: 20 days   Signature  Lala Lund M.D on 10/05/2017 at 11:33 AM  Between 7am to 7pm - Pager - [phone] ( page via [email or web address], text pages only, please mention full 10 digit call back number).  After 7pm go to [email or web address] - password [email or web address]

## 2017-10-05 NOTE — Progress Notes (Signed)
PHARMACY - ADULT TOTAL PARENTERAL NUTRITION CONSULT NOTE & ANTIBIOTIC NOTE  Pharmacy Consult for TPN  Indication: bowel obstruction  Patient Measurements: Height: 5\' 2"  (157.5 cm) Weight: 148 lb (67.1 kg) IBW/kg (Calculated) : 50.1 TPN AdjBW (KG): 53.3 Body mass index is 27.07 kg/m. Usual Weight: 68 kg  Insulin Requirements: SSI dc'd 1/14  Current Nutrition:  - Clear Liquid diet started 1/11 - Boost TID between meals   IVF: NS at 100 ml/hr x 24 hr 1/13-1/14, NS 10 ml/hr  Central access: PICC placed 1/6 TPN start date: 1/6  ASSESSMENT                                                                                                          HPI: Pt with PMH of seizures and recently diagnosed peritoneal carcinomatosis with bowel obstruction/colonic obstruction secondary to malignancy of unknown primary presents with N/V, admitted for further management/evaluation. CT abdomen/pelviswith contrast shows progressing SBO. Having BMs but with 1450 ml from NGT, Surgery recommends starting TPN. Patient with significant recent weight loss but primarily d/t multiple large-volume paracentesis. Poor intake since week before Xmas, and unable to keep anything down since 12/31. Appears to be at risk for refeeding.  Significant events:  1/9 endoscopy/colonoscopy showed mass in cecum 1/10:  Exp lap with palliative small bowel to right colon bypass with placement of gastrostomy 1/14 continue g tube to drain and allow to drink for comfort, tolerating clear liquids; lipids stopped 1/15 take lytes out of TPN, rate to 1/2 per CCS 2nd elevated LFTs.  Per CCS notes, GI tract not functioning at this time. GI function may never return due to tumor burden, malignant SBO, with nothing left to do surgically. TPN is not indicated in this situation but has not been addressed with family. 1/19: still no return of bowel function, CCS to try clamping G tube, still ongoing discussion of patient/family with palliative care  team 1/21: refused all doses of Boost for the past 2 days; Per Dr. Candiss Norse, continue TPN for now; trial of clamping Gtube, pt vomited after eating ice cream and peach breeze 1/22:    Recent Labs    10/03/17 0344 10/04/17 0348 10/05/17 0309  NA 136 136  --   K 3.1* 3.5  --   CL 98* 98*  --   CO2 30 30  --   GLUCOSE 129* 124*  --   BUN 59* 55*  --   CREATININE 1.75* 1.68*  --   CALCIUM 8.1* 8.2*  --   PHOS 3.7 4.0  --   MG 2.5* 2.6*  --   ALBUMIN 1.4* 1.6*  --   ALKPHOS 298* 270*  --   AST 72* 51*  --   ALT 80* 70*  --   BILITOT 2.3* 1.8*  --   TRIG  --  340* 325*  PREALBUMIN  --  19.1  --    Today, 10/05/2017:   Glucose (goal <150):  DC SSI/CBGs 1/14, blood gluc ok  Electrolytes: last labs on 1/21  K up 3.5 s/p 4 runs  on 1/20  Phos wnl  CorrCa 10.1 (ca-phos product= 40.4 (goal <55)  Mg elevated at 2.6  Na 136 stable   Renal: SCr trending down to 1.68 (crcl~31)  LFTs: slightly elevated on admit, AST/ALT down 51/70 and tbili down 1.8, alkP down 270  TGs: elevated 180 (1/7), 465 (1/14), 340 (1/21), 325 (1/22) - not receiving lipids currently  Prealbumin: 8.4 (1.7), 6.1 (1/14), 19.1 (1/21)  NUTRITIONAL GOALS                                                                                             RD recs (09/20/17):  Kcal:  1700-1900 Protein:  80-90 grams Fluid:  >/= 1.7 L/d  Clinimix-E 5/15 at a goal rate of 75 ml/hr + 20% fat emulsion 240 ml / day to provide: 90 g/day protein, 1758 Kcal/day.  PLAN                                                                                  Today, at 1800 today:  Continue Clinimix E 5/15 at 40 ml/hr   20% fat emulsion was stopped Monday 1/14  2nd Trig > 400. Will continue to hold lipid today.  If TG cont to trend down and value is close to lower 200s with labs on 1/24, will consider resuming lipid  This provides 682 kcal and 48 gm protein for 40% kcal and 60 % protein needs  TPN to contain standard multivitamins and  trace elements  IVF per MD  TPN lab panels on Mondays & Thursdays  F/u daily  F/u with goal of care and plan for TPN    Dia Sitter, PharmD, BCPS 10/05/2017 7:33 AM

## 2017-10-05 NOTE — Progress Notes (Signed)
[facility] Progress Note  12 Days Post-Op  Subjective: CC-  Patient resting comfortably. Denies any current pain. Passing small amount of flatus and had another small mucousy BM this morning. Tried clamping G-tube again this morning but got nauseated.   Objective: Vital signs in last 24 hours: Temp:  [97.6 F (36.4 C)-98.1 F (36.7 C)] 98.1 F (36.7 C) (01/22 0358) Pulse Rate:  [95-100] 97 (01/22 0358) Resp:  [20] 20 (01/22 0358) BP: (114-124)/(85-87) 124/85 (01/22 0358) SpO2:  [96 %-100 %] 96 % (01/22 0358) Weight:  [148 lb (67.1 kg)] 148 lb (67.1 kg) (01/22 0610) Last BM Date: 10/05/17(small-mucous)  Intake/Output from previous day: 01/21 0701 - 01/22 0700 In: 1790 [P.O.:540; I.V.:1210] Out: 3400 [Urine:825; Emesis/NG output:100; Drains:2475] Intake/Output this shift: Total I/O In: 260 [P.O.:260] Out: 700 [Urine:100; Drains:600]  PE: Gen: Alert, NAD, pleasant HEENT: EOM's intact, pupils equal and round Card: RRR, no M/G/R heard Pulm:effort normal Abd: mildly firm, distended,+, midlineincision C/D/Iwith staples intact and no erythema or drainage, G-tube to gravity Psych: A&Ox3  Skin: no rashes noted, warm and dry  Lab Results:  Recent Labs    10/04/17 0348  WBC 11.0*  HGB 9.9*  HCT 30.0*  PLT 531*   BMET Recent Labs    10/03/17 0344 10/04/17 0348  NA 136 136  K 3.1* 3.5  CL 98* 98*  CO2 30 30  GLUCOSE 129* 124*  BUN 59* 55*  CREATININE 1.75* 1.68*  CALCIUM 8.1* 8.2*   PT/INR No results for input(s): LABPROT, INR in the last 72 hours. CMP     Component Value Date/Time   NA 136 10/04/2017 0348   K 3.5 10/04/2017 0348   CL 98 (L) 10/04/2017 0348   CO2 30 10/04/2017 0348   GLUCOSE 124 (H) 10/04/2017 0348   BUN 55 (H) 10/04/2017 0348   CREATININE 1.68 (H) 10/04/2017 0348   CALCIUM 8.2 (L) 10/04/2017 0348   PROT 6.0 (L) 10/04/2017 0348   ALBUMIN 1.6 (L) 10/04/2017 0348   AST 51 (H) 10/04/2017 0348   ALT 70 (H) 10/04/2017  0348   ALKPHOS 270 (H) 10/04/2017 0348   BILITOT 1.8 (H) 10/04/2017 0348   GFRNONAA 31 (L) 10/04/2017 0348   GFRAA 36 (L) 10/04/2017 0348   Lipase     Component Value Date/Time   LIPASE 69 (H) 09/14/2017 1149       Studies/Results: No results found.  Anti-infectives: Anti-infectives (From admission, onward)   Start     Dose/Rate Route Frequency Ordered Stop   09/25/17 1200  piperacillin-tazobactam (ZOSYN) IVPB 3.375 g  Status:  Discontinued     3.375 g 12.5 mL/hr over 240 Minutes Intravenous Every 8 hours 09/25/17 1054 09/30/17 0852   09/23/17 1249  cefoTEtan in Dextrose 5% (CEFOTAN) 2-2.08 GM-%(50ML) IVPB    Comments:  [name]   : cabinet override      09/23/17 1249 09/24/17 0059   09/23/17 1100  cefoTEtan (CEFOTAN) 2 g in dextrose 5 % 50 mL IVPB     2 g 100 mL/hr over 30 Minutes Intravenous  Once 09/22/17 1549 09/23/17 1333       Assessment/Plan SBO/cecal mass Metastatic colon cancer, carcinomatosis S/pEXPLORATORY LAPAROTOMY WITH PALLIATIVE SMALL BOWEL TO RIGHT COLON BY PASS WITH PLACEMENT OF GASTROSTOMY TUBE1/10 Dr. Marlou Starks - POD12 - appreciate palliative consult, patient desires to "continue with current care plan for the next several days while we are waiting to see if she has any return of bowel function" -Ambulate/IS - passing small  amount of flatus and having small mucousy BMs, not tolerating clamping trials well  Elevated creatinine- Cr up to 1.68, stable Elevated LFTs - may be 2/2 TNA  ID -zosyn 1/12>> 1/16, cefotetan 1/10>>1/11 FEN -TPN@ 40 ml/hr, clear liquids, Boost VTE -lovenox Foley -none Follow up -Dr. Marlou Starks  Plan - Passing some flatus and patient has improved bowel sounds compared to last week. Continue intermittent G-tube clamping trials but change to every 2 hours, unclamp if patient become nauseated.   LOS: 20 days    Wellington Hampshire , [facility] 10/05/2017, 11:47 AM Pager: [phone] Consults:  [phone] Mon-Fri 7:00 am-4:30 pm Sat-Sun 7:00 am-11:30 am

## 2017-10-05 NOTE — Progress Notes (Signed)
Medications administered by student RN 0700-1700 with supervision of Clinical Instructor Lynzee Lindquist MSN, RN-BC or patient's assigned RN.   

## 2017-10-05 NOTE — Progress Notes (Signed)
IP PROGRESS NOTE  Subjective:   Christie Maynard is unable to tolerate clamping of the G-tube.  She is has not had another bowel movement.  No pain. Objective: Vital signs in last 24 hours: Blood pressure 118/84, pulse (!) 113, temperature 98 F (36.7 C), temperature source Oral, resp. rate 20, height 5' 2"  (1.575 m), weight 148 lb (67.1 kg), SpO2 100 %.  Intake/Output from previous day: 01/21 0701 - 01/22 0700 In: 1790 [P.O.:540; I.V.:1210] Out: 3400 [Urine:825; Emesis/NG output:100; Drains:2475]  Physical Exam:  HEENT: No thrush  Abdomen: Distended, midline incision with staples in place, left upper quadrant gastrostomy tube site with a gauze dressing Vascular: 1+ pitting edema at the lower leg bilaterally     Lab Results: Recent Labs    10/04/17 0348  WBC 11.0*  HGB 9.9*  HCT 30.0*  PLT 531*    BMET Recent Labs    10/03/17 0344 10/04/17 0348  NA 136 136  K 3.1* 3.5  CL 98* 98*  CO2 30 30  GLUCOSE 129* 124*  BUN 59* 55*  CREATININE 1.75* 1.68*  CALCIUM 8.1* 8.2*    Lab Results  Component Value Date   CEA1 2,751.0 (H) 09/16/2017    Medications: I have reviewed the patient's current medications.  Assessment/Plan: 1. Metastatic colon cancer  Initial presentation with nausea and bloating.   CT abdomen/pelvis 09/03/2017 showed a multilobulated irregular heterogeneous enhancing soft tissue mass within the pelvis either involving both ovaries or a single mass arising from the right and extending across the midline with components measuring 10.6 x 4.9 x 9.8 cm and 8.2 x 4.9 x 4.8 cm.  There was associated ascites and omental caking consistent with peritoneal carcinomatosis.  A distal small bowel obstruction was suspected.  There was distal left ureteral obstruction with left hydronephrosis and hydroureter.    Ultrasound paracentesis was performed 09/04/2017.  Atypical cells were present, suspicious for adenocarcinoma of the GI tract.   Paracentesis 09/13/2017 with  atypical cells present.  CT 09/14/2017 showed a progressing small bowel obstruction with diffusely dilated fluid-filled small bowel.  Transition zone in the right lower quadrant.  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  Right lower quadrant mass measuring 4.4 cm.    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  Nonspecific staining for PAX-8.  Negative for estrogen receptor, progesterone receptor and cytokeratin 7.  Colonoscopy 09/22/2017-cecum mass, biopsy confirmed adenocarcinoma with signet cell features  Exploratory laparotomy 09/23/2017- fixed pelvic and cecum masses, carcinomatosis with tumor implants at the abdominal wall, bowel, and omentum, status post a palliative small bowel to right colon bypass and placement of a gastrostomy tube 2. Small bowel obstruction secondary to carcinomatosis and a right cecum tumor, status post palliative small bowel to right colon bypass and placement of gastrostomy tube 09/23/2017 3. Distal left ureteral obstruction with left hydronephrosis and hydroureter status post left ureteral stent placement 09/05/2017. 4. Seizure 1983. 5. Significant family history for breast cancer. 6. Renal failure-ultrasound 10/01/2017, mild right and moderate left hydronephrosis   Christie Maynard appears unchanged.  She has persistent obstructive symptoms secondary to carcinomatosis.  I discussed the case with Dr. Excell Seltzer. I saw Christie Maynard this morning and again this evening.  Her husband and son are at the bedside this  evening.  We discussed comfort care/hospice versus a trial of inpatient chemotherapy.  She understands the chance of relieving the bowel obstruction with  chemotherapy is small.  She would like to proceed with a trial of chemotherapy. I recommend FOLFOX chemotherapy.  We reviewed the potential toxicities associated with this regimen including the chance for nausea/vomiting, mucositis, diarrhea, alopecia, and hematologic toxicity.  We discussed the sun sensitivity, rash, hyperpigmentation, and hand/foot syndrome associated with 5-fluorouracil.  We discussed the allergic reaction and various types of neuropathy seen with oxaliplatin.  She will need to come off of telemetry and transfer to the inpatient oncology unit to receive chemotherapy.  We discussed discharge to home at the completion of chemotherapy with continuation of TNA and outpatient follow-up at the Cancer center.  Recommendations:  1.  Transfer to third floor oncology unit for chemotherapy to be given 10/06/2017 2.  FOLFOX chemotherapy 10/06/2017 3.  Follow-up Foundation 1 testing on the peritoneal biopsy from 09/17/2017 4.  I continue to increase ambulation and diet as tolerated 5.  Continue TNA       LOS: 20 days   Betsy Coder, MD   10/05/2017, 6:24 PM

## 2017-10-05 NOTE — Progress Notes (Signed)
Pt up to the bathroom, passed "mucous-like stool substance" from rectum. Pt states she felt urgency. Will cont to monitor. SRP, RN

## 2017-10-05 NOTE — Progress Notes (Signed)
Clamped G-Tube for breakfast, prior before clamping pt ate half liquids and emptied 600 cc via G-Tube. Asked pt to call if become nausea husband at bedside. Will cont to monitor. SRP, RN

## 2017-10-05 NOTE — Progress Notes (Signed)
OT Cancellation Note  Patient Details Name: Christie Maynard MRN: [id number] DOB: 1956-01-21   Cancelled Treatment:    Reason Eval/Treat Not Completed: Fatigue/lethargy limiting ability to participate  Pt had just gotten back to bed- will check on pt next day Kari Baars, [place]  Payton Mccallum D 10/05/2017, 10:15 AM

## 2017-10-05 NOTE — Progress Notes (Signed)
Pt become nauseated after eating remaining of liquids. Open G-tube 200 cc of liquids noted, nausea improved. Will cont to monitor. SRP, RN

## 2017-10-05 NOTE — Progress Notes (Addendum)
G-tube clamped as ordered. Will monitor for nausea. Up in the chair sipping liquids for lunch. SRP, RN

## 2017-10-05 NOTE — Progress Notes (Addendum)
Clamped G-tube @ 1230 pm pt sipping liquids intermittently, called to pt room; leakage noted around G-tube site trinkling down abdomen. Opened G-tube to straight drain, leakage around g-tube stopped --- 250-300 cc drainage noted in drainage bag equal to amount pt stated she ingested ( 120 cc juice and 200 cc tea) PA-C updated and will cont to monitor. SRP, RN

## 2017-10-05 NOTE — Progress Notes (Signed)
G-tube clamping on hold for now, pt noted with leakage around the G-tube site during clamping. SRP, RN

## 2017-10-06 ENCOUNTER — Other Ambulatory Visit: Payer: Self-pay | Admitting: *Deleted

## 2017-10-06 DIAGNOSIS — C786 Secondary malignant neoplasm of retroperitoneum and peritoneum: Secondary | ICD-10-CM

## 2017-10-06 DIAGNOSIS — C801 Malignant (primary) neoplasm, unspecified: Principal | ICD-10-CM

## 2017-10-06 DIAGNOSIS — Z5111 Encounter for antineoplastic chemotherapy: Secondary | ICD-10-CM

## 2017-10-06 DIAGNOSIS — R109 Unspecified abdominal pain: Secondary | ICD-10-CM

## 2017-10-06 LAB — COMPREHENSIVE METABOLIC PANEL
ALBUMIN: 1.5 g/dL — AB (ref 3.5–5.0)
ALK PHOS: 251 U/L — AB (ref 38–126)
ALT: 73 U/L — ABNORMAL HIGH (ref 14–54)
ANION GAP: 9 (ref 5–15)
AST: 54 U/L — AB (ref 15–41)
BUN: 51 mg/dL — ABNORMAL HIGH (ref 6–20)
CALCIUM: 8.3 mg/dL — AB (ref 8.9–10.3)
CO2: 33 mmol/L — AB (ref 22–32)
Chloride: 100 mmol/L — ABNORMAL LOW (ref 101–111)
Creatinine, Ser: 1.46 mg/dL — ABNORMAL HIGH (ref 0.44–1.00)
GFR calc Af Amer: 43 mL/min — ABNORMAL LOW (ref >60)
GFR calc non Af Amer: 37 mL/min — ABNORMAL LOW (ref >60)
GLUCOSE: 119 mg/dL — AB (ref 65–99)
Potassium: 3.3 mmol/L — ABNORMAL LOW (ref 3.5–5.1)
SODIUM: 142 mmol/L (ref 135–145)
Total Bilirubin: 1.7 mg/dL — ABNORMAL HIGH (ref 0.3–1.2)
Total Protein: 5.9 g/dL — ABNORMAL LOW (ref 6.5–8.1)

## 2017-10-06 LAB — CBC
HCT: 29.5 % — ABNORMAL LOW (ref 36.0–46.0)
HEMOGLOBIN: 9.5 g/dL — AB (ref 12.0–15.0)
MCH: 28.1 pg (ref 26.0–34.0)
MCHC: 32.2 g/dL (ref 30.0–36.0)
MCV: 87.3 fL (ref 78.0–100.0)
Platelets: 519 10*3/uL — ABNORMAL HIGH (ref 150–400)
RBC: 3.38 MIL/uL — AB (ref 3.87–5.11)
RDW: 18.2 % — ABNORMAL HIGH (ref 11.5–15.5)
WBC: 14.1 10*3/uL — AB (ref 4.0–10.5)

## 2017-10-06 LAB — PHOSPHORUS: PHOSPHORUS: 4.2 mg/dL (ref 2.5–4.6)

## 2017-10-06 LAB — MAGNESIUM: Magnesium: 2.4 mg/dL (ref 1.7–2.4)

## 2017-10-06 LAB — GLUCOSE, CAPILLARY
GLUCOSE-CAPILLARY: 116 mg/dL — AB (ref 65–99)
GLUCOSE-CAPILLARY: 124 mg/dL — AB (ref 65–99)
GLUCOSE-CAPILLARY: 111 mg/dL — AB (ref 65–99)
GLUCOSE-CAPILLARY: 164 mg/dL — AB (ref 65–99)
Glucose-Capillary: 138 mg/dL — ABNORMAL HIGH (ref 65–99)

## 2017-10-06 MED ORDER — PALONOSETRON HCL INJECTION 0.25 MG/5ML
0.2500 mg | Freq: Once | INTRAVENOUS | Status: AC
  Administered 2017-10-06: 0.25 mg via INTRAVENOUS
  Filled 2017-10-06: qty 5

## 2017-10-06 MED ORDER — SODIUM CHLORIDE 0.9% FLUSH: 3.0000 mL | INTRAVENOUS | Status: DC | PRN

## 2017-10-06 MED ORDER — POTASSIUM CHLORIDE 20 MEQ/15ML (10%) PO SOLN
40.0000 meq | Freq: Once | ORAL | Status: AC
  Administered 2017-10-06: 40 meq via ORAL
  Filled 2017-10-06: qty 30

## 2017-10-06 MED ORDER — ALUM & MAG HYDROXIDE-SIMETH 200-200-20 MG/5ML PO SUSP
30.0000 mL | ORAL | Status: DC | PRN
  Administered 2017-10-06 (×2): 30 mL via ORAL
  Filled 2017-10-06 (×2): qty 30

## 2017-10-06 MED ORDER — ALTEPLASE 2 MG IJ SOLR: 2.0000 mg | Freq: Once | INTRAMUSCULAR | Status: DC | PRN

## 2017-10-06 MED ORDER — OXALIPLATIN CHEMO INJECTION 100 MG/20ML
85.0000 mg/m2 | Freq: Once | INTRAVENOUS | Status: AC
  Administered 2017-10-06: 140 mg via INTRAVENOUS
  Filled 2017-10-06: qty 28

## 2017-10-06 MED ORDER — FLUOROURACIL CHEMO INJECTION 2.5 GM/50ML
400.0000 mg/m2 | Freq: Once | INTRAVENOUS | Status: AC
  Administered 2017-10-06: 650 mg via INTRAVENOUS
  Filled 2017-10-06: qty 13

## 2017-10-06 MED ORDER — SODIUM CHLORIDE 0.9% FLUSH: 10.0000 mL | INTRAVENOUS | Status: DC | PRN

## 2017-10-06 MED ORDER — DEXAMETHASONE SODIUM PHOSPHATE 100 MG/10ML IJ SOLN
10.0000 mg | Freq: Once | INTRAMUSCULAR | Status: AC
  Administered 2017-10-06: 10 mg via INTRAVENOUS
  Filled 2017-10-06: qty 1

## 2017-10-06 MED ORDER — TRACE MINERALS CR-CU-MN-SE-ZN 10-1000-500-60 MCG/ML IV SOLN
INTRAVENOUS | Status: AC
  Administered 2017-10-06: 17:00:00 via INTRAVENOUS
  Filled 2017-10-06: qty 1440

## 2017-10-06 MED ORDER — DEXTROSE 5 % IV SOLN
Freq: Once | INTRAVENOUS | Status: AC
  Administered 2017-10-06: 13:00:00 via INTRAVENOUS

## 2017-10-06 MED ORDER — HEPARIN SOD (PORK) LOCK FLUSH 100 UNIT/ML IV SOLN: 250.0000 [IU] | Freq: Once | INTRAVENOUS | Status: DC | PRN

## 2017-10-06 MED ORDER — TRACE MINERALS CR-CU-MN-SE-ZN 10-1000-500-60 MCG/ML IV SOLN
INTRAVENOUS | Status: DC
  Filled 2017-10-06: qty 960

## 2017-10-06 MED ORDER — FLUOROURACIL CHEMO INJECTION 5 GM/100ML
2400.0000 mg/m2 | Freq: Once | INTRAVENOUS | Status: AC
  Administered 2017-10-06: 4000 mg via INTRAVENOUS
  Filled 2017-10-06: qty 80

## 2017-10-06 MED ORDER — LEUCOVORIN CALCIUM INJECTION 350 MG
400.0000 mg/m2 | Freq: Once | INTRAVENOUS | Status: AC
  Administered 2017-10-06: 668 mg via INTRAVENOUS
  Filled 2017-10-06: qty 33.4

## 2017-10-06 MED ORDER — POTASSIUM CHLORIDE CRYS ER 20 MEQ PO TBCR
40.0000 meq | EXTENDED_RELEASE_TABLET | Freq: Once | ORAL | Status: DC
  Filled 2017-10-06: qty 2

## 2017-10-06 MED ORDER — HEPARIN SOD (PORK) LOCK FLUSH 100 UNIT/ML IV SOLN: 500.0000 [IU] | Freq: Once | INTRAVENOUS | Status: DC | PRN

## 2017-10-06 NOTE — Care Management Note (Signed)
Case Management Note  Patient Details  Name: Christie Maynard MRN: [id number] Date of Birth: 1955/11/10  Subjective/Objective:   62 yo admitted with Peritoneal carcinomatosis.                 Action/Plan: From home with husband. Per MD note, pt to dc home with TPN. Pt offered choice for home health services and chose [facility]. [facility] rep alerted of referral. Pt also requesting RW and 3in1. Orders received and [facility] rep made aware.  Expected Discharge Date:                  Expected Discharge Plan:  [place]  In-House Referral:     Discharge planning Services  CM Consult  Post Acute Care Choice:  Home Health Choice offered to:  Patient  DME Arranged:  3-N-1, Walker rolling DME Agency:  [place]:  RN, TPN [place] Agency:  [place]  Status of Service:  In process, will continue to follow  If discussed at Long Length of Stay Meetings, dates discussed:    Additional CommentsLynnell Catalan, RN 10/06/2017, 1:52 PM  [phone]

## 2017-10-06 NOTE — Progress Notes (Signed)
Physical Therapy Treatment Patient Details Name: Christie Maynard MRN: [id number] DOB: April 27, 1956 Today's Date: 10/06/2017    History of Present Illness Patient is 62 year old female with known history of seizures and recently diagnosed peritoneal carcinomatosis with left ureteral obstruction, status post stent placement due to suspected stage IIIc ovarian cancer, presented with nausea and vomiting 24 hours in duration. Patient reports poor oral intake and unable to eat solid foods for past few weeks but 24 hours prior to this admission, she felt a lot worse, bloated, uncomfortable. /p EXPLORATORY LAPAROTOMY WITH PALLIATIVE SMALL BOWEL TO RIGHT COLON BY PASS WITH PLACEMENT OF GASTROSTOMY TUBE 1/10 Dr    PT Comments    Pt very cooperative and eager to ambulate but continues to fatigue easily with exertion.   Follow Up Recommendations  No PT follow up     Equipment Recommendations  Rolling walker with 5" wheels    Recommendations for Other Services       Precautions / Restrictions Precautions Precautions: Fall Precaution Comments: multiple lines Restrictions Weight Bearing Restrictions: No    Mobility  Bed Mobility Overal bed mobility: Needs Assistance Bed Mobility: Rolling Rolling: Supervision Sidelying to sit: Supervision;HOB elevated          Transfers Overall transfer level: Needs assistance Equipment used: Rolling walker (2 wheeled) Transfers: Sit to/from Stand Sit to Stand: Supervision         General transfer comment: VCs hand placement  Ambulation/Gait Ambulation/Gait assistance: Min guard;Supervision Ambulation Distance (Feet): 200 Feet Assistive device: Rolling walker (2 wheeled) Gait Pattern/deviations: Step-through pattern Gait velocity: decr Gait velocity interpretation: Below normal speed for age/gender General Gait Details: Cues for position from RW; slow but steady pace, no LOB, distance limited by fatigue   Stairs            Wheelchair  Mobility    Modified Rankin (Stroke Patients Only)       Balance                                            Cognition Arousal/Alertness: Awake/alert Behavior During Therapy: WFL for tasks assessed/performed Overall Cognitive Status: Within Functional Limits for tasks assessed                                        Exercises      General Comments        Pertinent Vitals/Pain Pain Assessment: No/denies pain    Home Living                      Prior Function            PT Goals (current goals can now be found in the care plan section) Acute Rehab PT Goals Patient Stated Goal: take care of 4 y.o. grandaughter PT Goal Formulation: With patient Time For Goal Achievement: 10/12/17 Potential to Achieve Goals: Good Progress towards PT goals: Progressing toward goals    Frequency    Min 3X/week      PT Plan Current plan remains appropriate    Co-evaluation              AM-PAC PT "6 Clicks" Daily Activity  Outcome Measure  Difficulty turning over in bed (including adjusting bedclothes, sheets and blankets)?: A Little Difficulty moving from  lying on back to sitting on the side of the bed? : A Little Difficulty sitting down on and standing up from a chair with arms (e.g., wheelchair, bedside commode, etc,.)?: A Little Help needed moving to and from a bed to chair (including a wheelchair)?: A Little Help needed walking in hospital room?: A Little Help needed climbing 3-5 steps with a railing? : A Lot 6 Click Score: 17    End of Session Equipment Utilized During Treatment: Gait belt Activity Tolerance: Patient tolerated treatment well Patient left: with call bell/phone within reach;in chair Nurse Communication: Mobility status PT Visit Diagnosis: Difficulty in walking, not elsewhere classified (R26.2)     Time: 7048-8891 PT Time Calculation (min) (ACUTE ONLY): 22 min  Charges:  $Gait Training: 8-22  mins                    G Codes:       QX [phone]    Christie Maynard 10/06/2017, 3:35 PM

## 2017-10-06 NOTE — Progress Notes (Signed)
Patient c/o abdominal pain 5 mins  After  flourouracil was hung. Chemo was paused  for 64mins, v/s checked, pain med given with maalox. Dr Earlie Server was paged. Patient is calm, orders received to restart chemo and monitor. VIew MAR for medication received.

## 2017-10-06 NOTE — Progress Notes (Signed)
IP PROGRESS NOTE  Subjective:   Christie Maynard reports having some output per rectum.  No nausea or pain.  The G-tube remains open to drainage. Objective: Vital signs in last 24 hours: Blood pressure 113/76, pulse 97, temperature 98.1 F (36.7 C), temperature source Oral, resp. rate 18, height 5' 2"  (1.575 m), weight 150 lb 12.7 oz (68.4 kg), SpO2 99 %.  Intake/Output from previous day: 01/22 0701 - 01/23 0700 In: 2201 [P.O.:900; I.V.:1281] Out: 3900 [Urine:800; Drains:3100]  Physical Exam:  HEENT: No thrush Lungs: Decreased breath sounds at the right lower posterior chest with an inspiratory rub, no respiratory distress Cardiac: Regular rate and rhythm Abdomen: Distended, midline incision with staples in place, left upper quadrant gastrostomy tube site with a gauze dressing Vascular: Trace pitting edema at the feet     Lab Results: Recent Labs    10/04/17 0348 10/06/17 0411  WBC 11.0* 14.1*  HGB 9.9* 9.5*  HCT 30.0* 29.5*  PLT 531* 519*    BMET Recent Labs    10/04/17 0348 10/06/17 0411  NA 136 142  K 3.5 3.3*  CL 98* 100*  CO2 30 33*  GLUCOSE 124* 119*  BUN 55* 51*  CREATININE 1.68* 1.46*  CALCIUM 8.2* 8.3*    Lab Results  Component Value Date   CEA1 2,751.0 (H) 09/16/2017    Medications: I have reviewed the patient's current medications.  Assessment/Plan: 1. Metastatic colon cancer  Initial presentation with nausea and bloating.   CT abdomen/pelvis 09/03/2017 showed a multilobulated irregular heterogeneous enhancing soft tissue mass within the pelvis either involving both ovaries or a single mass arising from the right and extending across the midline with components measuring 10.6 x 4.9 x 9.8 cm and 8.2 x 4.9 x 4.8 cm.  There was associated ascites and omental caking consistent with peritoneal carcinomatosis.  A distal small bowel obstruction was suspected.  There was distal left ureteral obstruction with left hydronephrosis and hydroureter.     Ultrasound paracentesis was performed 09/04/2017.  Atypical cells were present, suspicious for adenocarcinoma of the GI tract.   Paracentesis 09/13/2017 with atypical cells present.  CT 09/14/2017 showed a progressing small bowel obstruction with diffusely dilated fluid-filled small bowel.  Transition zone in the right lower quadrant.  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  Right lower quadrant mass measuring 4.4 cm.    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  Nonspecific staining for PAX-8.  Negative for estrogen receptor, progesterone receptor and cytokeratin 7.  Colonoscopy 09/22/2017-cecum mass, biopsy confirmed adenocarcinoma with signet cell features  Exploratory laparotomy 09/23/2017- fixed pelvic and cecum masses, carcinomatosis with tumor implants at the abdominal wall, bowel, and omentum, status post a palliative small bowel to right colon bypass and placement of a gastrostomy tube  Cycle 1 FOLFOX 10/06/2017 2. Small bowel obstruction secondary to carcinomatosis and a right cecum tumor, status post palliative small bowel to right colon bypass and placement of gastrostomy tube 09/23/2017 3. Distal left ureteral obstruction with left hydronephrosis and hydroureter status post left ureteral stent placement 09/05/2017. 4. Seizure 1983. 5. Significant family history for breast cancer. 6. Renal failure-ultrasound 10/01/2017, mild right and moderate left hydronephrosis   Christie Maynard appears stable.  The plan is to proceed with FOLFOX chemotherapy today.  She will receive additional chemotherapy teaching from the nurse educator. I reviewed the FOLFOX plan with the inpatient  pharmacy. She should be stable for discharge to home at the completion of chemotherapy.  She will be discharged on home TPN.  Recommendations:  1.  Cycle 1 FOLFOX today 2.  Make arrangements for home TPN at discharge 3.  Follow-up Foundation 1 testing on the peritoneal biopsy from 09/17/2017 4.  Increase ambulation as tolerated 5.  Outpatient follow-up will be arranged at the Cancer center for an office visit and cycle 2 FOLFOX on 10/20/2017.       LOS: 21 days   Betsy Coder, MD   10/06/2017, 10:58 AM

## 2017-10-06 NOTE — Progress Notes (Signed)
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  She was diagnosed with a small bowel obstruction.  Further evaluation with colonoscopy revealed cecal mass.  She has been found to have metastatic colon cancer.  Underwent exploratory laparotomy with palliative small bowel right colon bypass and placement of gastrostomy tube on 09/23/17.  Postoperatively she has developed prolonged ileus, anasarca and acute kidney injury.  Assessment & Plan:   Principal Problem:   Peritoneal carcinomatosis (Real) Active Problems:   Ascites   Protein-calorie malnutrition, severe (HCC)   Nausea & vomiting   History of seizures   Nausea and vomiting   SBO (small bowel obstruction) (HCC)   Colonic mass   Dehydration   Neoplasm of colon, malignant (HCC)   Leukocytosis   Colon cancer (HCC)   Small bowel obstruction: Secondary to cecal mass and lately  with postoperative ileus.Underwent explorative laparatomy .  Status post palliative small bowel to right colon bypass with placement of a gastrostomy tube on 1/10.  Developed severe ileus.  Surgery on board. She has been started on clear liquid diet.  Had a bowel movement yesterday.  She has been encouraged to walk and ambulate. On TPN  Metastatic colon cancer/peritoneal carcinomatosis. She was initially following with Dr. 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 Plan was for carboplatin/paclitaxel x 3 cycles followed by debulking, but it looks like cytology from paracentesis on 12/22 has come back atypical cells suspicious for adenocarcinoma of GI tractand now a biopsy of the peritoneum has come back with "adenocarcinoma with  extracellular mucin and signet ring features, most c/w GI primary. Medical oncology (Dr. Benay Spice) following.  Started on chemotherapy.  Acute kidney injury: Improved with IV fluids.  We will continue to monitor.  Left ureteral obstruction: Status post stent placement.  Leukocytosis: Most likely a malignancy.  Patient is afebrile and nontoxic.  Anemia: Likely secondary to acute illness/chronic comorbidities.  Currently H&H is stable.  We will continue to follow-up.  Anasarca/lower extremity edema: Improved with diuresis.  But currently she is on gentle hydration .  Transaminitis: Stable.  Seizure disorder: Continue IV Dilantin.  Plan is to transition to oral when she is able to tolerate more by mouth.       DVT prophylaxis: Lovenox Code Status: Full Family Communication: None at the bedside Disposition Plan: Unknown   ,Consultants: Surgery, oncology  Procedures:  CT-guided core biopsy of peritoneal tumor per Dr. Kathlene Cote 09/17/2017  Endoscopy per Dr. Cristina Gong 09/22/2016  Colonoscopy per Dr. Cristina Gong 09/22/2017  Exploratory laparotomy with palliative small bowel to right colon bypass with placement of gastrostomy tube per Dr. Marlou Starks 09/23/2017     Antimicrobials:None  Subjective:   Objective: Vitals:   10/05/17 2038 10/05/17 2337 10/06/17 0517 10/06/17 1335  BP: 107/80 120/80 113/76 120/82  Pulse: (!) 110 99 97 96  Resp: 19 20 18 18   Temp: 97.6 F (36.4 C) 98.3 F (36.8 C) 98.1 F (36.7 C) 98.3 F (36.8 C)  TempSrc: Oral Oral Oral Oral  SpO2: 98% 97% 99% 98%  Weight:  68.4 kg (150 lb 12.7 oz) 68.4 kg (150 lb 12.7 oz)   Height:  5\' 2"  (1.575 m)      Intake/Output Summary (Last 24 hours)  at 10/06/2017 1609 Last data filed at 10/06/2017 1335 Gross per 24 hour  Intake 1329.33 ml  Output 4750 ml  Net -3420.67 ml   Filed Weights   10/05/17 0610 10/05/17 2337 10/06/17 0517  Weight: 67.1 kg (148 lb) 68.4 kg (150 lb 12.7 oz) 68.4 kg (150 lb 12.7 oz)     Examination:  General exam: Generalized weakness,Not in distress,average built Respiratory system: Bilateral equal air entry, normal vesicular breath sounds, no wheezes or crackles  Cardiovascular system: S1 & S2 heard, RRR. No JVD, murmurs, rubs, gallops or clicks. No pedal edema. Gastrointestinal system: Abdomen is mildly distended.  Midline incision with staples in place.  Left upper quadrant gastrostomy tube. Central nervous system: Alert and oriented. No focal neurological deficits. Extremities: Trace edema, no clubbing ,no cyanosis, distal peripheral pulses palpable. Skin: No rashes, lesions or ulcers,no icterus ,no pallor Psychiatry: Judgement and insight appear normal. Mood & affect appropriate.     Data Reviewed: I have personally reviewed following labs and imaging studies  CBC: Recent Labs  Lab 10/02/17 0439 10/04/17 0348 10/06/17 0411  WBC 9.0 11.0* 14.1*  NEUTROABS  --  8.6*  --   HGB 9.1* 9.9* 9.5*  HCT 27.7* 30.0* 29.5*  MCV 85.2 86.2 87.3  PLT 481* 531* 657*   Basic Metabolic Panel: Recent Labs  Lab 10/01/17 0444 10/02/17 0439 10/03/17 0344 10/04/17 0348 10/06/17 0411 10/06/17 0500  NA 137 136 136 136 142  --   K 3.4* 3.1* 3.1* 3.5 3.3*  --   CL 97* 98* 98* 98* 100*  --   CO2 29 30 30 30  33*  --   GLUCOSE 114* 114* 129* 124* 119*  --   BUN 62* 59* 59* 55* 51*  --   CREATININE 1.84* 1.80* 1.75* 1.68* 1.46*  --   CALCIUM 8.0* 8.0* 8.1* 8.2* 8.3*  --   MG 2.6* 2.5* 2.5* 2.6* 2.4  --   PHOS 4.3 3.6 3.7 4.0  --  4.2   GFR: Estimated Creatinine Clearance: 35.7 mL/min (A) (by C-G formula based on SCr of 1.46 mg/dL (H)). Liver Function Tests: Recent Labs  Lab 10/01/17 0444 10/02/17 0439 10/03/17 0344 10/04/17 0348 10/06/17 0411  AST 54* 56* 72* 51* 54*  ALT 66* 66* 80* 70* 73*  ALKPHOS 246* 272* 298* 270* 251*  BILITOT 2.2* 2.2* 2.3* 1.8* 1.7*  PROT 5.9* 5.8* 5.8* 6.0* 5.9*  ALBUMIN 1.5* 1.4* 1.4* 1.6* 1.5*   No results for input(s):  LIPASE, AMYLASE in the last 168 hours. No results for input(s): AMMONIA in the last 168 hours. Coagulation Profile: No results for input(s): INR, PROTIME in the last 168 hours. Cardiac Enzymes: No results for input(s): CKTOTAL, CKMB, CKMBINDEX, TROPONINI in the last 168 hours. BNP (last 3 results) No results for input(s): PROBNP in the last 8760 hours. HbA1C: No results for input(s): HGBA1C in the last 72 hours. CBG: Recent Labs  Lab 10/06/17 0044 10/06/17 0655 10/06/17 0735 10/06/17 1207  GLUCAP 138* 116* 124* 111*   Lipid Profile: Recent Labs    10/04/17 0348 10/05/17 0309  TRIG 340* 325*   Thyroid Function Tests: No results for input(s): TSH, T4TOTAL, FREET4, T3FREE, THYROIDAB in the last 72 hours. Anemia Panel: No results for input(s): VITAMINB12, FOLATE, FERRITIN, TIBC, IRON, RETICCTPCT in the last 72 hours. Sepsis Labs: No results for input(s): PROCALCITON, LATICACIDVEN in the last 168 hours.  No results found for this or any previous visit (from the past 240 hour(s)).  Radiology Studies: No results found.      Scheduled Meds: . clonazePAM  0.25 mg Oral QHS  . enoxaparin (LOVENOX) injection  30 mg Subcutaneous Q24H  . feeding supplement  1 Container Oral TID BM  . FLUOROURACIL (ADRUCIL) CHEMO infusion For Inpatient Use  2,400 mg/m2 (Order-Specific) Intravenous Once  . flurouracil CHEMO IVPB infusion  400 mg/m2 (Order-Specific) Intravenous Once  . oxaliplatin (ELOXATIN) CHEMO IV infusion  85 mg/m2 (Order-Specific) Intravenous Once  . phenytoin (DILANTIN) IV  100 mg Intravenous QHS   Continuous Infusions: . Marland KitchenTPN (CLINIMIX-E) Adult 40 mL/hr at 10/05/17 1703  . Marland KitchenTPN (CLINIMIX-E) Adult    . sodium chloride 10 mL/hr at 10/06/17 1100     LOS: 21 days    Time spent: 25 minutes    Sarena Jezek Jodie Echevaria, MD [facility] Pager [phone]  If 7PM-7AM, please contact night-coverage [email or web address] Password [facility] 10/06/2017, 4:09 PM

## 2017-10-06 NOTE — Progress Notes (Addendum)
Patient education for oxaliplatin,  flourouracil and leucovorin was done. Pt asked questions and later verbalized understanding.

## 2017-10-06 NOTE — Progress Notes (Signed)
Dosage and calculations of flourouracil, oxaliplatin,and leucovorin verified by Laural Benes, RN

## 2017-10-06 NOTE — Progress Notes (Signed)
[place] will provide [place] fro Mrs. Roes at DC to home.  [facility] Infusion Coordinator will provide in hospital TPN teaching with the pt/husband prior to DC to support ease of transition to home with TPN.  [facility] TPN Pharmacy team will need Home TPN order stating:  TPN Per [facility] Protocol for labs and dosing , at DC to provide TPN at home.   If patient discharges after hours, please call [phone].   Larry Sierras 10/06/2017, 2:47 PM

## 2017-10-06 NOTE — Progress Notes (Signed)
Handoff Report given to Heidi, RN at 7:30 pm for transfer to [facility]. Pt and husband updated on room status. Will be transfer this pm. Night RN on [address] will transfer pt to [place], Therapist, sports

## 2017-10-06 NOTE — Progress Notes (Signed)
Patient ID: Christie Maynard, female   DOB: [birth year], 62 y.o.   MRN: [id number] 13 Days Post-Op   Subjective: No specific complaints this morning.  G-tube back on drainage.  Chemotherapy planned to start today.  Objective: Vital signs in last 24 hours: Temp:  [97.6 F (36.4 C)-98.3 F (36.8 C)] 98.1 F (36.7 C) (01/23 0517) Pulse Rate:  [97-113] 97 (01/23 0517) Resp:  [18-20] 18 (01/23 0517) BP: (107-120)/(76-84) 113/76 (01/23 0517) SpO2:  [97 %-100 %] 99 % (01/23 0517) Weight:  [68.4 kg (150 lb 12.7 oz)] 68.4 kg (150 lb 12.7 oz) (01/23 0517) Last BM Date: 10/05/17  Intake/Output from previous day: 01/22 0701 - 01/23 0700 In: 2201 [P.O.:900; I.V.:1281] Out: 3900 [Urine:800; Drains:3100] Intake/Output this shift: No intake/output data recorded.  General appearance: alert, cooperative and no distress GI: Mild to moderate distention.  Nontender. Incision/Wound: Incision clean and dry.  G-tube site without drainage or erythema  Lab Results:  Recent Labs    10/04/17 0348 10/06/17 0411  WBC 11.0* 14.1*  HGB 9.9* 9.5*  HCT 30.0* 29.5*  PLT 531* 519*   BMET Recent Labs    10/04/17 0348 10/06/17 0411  NA 136 142  K 3.5 3.3*  CL 98* 100*  CO2 30 33*  GLUCOSE 124* 119*  BUN 55* 51*  CREATININE 1.68* 1.46*  CALCIUM 8.2* 8.3*     Studies/Results: No results found.  Anti-infectives: Anti-infectives (From admission, onward)   Start     Dose/Rate Route Frequency Ordered Stop   09/25/17 1200  piperacillin-tazobactam (ZOSYN) IVPB 3.375 g  Status:  Discontinued     3.375 g 12.5 mL/hr over 240 Minutes Intravenous Every 8 hours 09/25/17 1054 09/30/17 0852   09/23/17 1249  cefoTEtan in Dextrose 5% (CEFOTAN) 2-2.08 GM-%(50ML) IVPB    Comments:  [name]   : cabinet override      09/23/17 1249 09/24/17 0059   09/23/17 1100  cefoTEtan (CEFOTAN) 2 g in dextrose 5 % 50 mL IVPB     2 g 100 mL/hr over 30 Minutes Intravenous  Once 09/22/17 1549 09/23/17 1333       Assessment/Plan: s/p Procedure(s): EXPLORATORY LAPAROTOMY WITH PALLIATIVE SMALL BOWEL TO RIGHT COLON BY PASS WITH PLACEMENT OF GASTROSTOMY TUBE Carcinomatosis with cecal primary Has not really opened up postoperatively.  After discussion with the patient and family and other caregivers we are planning to proceed with chemotherapy in the hospital.  Plan to discharge home following this with home TNA and home health.   LOS: 21 days    Edward Jolly 10/06/2017

## 2017-10-06 NOTE — Progress Notes (Signed)
PHARMACY - ADULT TOTAL PARENTERAL NUTRITION CONSULT NOTE & ANTIBIOTIC NOTE  Pharmacy Consult for TPN  Indication: bowel obstruction  Patient Measurements: Height: 5\' 2"  (157.5 cm) Weight: 150 lb 12.7 oz (68.4 kg) IBW/kg (Calculated) : 50.1 TPN AdjBW (KG): 54.7 Body mass index is 27.58 kg/m. 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 CT abdomen/pelviswith contrast shows progressing SBO. Having BMs but with 1450 ml from NGT, Surgery recommends starting TPN. Patient with significant recent weight loss but primarily d/t multiple large-volume paracentesis. Poor intake since week before Xmas, and unable to keep anything down since 12/31. Appears to be at risk for refeeding.  Significant events:  1/9 endoscopy/colonoscopy showed mass in cecum 1/10:  Exp lap with palliative small bowel to right colon bypass with placement of gastrostomy 1/14 continue g tube to drain and allow to drink for comfort, tolerating clear liquids; lipids stopped 1/15 take lytes out of TPN, decreased rate to 1/2 per C[facility] 2nd elevated LFTs.  Per [facility] notes, GI tract not functioning at this time. GI function may never return due to tumor burden, malignant SBO, with nothing left to do surgically. TPN is not indicated in this situation but has not been addressed with family. 1/19: still no return of bowel function, [facility] to try clamping G tube, still ongoing discussion of patient/family with palliative care  team 1/21: refused all doses of Boost for the past 2 days; Per Dr. Candiss Norse, continue TPN for now; trial of clamping Gtube, pt vomited after eating ice cream and peach breeze 1/23: Starting chemotherapy trial today.  Plan to advance back to goal TPN rate (previously decreased to 1/2 rate d/t LFT increase) and then cycle TPN since she will be discharged on TPN and is not getting significant enteral nutrition.   Recent Labs    10/04/17 0348 10/05/17 0309 10/06/17 0411  NA 136  --  142  K 3.5  --  3.3*  CL 98*  --  100*  CO2 30  --  33*  GLUCOSE 124*  --  119*  BUN 55*  --  51*  CREATININE 1.68*  --  1.46*  CALCIUM 8.2*  --  8.3*  PHOS 4.0  --   --   MG 2.6*  --  2.4  ALBUMIN 1.6*  --  1.5*  ALKPHOS 270*  --  251*  AST 51*  --  54*  ALT 70*  --  73*  BILITOT 1.8*  --  1.7*  TRIG 340* 325*  --  PREALBUMIN 19.1  --   --    Today, 10/06/2017:   Glucose (goal <150):  DC SSI/CBGs 1/14, blood gluc ok  Electrolytes:   K low at 3.3 (last replaced on 1/21)  Na, Mag, CorrCa 10.3, Phos WNL  Renal: SCr trending down to 1.46 (crcl~35)  LFTs: slightly elevated on admit, AST/ALT remain mildly elevated.  Tbili down 1.7, alkP down 251  TGs: elevated 180 (1/7), 465 (1/14), 340 (1/21), 325 (1/22) - not receiving lipids currently  Prealbumin: 8.4 (1.7), 6.1 (1/14), 19.1 (1/21)   NUTRITIONAL GOALS                                                                                             RD recs (09/20/17):  Kcal:  1700-1900 Protein:  80-90 grams Fluid:  >/= 1.7 L/d  Clinimix-E 5/15 at a goal rate of 75 ml/hr + 20% fat emulsion 240 ml / day to provide: 90 g/day protein, 1758 Kcal/day.  ~50% rate Clinimix 5/15 without Lipids provides 682 kcal and 48 gm protein for 40% kcal and 60% protein needs.  PLAN                                                                                 Now: KCl 40 mEq PO per MD  Today, at 1800 today:  Increase to Clinimix E 5/15 at 60 ml/hr   20% fat  emulsion was stopped Monday 1/14  2nd Trig > 400. Will continue to hold lipid today.  If TG cont to trend down and value is close to lower 200s with labs on 1/24, will consider resuming lipid  TPN to contain standard multivitamins and trace elements  IVF per MD  TPN lab panels on Mondays & Thursdays  F/u daily  Plan to advance back to goal rate and then cycle TPN prior to discharge.  Recheck LFT regularly, if increasing, may need to again decrease the TPN rate.   Gretta Arab PharmD, BCPS Pager [phone] 10/06/2017 7:41 AM

## 2017-10-07 ENCOUNTER — Other Ambulatory Visit: Payer: BLUE CROSS/BLUE SHIELD

## 2017-10-07 LAB — CBC WITH DIFFERENTIAL/PLATELET
Basophils Absolute: 0 10*3/uL (ref 0.0–0.1)
Basophils Relative: 0 %
Eosinophils Absolute: 0 10*3/uL (ref 0.0–0.7)
Eosinophils Relative: 0 %
HEMATOCRIT: 28.7 % — AB (ref 36.0–46.0)
HEMOGLOBIN: 9.2 g/dL — AB (ref 12.0–15.0)
LYMPHS ABS: 1.3 10*3/uL (ref 0.7–4.0)
LYMPHS PCT: 9 %
MCH: 28 pg (ref 26.0–34.0)
MCHC: 32.1 g/dL (ref 30.0–36.0)
MCV: 87.5 fL (ref 78.0–100.0)
MONOS PCT: 8 %
Monocytes Absolute: 1.1 10*3/uL — ABNORMAL HIGH (ref 0.1–1.0)
NEUTROS ABS: 11.4 10*3/uL — AB (ref 1.7–7.7)
NEUTROS PCT: 83 %
Platelets: 483 10*3/uL — ABNORMAL HIGH (ref 150–400)
RBC: 3.28 MIL/uL — ABNORMAL LOW (ref 3.87–5.11)
RDW: 18.1 % — ABNORMAL HIGH (ref 11.5–15.5)
WBC: 13.7 10*3/uL — ABNORMAL HIGH (ref 4.0–10.5)

## 2017-10-07 LAB — COMPREHENSIVE METABOLIC PANEL
ALK PHOS: 217 U/L — AB (ref 38–126)
ALT: 65 U/L — AB (ref 14–54)
AST: 50 U/L — AB (ref 15–41)
Albumin: 1.6 g/dL — ABNORMAL LOW (ref 3.5–5.0)
Anion gap: 9 (ref 5–15)
BUN: 49 mg/dL — AB (ref 6–20)
CO2: 34 mmol/L — AB (ref 22–32)
CREATININE: 1.38 mg/dL — AB (ref 0.44–1.00)
Calcium: 8.2 mg/dL — ABNORMAL LOW (ref 8.9–10.3)
Chloride: 95 mmol/L — ABNORMAL LOW (ref 101–111)
GFR calc Af Amer: 46 mL/min — ABNORMAL LOW (ref >60)
GFR calc non Af Amer: 40 mL/min — ABNORMAL LOW (ref >60)
Glucose, Bld: 136 mg/dL — ABNORMAL HIGH (ref 65–99)
Potassium: 3 mmol/L — ABNORMAL LOW (ref 3.5–5.1)
SODIUM: 138 mmol/L (ref 135–145)
Total Bilirubin: 1.4 mg/dL — ABNORMAL HIGH (ref 0.3–1.2)
Total Protein: 5.4 g/dL — ABNORMAL LOW (ref 6.5–8.1)

## 2017-10-07 LAB — PHOSPHORUS: Phosphorus: 4.4 mg/dL (ref 2.5–4.6)

## 2017-10-07 LAB — TRIGLYCERIDES: Triglycerides: 237 mg/dL — ABNORMAL HIGH (ref <150)

## 2017-10-07 LAB — MAGNESIUM: Magnesium: 2.3 mg/dL (ref 1.7–2.4)

## 2017-10-07 MED ORDER — FAT EMULSION 20 % IV EMUL
240.0000 mL | INTRAVENOUS | Status: AC
  Administered 2017-10-07: 240 mL via INTRAVENOUS
  Filled 2017-10-07: qty 240

## 2017-10-07 MED ORDER — POTASSIUM CHLORIDE 10 MEQ/50ML IV SOLN
10.0000 meq | INTRAVENOUS | Status: AC
  Administered 2017-10-07 (×6): 10 meq via INTRAVENOUS
  Filled 2017-10-07 (×6): qty 50

## 2017-10-07 MED ORDER — TRACE MINERALS CR-CU-MN-SE-ZN 10-1000-500-60 MCG/ML IV SOLN
INTRAVENOUS | Status: AC
  Administered 2017-10-07: 17:00:00 via INTRAVENOUS
  Filled 2017-10-07: qty 1800

## 2017-10-07 NOTE — Progress Notes (Signed)
OT Cancellation Note  Patient Details Name: Christie Maynard MRN: [id number] DOB: 06-May-1956   Cancelled Treatment:    Reason Eval/Treat Not Completed: Fatigue/lethargy limiting ability to participate  Pt had just walked back and forth to bathroom.  Pt fatigued and resting Will check back on pt at a later time Kari Baars, [place]  Payton Mccallum D 10/07/2017, 12:10 PM

## 2017-10-07 NOTE — Progress Notes (Signed)
[facility] Progress Note  14 Days Post-Op  Subjective: CC-  No complaints this morning. States that she had some abdominal pain over night but Mylanta helped. No BM or flatus today. Started chemotherapy yesterday, this should end tonight. Planning to go home tomorrow with TPN.  Objective: Vital signs in last 24 hours: Temp:  [97.9 F (36.6 C)-98.5 F (36.9 C)] 97.9 F (36.6 C) (01/24 0404) Pulse Rate:  [83-96] 90 (01/24 0404) Resp:  [16-20] 16 (01/24 0404) BP: (116-120)/(74-82) 117/79 (01/24 0404) SpO2:  [95 %-99 %] 99 % (01/24 0404) Weight:  [156 lb 12 oz (71.1 kg)] 156 lb 12 oz (71.1 kg) (01/24 0404) Last BM Date: 10/05/17  Intake/Output from previous day: 01/23 0701 - 01/24 0700 In: 600 [P.O.:600] Out: 5350 [Urine:1750; Drains:3600] Intake/Output this shift: Total I/O In: 480 [P.O.:480] Out: 200 [Urine:200]  PE: Gen: Alert, NAD, pleasant HEENT: EOM's intact, pupils equal and round Card: RRR, no M/G/R heard Pulm:effort normal JWJ:XBJYNW firm,distended,+BS, midlineincision C/D/Iwith staples intact and no erythema or drainage, G-tube to gravity Psych: A&Ox3  Skin: no rashes noted, warm and dry  Lab Results:  Recent Labs    10/06/17 0411 10/07/17 0500  WBC 14.1* 13.7*  HGB 9.5* 9.2*  HCT 29.5* 28.7*  PLT 519* 483*   BMET Recent Labs    10/06/17 0411 10/07/17 0500  NA 142 138  K 3.3* 3.0*  CL 100* 95*  CO2 33* 34*  GLUCOSE 119* 136*  BUN 51* 49*  CREATININE 1.46* 1.38*  CALCIUM 8.3* 8.2*   PT/INR No results for input(s): LABPROT, INR in the last 72 hours. CMP     Component Value Date/Time   NA 138 10/07/2017 0500   K 3.0 (L) 10/07/2017 0500   CL 95 (L) 10/07/2017 0500   CO2 34 (H) 10/07/2017 0500   GLUCOSE 136 (H) 10/07/2017 0500   BUN 49 (H) 10/07/2017 0500   CREATININE 1.38 (H) 10/07/2017 0500   CALCIUM 8.2 (L) 10/07/2017 0500   PROT 5.4 (L) 10/07/2017 0500   ALBUMIN 1.6 (L) 10/07/2017 0500   AST 50 (H) 10/07/2017  0500   ALT 65 (H) 10/07/2017 0500   ALKPHOS 217 (H) 10/07/2017 0500   BILITOT 1.4 (H) 10/07/2017 0500   GFRNONAA 40 (L) 10/07/2017 0500   GFRAA 46 (L) 10/07/2017 0500   Lipase     Component Value Date/Time   LIPASE 69 (H) 09/14/2017 1149       Studies/Results: No results found.  Anti-infectives: Anti-infectives (From admission, onward)   Start     Dose/Rate Route Frequency Ordered Stop   09/25/17 1200  piperacillin-tazobactam (ZOSYN) IVPB 3.375 g  Status:  Discontinued     3.375 g 12.5 mL/hr over 240 Minutes Intravenous Every 8 hours 09/25/17 1054 09/30/17 0852   09/23/17 1249  cefoTEtan in Dextrose 5% (CEFOTAN) 2-2.08 GM-%(50ML) IVPB    Comments:  [name]   : cabinet override      09/23/17 1249 09/24/17 0059   09/23/17 1100  cefoTEtan (CEFOTAN) 2 g in dextrose 5 % 50 mL IVPB     2 g 100 mL/hr over 30 Minutes Intravenous  Once 09/22/17 1549 09/23/17 1333       Assessment/Plan SBO/cecal mass Metastatic colon cancer, carcinomatosis S/pEXPLORATORY LAPAROTOMY WITH PALLIATIVE SMALL BOWEL TO RIGHT COLON BY PASS WITH PLACEMENT OF GASTROSTOMY TUBE1/10 Dr. Marlou Starks - POD14 - G tube to gravity and clear liquids for comfort; has not tolerated clamping trials - started chemo 1/23, planning to discharge home tomorrow  with TNA and home health  Elevated creatinine-Cr trending down 1.38, stable Elevated LFTs - slightly down today, may be 2/2 TNA  ID -zosyn 1/12>>1/16, cefotetan 1/10>>1/11 FEN -TPN@ 40 ml/hr, clear liquids, Boost VTE -lovenox Foley -none Follow up -Dr. Marlou Starks  Plan - Staples removed and steri strips applied. Likely discharging home tomorrow. Continue TPN and clear liquids for comfort, G tube to gravity. Discharge instructions on AVS and f/u appointment made with Dr. Marlou Starks. Will start working on discharge/home health planning.   LOS: 22 days    Wellington Hampshire , [facility] 10/07/2017, 10:52 AM Pager:  [phone] Consults: [phone] Mon-Fri 7:00 am-4:30 pm Sat-Sun 7:00 am-11:30 am

## 2017-10-07 NOTE — Progress Notes (Signed)
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  She was diagnosed with a small bowel obstruction.  Further evaluation with colonoscopy revealed cecal mass.  She has been found to have metastatic colon cancer.  Underwent exploratory laparotomy with palliative small bowel right colon bypass and placement of gastrostomy tube on 09/23/17.  Postoperatively she has developed prolonged ileus, anasarca and acute kidney injury.  Assessment & Plan:   Principal Problem:   Peritoneal carcinomatosis ([place]) Active Problems:   Ascites   Protein-calorie malnutrition, severe (HCC)   Nausea & vomiting   History of seizures   Nausea and vomiting   SBO (small bowel obstruction) (HCC)   Colonic mass   Dehydration   Neoplasm of colon, malignant (HCC)   Leukocytosis   Colon cancer (H[facility])   Small bowel obstruction: Secondary to cecal mass and lately  with postoperative ileus.Underwent explorative laparatomy .  Status post palliative small bowel to right colon bypass with placement of a gastrostomy tube on 1/10.  Developed severe ileus.  Surgery on board. She has been started on clear liquid diet. But she has not tolerated clamping of G tube clamping .   Likely she would be discharged on TPN.  She will follow-up with surgery,Dr Marlou Starks on discharge. She has been encouraged to walk and ambulate. Continue TPN Staples removed by surgery today No bowel movement or passage of flatus today  Metastatic colon cancer/peritoneal carcinomatosis. She was initially following with Dr. 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 Medical oncology (Dr. Benay Spice) following.  Started on chemotherapy.  Acute kidney injury: Improved with IV fluids.  We will continue to monitor.  Left ureteral obstruction: Status post stent placement.  Leukocytosis: Most likely secondary to malignancy.  Patient is afebrile and nontoxic.  Status is improving  Anemia: Likely secondary to acute illness/chronic comorbidities.  Currently H&H is stable.  We will continue to follow-up.  Anasarca/lower extremity edema: Improved with diuresis.  But currently she is on gentle hydration .  Transaminitis: Stable.  Seizure disorder: Continue IV Dilantin.  Plan was to transition to oral when she is able to tolerate more by mouth.But looks like she will be discharged on IV Dilantin through a PICC line.  She cannot tolerate anything solid by mouth, not even  Meds.  Hypokalemia: Supplemented with potassium today       DVT prophylaxis: Lovenox Code Status: Full Family Communication: Family members present at the bedside  disposition Plan: Likely Tomorrow with advanced home health  ,Consultants: Surgery, oncology  Procedures:  CT-guided core biopsy of peritoneal tumor per Dr. Kathlene Cote 09/17/2017  Endoscopy per Dr. Cristina Gong 09/22/2016  Colonoscopy per Dr. Cristina Gong 09/22/2017  Exploratory laparotomy with palliative small bowel to right colon bypass with placement of gastrostomy tube per Dr. Marlou Starks 09/23/2017     Antimicrobials:None  Subjective: Patient seen and examined the bedside this morning.  Remains comfortable.  She does not have any bowel  movement or passes of flatus today.  General surgery removed the staples.  She does not complain of any abdomen pain. Gastrostomy tube draining  dark colored material.  Objective: Vitals:   10/06/17 1335 10/06/17 1840 10/06/17 2038 10/07/17 0404  BP: 120/82 118/78 116/74 117/79  Pulse: 96 95 83 90  Resp: 18 20 18 16   Temp: 98.3 F (36.8 C) 98.5 F (36.9 C) 98.5 F (36.9 C) 97.9 F (36.6 C)  TempSrc: Oral Oral Oral Oral  SpO2: 98% 98% 95% 99%  Weight:    71.1 kg (156 lb 12 oz)  Height:        Intake/Output Summary (Last 24 hours) at 10/07/2017 1334 Last data filed at 10/07/2017 0804 Gross per 24 hour  Intake 840 ml  Output 3500 ml  Net -2660 ml   Filed Weights   10/05/17 2337 10/06/17 0517 10/07/17 0404  Weight: 68.4 kg (150 lb 12.7 oz) 68.4 kg (150 lb 12.7 oz) 71.1 kg (156 lb 12 oz)    Examination:  General exam: Appears calm and comfortable ,Not in distress,average built Respiratory system: Bilateral equal air entry, normal vesicular breath sounds, no wheezes or crackles  Cardiovascular system: S1 & S2 heard, RRR. No JVD, murmurs, rubs, gallops or clicks. No pedal edema. Gastrointestinal system: Abdomen is distended, soft and nontender. No organomegaly or masses felt.  Sluggish bowel sounds.  G-tube present.  Midline surgical incision. Central nervous system: Alert and oriented. No focal neurological deficits. Extremities: No edema, no clubbing ,no cyanosis, distal peripheral pulses palpable. Skin: No cyanosis,No pallor,No Rash,No Ulcer Psychiatry: Judgement and insight appear normal. Mood & affect appropriate.     Data Reviewed: I have personally reviewed following labs and imaging studies  CBC: Recent Labs  Lab 10/02/17 0439 10/04/17 0348 10/06/17 0411 10/07/17 0500  WBC 9.0 11.0* 14.1* 13.7*  NEUTROABS  --  8.6*  --  11.4*  HGB 9.1* 9.9* 9.5* 9.2*  HCT 27.7* 30.0* 29.5* 28.7*  MCV 85.2 86.2 87.3 87.5  PLT 481* 531* 519* 656*   Basic Metabolic Panel: Recent Labs  Lab 10/02/17 0439 10/03/17 0344 10/04/17 0348 10/06/17 0411 10/06/17 0500 10/07/17 0500  NA 136 136 136 142  --  138  K 3.1*  3.1* 3.5 3.3*  --  3.0*  CL 98* 98* 98* 100*  --  95*  CO2 30 30 30  33*  --  34*  GLUCOSE 114* 129* 124* 119*  --  136*  BUN 59* 59* 55* 51*  --  49*  CREATININE 1.80* 1.75* 1.68* 1.46*  --  1.38*  CALCIUM 8.0* 8.1* 8.2* 8.3*  --  8.2*  MG 2.5* 2.5* 2.6* 2.4  --  2.3  PHOS 3.6 3.7 4.0  --  4.2 4.4   GFR: Estimated Creatinine Clearance: 38.5 mL/min (A) (by C-G formula based on SCr of 1.38 mg/dL (H)). Liver Function Tests: Recent Labs  Lab 10/02/17 0439 10/03/17 0344 10/04/17 0348 10/06/17 0411 10/07/17 0500  AST 56* 72* 51* 54* 50*  ALT 66* 80* 70* 73* 65*  ALKPHOS 272* 298* 270* 251* 217*  BILITOT 2.2* 2.3* 1.8* 1.7* 1.4*  PROT 5.8* 5.8* 6.0* 5.9* 5.4*  ALBUMIN 1.4* 1.4* 1.6* 1.5* 1.6*   No results for input(s): LIPASE, AMYLASE in the last 168 hours. No results for input(s): AMMONIA in the last 168 hours. Coagulation Profile: No results for input(s): INR, PROTIME in the last 168 hours. Cardiac Enzymes: No results for input(s): CKTOTAL, CKMB, CKMBINDEX, TROPONINI in the last 168 hours. BNP (  last 3 results) No results for input(s): PROBNP in the last 8760 hours. HbA1C: No results for input(s): HGBA1C in the last 72 hours. CBG: Recent Labs  Lab 10/06/17 0044 10/06/17 0655 10/06/17 0735 10/06/17 1207 10/06/17 1626  GLUCAP 138* 116* 124* 111* 164*   Lipid Profile: Recent Labs    10/05/17 0309 10/07/17 0500  TRIG 325* 237*   Thyroid Function Tests: No results for input(s): TSH, T4TOTAL, FREET4, T3FREE, THYROIDAB in the last 72 hours. Anemia Panel: No results for input(s): VITAMINB12, FOLATE, FERRITIN, TIBC, IRON, RETICCTPCT in the last 72 hours. Sepsis Labs: No results for input(s): PROCALCITON, LATICACIDVEN in the last 168 hours.  No results found for this or any previous visit (from the past 240 hour(s)).       Radiology Studies: No results found.      Scheduled Meds: . clonazePAM  0.25 mg Oral QHS  . enoxaparin (LOVENOX) injection  30 mg  Subcutaneous Q24H  . feeding supplement  1 Container Oral TID BM  . FLUOROURACIL (ADRUCIL) CHEMO infusion For Inpatient Use  2,400 mg/m2 (Order-Specific) Intravenous Once  . phenytoin (DILANTIN) IV  100 mg Intravenous QHS   Continuous Infusions: . Marland KitchenTPN (CLINIMIX-E) Adult 60 mL/hr at 10/06/17 1717  . sodium chloride 10 mL/hr at 10/06/17 1100  . Marland KitchenTPN (CLINIMIX-E) Adult     And  . fat emulsion    . potassium chloride Stopped (10/07/17 1159)     LOS: 22 days    Time spent: 25 minutes    Aarav Burgett Jodie Echevaria, MD [facility] Pager [phone]  If 7PM-7AM, please contact night-coverage [email or web address] Password [email or web address] 10/07/2017, 1:34 PM

## 2017-10-07 NOTE — Progress Notes (Signed)
IP PROGRESS NOTE  Subjective:   Christie Maynard began FOLFOX chemotherapy yesterday.  She denies nausea and neuropathy symptoms.  She reports no bowel movement or flatus.  She had an episode of right lower abdominal pain last night, relieved with Maalox and morphine Objective: Vital signs in last 24 hours: Blood pressure 117/79, pulse 90, temperature 97.9 F (36.6 C), temperature source Oral, resp. rate 16, height 5' 2"  (1.575 m), weight 156 lb 12 oz (71.1 kg), SpO2 99 %.  Intake/Output from previous day: 01/23 0701 - 01/24 0700 In: 600 [P.O.:600] Out: 5350 [Urine:1750; Drains:3600]  Physical Exam:  HEENT: No thrush Lungs: Clear anteriorly, no respiratory distress Cardiac: Regular rate and rhythm Abdomen: Distended, midline incision with staples in place, left upper quadrant gastrostomy tube site with a gauze dressing, nontender Vascular: Trace pitting edema at the feet     Lab Results: Recent Labs    10/06/17 0411 10/07/17 0500  WBC 14.1* 13.7*  HGB 9.5* 9.2*  HCT 29.5* 28.7*  PLT 519* 483*    BMET Recent Labs    10/06/17 0411 10/07/17 0500  NA 142 138  K 3.3* 3.0*  CL 100* 95*  CO2 33* 34*  GLUCOSE 119* 136*  BUN 51* 49*  CREATININE 1.46* 1.38*  CALCIUM 8.3* 8.2*    Lab Results  Component Value Date   CEA1 2,751.0 (H) 09/16/2017    Medications: I have reviewed the patient's current medications.  Assessment/Plan: 1. Metastatic colon cancer  Initial presentation with nausea and bloating.   CT abdomen/pelvis 09/03/2017 showed a multilobulated irregular heterogeneous enhancing soft tissue mass within the pelvis either involving both ovaries or a single mass arising from the right and extending across the midline with components measuring 10.6 x 4.9 x 9.8 cm and 8.2 x 4.9 x 4.8 cm.  There was associated ascites and omental caking consistent with peritoneal carcinomatosis.  A distal small bowel obstruction was suspected.  There was distal left ureteral  obstruction with left hydronephrosis and hydroureter.    Ultrasound paracentesis was performed 09/04/2017.  Atypical cells were present, suspicious for adenocarcinoma of the GI tract.   Paracentesis 09/13/2017 with atypical cells present.  CT 09/14/2017 showed a progressing small bowel obstruction with diffusely dilated fluid-filled small bowel.  Transition zone in the right lower quadrant.  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  Right lower quadrant mass measuring 4.4 cm.    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  Nonspecific staining for PAX-8.  Negative for estrogen receptor, progesterone receptor and cytokeratin 7.  Colonoscopy 09/22/2017-cecum mass, biopsy confirmed adenocarcinoma with signet cell features  Exploratory laparotomy 09/23/2017- fixed pelvic and cecum masses, carcinomatosis with tumor implants at the abdominal wall, bowel, and omentum, status post a palliative small bowel to right colon bypass and placement of a gastrostomy tube  Cycle 1 FOLFOX 10/06/2017 2. Small bowel obstruction secondary to carcinomatosis and a right cecum tumor, status post palliative small bowel to right colon bypass and placement of gastrostomy tube 09/23/2017 3. Distal left ureteral obstruction with left hydronephrosis and hydroureter status post left ureteral stent placement 09/05/2017. 4. Seizure 1983. 5. Significant family history for breast cancer. 6. Renal failure-ultrasound 10/01/2017, mild right and moderate left hydronephrosis   Christie Maynard tolerated the first day of  FOLFOX well.  She will complete the 5-FU infusion 10/08/2017.  The right lower abdominal pain is likely related to tumor  pain.  She will be stable for discharge from an oncology standpoint once the 5-FU infusion is completed 10/08/2017.  Outpatient follow-up will be arranged at the Cancer center for an office visit and cycle 2 FOLFOX on 10/20/2017.    Recommendations:  1.  Continue cycle 1 FOLFOX 2.  Continue home TPN at discharge, arrange for cycling the TPN at night 3.  Continue morphine concentrate for pain 4.  Follow-up at the Cancer center for an office visit and cycle 2 FOLFOX on 10/20/2017       LOS: 25 days   Betsy Coder, MD   10/07/2017, 7:49 AM

## 2017-10-07 NOTE — Discharge Instructions (Signed)
[facility]      [facility] °[phone] ° °OPEN ABDOMINAL SURGERY: POST OP INSTRUCTIONS ° °Always review your discharge instruction sheet given to you by the facility where your surgery was performed. ° °IF YOU HAVE DISABILITY OR FAMILY LEAVE FORMS, YOU MUST BRING THEM TO THE OFFICE FOR PROCESSING.  PLEASE DO NOT GIVE THEM TO YOUR DOCTOR. ° °1. A prescription for pain medication may be given to you upon discharge.  Take your pain medication as prescribed, if needed.  If narcotic pain medicine is not needed, then you may take acetaminophen (Tylenol) or ibuprofen (Advil) as needed. °2. Take your usually prescribed medications unless otherwise directed. °3. If you need a refill on your pain medication, please contact your pharmacy. They will contact our office to request authorization.  Prescriptions will not be filled after 5pm or on week-ends. °4. You should follow a light diet the first few days after arrival home, such as soup and crackers, pudding, etc.unless your doctor has advised otherwise. A high-fiber, low fat diet can be resumed as tolerated.   Be sure to include lots of fluids daily. Most patients will experience some swelling and bruising on the chest and neck area.  Ice packs will help.  Swelling and bruising can take several days to resolve °5. Most patients will experience some swelling and bruising in the area of the incision. Ice pack will help. Swelling and bruising can take several days to resolve..  °6. It is common to experience some constipation if taking pain medication after surgery.  Increasing fluid intake and taking a stool softener will usually help or prevent this problem from occurring.  A mild laxative (Milk of Magnesia or Miralax) should be taken according to package directions if there are no bowel movements after 48 hours. °7.  You may have steri-strips (small skin tapes) in place directly over the incision.  These strips should be left on the skin for 7-10 days.  If your  surgeon used skin glue on the incision, you may shower in 24 hours.  The glue will flake off over the next 2-3 weeks.  Any sutures or staples will be removed at the office during your follow-up visit. You may find that a light gauze bandage over your incision may keep your staples from being rubbed or pulled. You may shower and replace the bandage daily. °8. ACTIVITIES:  You may resume regular (light) daily activities beginning the next day--such as daily self-care, walking, climbing stairs--gradually increasing activities as tolerated.  You may have sexual intercourse when it is comfortable.  Refrain from any heavy lifting or straining until approved by your doctor. °a. You may drive when you no longer are taking prescription pain medication, you can comfortably wear a seatbelt, and you can safely maneuver your car and apply brakes °b. Return to Work: ___________________________________ °9. You should see your doctor in the office for a follow-up appointment approximately two weeks after your surgery.  Make sure that you call for this appointment within a day or two after you arrive home to insure a convenient appointment time. °OTHER INSTRUCTIONS:  °_____________________________________________________________ °_____________________________________________________________ ° °WHEN TO CALL YOUR DOCTOR: °1. Fever over 101.0 °2. Inability to urinate °3. Nausea and/or vomiting °4. Extreme swelling or bruising °5. Continued bleeding from incision. °6. Increased pain, redness, or drainage from the incision. °7. Difficulty swallowing or breathing °8. Muscle cramping or spasms. °9. Numbness or tingling in hands or feet or around lips. ° °The clinic staff is available to   answer your questions during regular business hours.  Please dont hesitate to call and ask to speak to one of the nurses if you have concerns.  For further questions, please visit [email or web address]    Gastrostomy Tube Home Guide,  Adult A gastrostomy tube is a tube that is surgically placed into the stomach. It is also called a G-tube. G-tubes are used when a person is unable to eat and drink enough on their own to stay healthy. The tube is inserted into the stomach through a small cut (incision) in the skin. This tube is used for:  Feeding.  Giving medication.  Gastrostomy tube care  Wash your hands with soap and water.  Remove the old dressing (if any). Some styles of G-tubes may need a dressing inserted between the skin and the G-tube. Other types of G-tubes do not require a dressing. Ask your health care provider if a dressing is needed.  Check the area where the tube enters the skin (insertion site) for redness, swelling, or pus-like (purulent) drainage. A small amount of clear or tan liquid drainage is normal. Check to make sure scar tissue (skin) is not growing around the insertion site. This could have a raised, bumpy appearance.  A cotton swab can be used to clean the skin around the tube: ? When the G-tube is first put in, a normal saline solution or water can be used to clean the skin. ? Mild soap and warm water can be used when the skin around the G-tube site has healed. ? Roll the cotton swab around the G-tube insertion site to remove any drainage or crusting at the insertion site. Stomach residuals Feeding tube residuals are the amount of liquids that are in the stomach at any given time. Residuals may be checked before giving feedings, medications, or as instructed by your health care provider.  Ask your health care provider if there are instances when you would not start tube feedings depending on the amount or type of contents withdrawn from the stomach.  Check residuals by attaching a syringe to the G-tube and pulling back on the syringe plunger. Note the amount, and return the residual back into the stomach.  Flushing the G-tube  The G-tube should be periodically flushed with clean warm water  to keep it from clogging. ? Flush the G-tube after feedings or medications. Draw up 30 mL of warm water in a syringe. Connect the syringe to the G-tube and slowly push the water into the tube. ? Do not push feedings, medications, or flushes rapidly. Flush the G-tube gently and slowly. ? Only use syringes made for G-tubes to flush medications or feedings. ? Your health care provider may want the G-tube flushed more often or with more water. If this is the case, follow your health care provider's instructions. Feedings Your health care provider will determine whether feedings are given as a bolus (a certain amount given at one time and at scheduled times) or whether feedings will be given continuously on a feeding pump.  Formulas should be given at room temperature.  If feedings are continuous, no more than 4 hours worth of feedings should be placed in the feeding bag. This helps prevent spoilage or accidental excess infusion.  Cover and place unused formula in the refrigerator.  If feedings are continuous, stop the feedings when medications or flushes are given. Be sure to restart the feedings.  Feeding bags and syringes should be replaced as instructed by your health care provider.  Giving medication  In general, it is best if all medications are in a liquid form for G-tube administration. Liquid medications are less likely to clog the G-tube. ? Mix the liquid medication with 30 mL (or amount recommended by your health care provider) of warm water. ? Draw up the medication into the syringe. ? Attach the syringe to the G-tube and slowly push the mixture into the G-tube. ? After giving the medication, draw up 30 mL of warm water in the syringe and slowly flush the G-tube.  For pills or capsules, check with your health care provider first before crushing medications. Some pills are not effective if they are crushed. Some capsules are sustained-release medications. ? If appropriate, crush the  pill or capsule and mix with 30 mL of warm water. Using the syringe, slowly push the medication through the tube, then flush the tube with another 30 mL of tap water. G-tube problems G-tube was pulled out.  Cause: May have been pulled out accidentally.  Solutions: Cover the opening with clean dressing and tape. Call your health care provider right away. The G-tube should be put in as soon as possible (within 4 hours) so the G-tube opening (tract) does not close. The G-tube needs to be put in at a health care setting. An X-ray needs to be done to confirm placement before the G-tube can be used again.  Redness, irritation, soreness, or foul odor around the gastrostomy site.  Cause: May be caused by leakage or infection.  Solutions: Call your health care provider right away.  Large amount of leakage of fluid or mucus-like liquid present (a large amount means it soaks clothing).  Cause: Many reasons could cause the G-tube to leak.  Solutions: Call your health care provider to discuss the amount of leakage.  Skin or scar tissue appears to be growing where tube enters skin.  Cause: Tissue growth may develop around the insertion site if the G-tube is moved or pulled on excessively.  Solutions: Secure tube with tape so that excess movement does not occur. Call your health care provider.  G-tube is clogged.  Cause: Thick formula or medication.  Solutions: Try to slowly push warm water into the tube with a large syringe. Never try to push any object into the tube to unclog it. Do not force fluid into the G-tube. If you are unable to unclog the tube, call your health care provider right away.  Tips  Head of bed (HOB) position refers to the upright position of a person's upper body. ? When giving medications or a feeding bolus, keep the [facility] up as told by your health care provider. Do this during the feeding and for 1 hour after the feeding or medication administration. ? If continuous  feedings are being given, it is best to keep the [facility] up as told by your health care provider. When ADLs (activities of daily living) are performed and the [facility] needs to be flat, be sure to turn the feeding pump off. Restart the feeding pump when the [facility] is returned to the recommended height.  Do not pull or put tension on the tube.  To prevent fluid backflow, kink the G-tube before removing the cap or disconnecting a syringe.  Check the G-tube length every day. Measure from the insertion site to the end of the G-tube. If the length is longer than previous measurements, the tube may be coming out. Call your health care provider if you notice increasing G-tube length.  Oral care, such as brushing teeth,  must be continued.  You may need to remove excess air (vent) from the G-tube. Your health care provider will tell you if this is needed.  Always call your health care provider if you have questions or problems with the G-tube. Get help right away if:  You have severe abdominal pain, tenderness, or abdominal bloating (distension).  You have nausea or vomiting.  You are constipated or have problems moving your bowels.  The G-tube insertion site is red, swollen, has a foul smell, or has yellow or brown drainage.  You have difficulty breathing or shortness of breath.  You have a fever.  You have a large amount of feeding tube residuals.  The G-tube is clogged and cannot be flushed. This information is not intended to replace advice given to you by your health care provider. Make sure you discuss any questions you have with your health care provider. Document Released: 11/09/2001 Document Revised: 02/06/2016 Document Reviewed: 05/08/2013 Elsevier Interactive Patient Education  2017 [employer].

## 2017-10-07 NOTE — Progress Notes (Signed)
PHARMACY - ADULT TOTAL PARENTERAL NUTRITION CONSULT NOTE & ANTIBIOTIC NOTE  Pharmacy Consult for TPN  Indication: bowel obstruction  Patient Measurements: Height: 5\' 2"  (157.5 cm) Weight: 156 lb 12 oz (71.1 kg) IBW/kg (Calculated) : 50.1 TPN AdjBW (KG): 54.7 Body mass index is 28.67 kg/m. 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 CT abdomen/pelviswith contrast shows progressing SBO. Having BMs but with 1450 ml from NGT, Surgery recommends starting TPN. Patient with significant recent weight loss but primarily d/t multiple large-volume paracentesis. Poor intake since week before Xmas, and unable to keep anything down since 12/31. Appears to be at risk for refeeding.  Significant events:  1/9 endoscopy/colonoscopy showed mass in cecum 1/10:  Exp lap with palliative small bowel to right colon bypass with placement of gastrostomy 1/14 continue g tube to drain, tolerating clear liquids; lipids stopped d/t elevated triglycerides > 400. 1/15 take lytes out of TPN, decreased rate to 1/2 per [facility] 2nd elevated LFTs.  Per [facility] notes, GI tract not functioning at this time. GI function may never return due to tumor burden, malignant SBO, with nothing left to do surgically. TPN is not indicated in this situation but has not been addressed with family. 1/19: still no return of bowel function, [facility] to try clamping G tube, still ongoing discussion of patient/family with palliative care  team 1/21: refused all doses of Boost for the past 2 days; Per Dr. Candiss Norse, continue TPN for now; trial of clamping Gtube, pt vomited after eating ice cream and peach breeze 1/23: Starting chemotherapy trial today.  Plan to advance back to goal TPN rate (previously decreased to 1/2 rate d/t LFT increase) and then cycle TPN since she will be discharged on TPN and is not getting significant enteral nutrition. 1/24 Resume lipids, increase to goal rate.   Recent Labs    10/05/17 0309 10/06/17 0411 10/06/17 0500 10/07/17 0500  NA  --  142  --  138  K  --  3.3*  --  3.0*  CL  --  100*  --  95*  CO2  --  33*  --  34*  GLUCOSE  --  119*  --  136*  BUN  --  51*  --  49*  CREATININE  --  1.46*  --  1.38*  CALCIUM  --  8.3*  --  8.2*  PHOS  --   --  4.2 4.4  MG  --  2.4  --  2.3  ALBUMIN  --  1.5*  --  1.6*  ALKPHOS  --  251*  --  217*  AST  --  54*  --  50*  ALT  --  73*  --  65*  BILITOT  --  1.7*  --  1.4*  TRIG 325*  --   --  237*   Today, 10/07/2017:   Glucose (goal <150):  DC SSI/CBGs 1/14, blood glucose within goal  Electrolytes:   K low and decreased to 3, despite oral replacement   Na, Mag, CorrCa 10.3, Phos WNL.  Phos appears to be trending up - monitor closely and remove elytes from TPN if elevated.  Renal: SCr trending down to 1.38   LFTs: slightly elevated on admit, AST/ALT now remain mildly elevated but fairly stable.  Tbili down to 1.4, alkP down to 217  TGs: elevated 180 (1/7), 465 (1/14), 340 (1/21), 325 (1/22), 237 (1/24) - not receiving lipids currently  Prealbumin: 8.4 (1.7), 6.1 (1/14), 19.1 (1/21)   NUTRITIONAL GOALS                                                                                             RD recs (09/20/17):  Kcal:  1700-1900 Protein:  80-90 grams Fluid:  >/= 1.7 L/d  Clinimix-E 5/15 at a goal rate of 75 ml/hr + 20% fat emulsion 240 ml / day to provide: 90 g/day protein, 1758 Kcal/day.  Clinimix-E 5/15 at a goal rate of 75 ml/hr + 20%  fat emulsion 240 ml / day only 3 times per week to provide a daily average of 90 g/day protein, 1484 Kcal/day.  Meeting 100% of protein goals and 87% of kcal goals.  If needing to increase total Kcal while on 3 times per week lipids:  Clinimix-E 5/20 at a goal rate of 75 ml/hr + 20% fat emulsion 240 ml / day only 3 times per week to provide a daily average of 90 g/day protein, 1789 Kcal/day.  Meeting 100% of protein goals and 100% of kcal goals.  PLAN                                                                                 Now: KCl 10 mEq IV x 6 runs  Today, at 1800 today:  Increase to Clinimix E 5/15 at 75 ml/hr   Monitor elytes closely; replace K as needed but remove from TPN if hyperphosphatemia occurs again.  Resume lipids, 20% fat emulsion at 20 ml/hr for 12 hours.  Plan to give lipids only 3 times per week until sure that TG will remain stable.  Give lipids this week on Thursday and Friday, then proceed with Mon/Wed/Friday lipids starting next week.  Recheck Triglycerides next on Sunday, 1/27 and resume holding if >400.    TPN to contain standard multivitamins and trace elements  IVF per MD  TPN lab panels on Mondays & Thursdays  F/u daily  Plan to advance back to goal rate and then cycle TPN prior to discharge with home TPN.  Recheck LFT regularly and if increasing, may need to again decrease the TPN rate.    Will try to continue using Clinimix 5/15 instead of 5/20 to reduce glucose load.  Plan to cycle the TPN, ASAP, starting on 1/25 if labs remain stable to prevent further hepatic complications.     Gretta Arab PharmD, BCPS Pager [phone] 10/07/2017 7:29 AM

## 2017-10-07 NOTE — Progress Notes (Signed)
Nutrition Follow-up  INTERVENTION:   TPN per Pharmacy Continue Boost Breeze po TID, each supplement provides 250 kcal and 9 grams of protein  NUTRITION DIAGNOSIS:   Increased nutrient needs related to chronic illness, cancer and cancer related treatments, catabolic illness as evidenced by estimated needs.  Ongoing.  GOAL:   Patient will meet greater than or equal to 90% of their needs  Progressing.  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   Events: 1/9- endo/colonoscopy showed cecal mass 1/10- exploratory laparotomy with palliative small bowel to right colon bypass, placement of G-tube 1/11- advanced to clearsfor comfort 1/15- TPN rate cut in half due to elevated LFTs. GI tract not functioning.  1/20: G-tube clamped, pt then developed N/V. Not drinking Boost Breeze 1/23: Began chemotherapy. G-tube set to drainage.  TPN advancing to goal rate of Clinimix E 5/15 @ 75 ml/hr. Lipids resumed 20% ILE @ 20 ml/hr x 12 hours for 3 days a week.  Per surgery note, pt to discharge home tomorrow on TPN. NGT output: 550 ml. Pt on clear liquids for comfort only.  Medications reviewed. Labs reviewed: CBGs: 111-164 Low K Mg/Phos WNL  Diet Order:  Diet clear liquid Room service appropriate? Yes; Fluid consistency: Thin .TPN (CLINIMIX-E) Adult TPN (CLINIMIX-E) Adult  EDUCATION NEEDS:   Not appropriate for education at this time  Skin:  Skin Assessment: Reviewed RN Assessment  Last BM:  10/04/17- mucus stool  Height:   Ht Readings from Last 1 Encounters:  10/05/17 5\' 2"  (1.575 m)    Weight:   Wt Readings from Last 1 Encounters:  10/07/17 156 lb 12 oz (71.1 kg)    Ideal Body Weight:  50 kg  BMI:  Body  mass index is 28.67 kg/m.  Estimated Nutritional Needs:   Kcal:  1700-1900  Protein:  80-90 grams  Fluid:  >/= 1.7 L/d  Clayton Bibles, MS, RD, LDN [place] [facility] Pager: [phone] After Hours Pager: [phone]

## 2017-10-08 LAB — BASIC METABOLIC PANEL
ANION GAP: 10 (ref 5–15)
BUN: 55 mg/dL — ABNORMAL HIGH (ref 6–20)
CHLORIDE: 96 mmol/L — AB (ref 101–111)
CO2: 32 mmol/L (ref 22–32)
Calcium: 8.2 mg/dL — ABNORMAL LOW (ref 8.9–10.3)
Creatinine, Ser: 1.41 mg/dL — ABNORMAL HIGH (ref 0.44–1.00)
GFR calc non Af Amer: 39 mL/min — ABNORMAL LOW (ref >60)
GFR, EST AFRICAN AMERICAN: 45 mL/min — AB (ref >60)
GLUCOSE: 145 mg/dL — AB (ref 65–99)
Potassium: 3.3 mmol/L — ABNORMAL LOW (ref 3.5–5.1)
Sodium: 138 mmol/L (ref 135–145)

## 2017-10-08 LAB — CBC WITH DIFFERENTIAL/PLATELET
BASOS ABS: 0 10*3/uL (ref 0.0–0.1)
Basophils Relative: 0 %
Eosinophils Absolute: 0 10*3/uL (ref 0.0–0.7)
Eosinophils Relative: 0 %
HCT: 29.5 % — ABNORMAL LOW (ref 36.0–46.0)
HEMOGLOBIN: 9.6 g/dL — AB (ref 12.0–15.0)
LYMPHS ABS: 1 10*3/uL (ref 0.7–4.0)
LYMPHS PCT: 9 %
MCH: 28.6 pg (ref 26.0–34.0)
MCHC: 32.5 g/dL (ref 30.0–36.0)
MCV: 87.8 fL (ref 78.0–100.0)
Monocytes Absolute: 0.4 10*3/uL (ref 0.1–1.0)
Monocytes Relative: 3 %
NEUTROS ABS: 10.2 10*3/uL — AB (ref 1.7–7.7)
Neutrophils Relative %: 88 %
PLATELETS: 451 10*3/uL — AB (ref 150–400)
RBC: 3.36 MIL/uL — ABNORMAL LOW (ref 3.87–5.11)
RDW: 18.7 % — ABNORMAL HIGH (ref 11.5–15.5)
WBC: 11.6 10*3/uL — AB (ref 4.0–10.5)

## 2017-10-08 LAB — MAGNESIUM: Magnesium: 2.5 mg/dL — ABNORMAL HIGH (ref 1.7–2.4)

## 2017-10-08 LAB — PHOSPHORUS: PHOSPHORUS: 4.9 mg/dL — AB (ref 2.5–4.6)

## 2017-10-08 MED ORDER — POTASSIUM CHLORIDE 10 MEQ/50ML IV SOLN
10.0000 meq | INTRAVENOUS | Status: AC
  Administered 2017-10-08 (×4): 10 meq via INTRAVENOUS
  Filled 2017-10-08 (×4): qty 50

## 2017-10-08 MED ORDER — PHENYTOIN SODIUM 50 MG/ML IJ SOLN: 100.0000 mg | Freq: Every day | INTRAMUSCULAR | 0 refills | Status: AC

## 2017-10-08 MED ORDER — PHENYTOIN SODIUM 50 MG/ML IJ SOLN: 100.0000 mg | Freq: Every day | INTRAMUSCULAR | 0 refills | Status: DC

## 2017-10-08 NOTE — Progress Notes (Signed)
PHARMACY - ADULT TOTAL PARENTERAL NUTRITION CONSULT NOTE & ANTIBIOTIC NOTE  Pharmacy Consult for TPN  Indication: bowel obstruction  Patient Measurements: Height: 5\' 2"  (157.5 cm) Weight: 157 lb 10.1 oz (71.5 kg) IBW/kg (Calculated) : 50.1 TPN AdjBW (KG): 54.7 Body mass index is 28.83 kg/m. 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 CT abdomen/pelviswith contrast shows progressing SBO. Having BMs but with 1450 ml from NGT, Surgery recommends starting TPN. Patient with significant recent weight loss but primarily d/t multiple large-volume paracentesis. Poor intake since week before Xmas, and unable to keep anything down since 12/31. Appears to be at risk for refeeding.  Significant events:  1/9 endoscopy/colonoscopy showed mass in cecum 1/10:  Exp lap with palliative small bowel to right colon bypass with placement of gastrostomy 1/14 continue g tube to drain, tolerating clear liquids; lipids stopped d/t elevated triglycerides > 400. 1/15 take lytes out of TPN, decreased rate to 1/2 per [facility] 2nd elevated LFTs.  Per [facility] notes, GI tract not functioning at this time. GI function may never return due to tumor burden, malignant SBO, with nothing left to do surgically. TPN is not indicated in this situation but has not been addressed with family. 1/19: still no return of bowel function, [facility] to try clamping G tube, still ongoing discussion of patient/family with palliative  care team 1/21: refused all doses of Boost for the past 2 days; Per Dr. Candiss Norse, continue TPN for now; trial of clamping Gtube, pt vomited after eating ice cream and peach breeze 1/23: Starting chemotherapy trial today.  Plan to advance back to goal TPN rate (previously decreased to 1/2 rate d/t LFT increase) and then cycle TPN since she will be discharged on TPN and is not getting significant enteral nutrition. 1/24 Resume lipids, increase to goal rate.   Recent Labs    10/06/17 0411  10/07/17 0500 10/08/17 0500  NA 142  --  138 138  K 3.3*  --  3.0* 3.3*  CL 100*  --  95* 96*  CO2 33*  --  34* 32  GLUCOSE 119*  --  136* 145*  BUN 51*  --  49* 55*  CREATININE 1.46*  --  1.38* 1.41*  CALCIUM 8.3*  --  8.2* 8.2*  PHOS  --    < > 4.4 4.9*  MG 2.4  --  2.3 2.5*  ALBUMIN 1.5*  --  1.6*  --   ALKPHOS 251*  --  217*  --   AST 54*  --  50*  --   ALT 73*  --  65*  --   BILITOT 1.7*  --  1.4*  --   TRIG  --   --  237*  --    < > = values in this interval not displayed.   Today, 10/08/2017:   Glucose (goal <150):  DC SSI/CBGs 1/14, blood glucose within goal  Electrolytes:   K low , despite IV replacement   Na, CorrCa 10.3,  WNL.  Phos and Mag elevated   Renal: SCr 1.41   LFTs: slightly elevated on admit, AST/ALT now remain mildly elevated but fairly stable.  Tbili down to 1.4, alkP down to 217  TGs: elevated 180 (1/7), 465 (1/14), 340 (1/21), 325 (1/22), 237 (1/24) - not receiving lipids currently  Prealbumin: 8.4 (1.7), 6.1 (1/14), 19.1 (1/21)   NUTRITIONAL GOALS                                                                                             RD recs (09/20/17):  Kcal:  1700-1900 Protein:  80-90 grams Fluid:  >/= 1.7 L/d  Clinimix-E 5/15 at a goal rate of 75 ml/hr + 20% fat emulsion 240 ml / day to provide: 90 g/day protein, 1758 Kcal/day.  Clinimix-E 5/15 at a goal rate of 75 ml/hr + 20% fat emulsion 240 ml / day only 3 times per week to provide a daily average  of 90 g/day protein, 1484 Kcal/day.  Meeting 100% of protein goals and 87% of kcal goals.  If needing to increase total Kcal while on 3 times per week lipids:  Clinimix-E 5/20 at a goal rate of 75 ml/hr + 20% fat emulsion 240 ml / day only 3 times per week to provide a daily average of 90 g/day protein, 1789 Kcal/day.  Meeting 100% of protein goals and 100% of kcal goals.  PLAN                                                                                 Now: KCl 10 mEq IV x 4 runs  Plan for today:  ~1700 chemo will be complete and pt to discharge home.  [facility] to mix TPN, bring to hospital and start prior to discharge, d/w Carolynn Sayers.  IF pt does not go home  Change to Clinimix  5/15 at 75 ml/hr (no electrolytes due to elevated Mag and phos)  lipids 20% fat emulsion at 20 ml/hr for 12 hours.  Plan to give lipids only 3 times per week until sure that TG will remain stable.    Recheck Triglycerides next on Sunday, 1/27 and resume holding if >400.    TPN to contain standard multivitamins and trace elements  IVF per MD  TPN lab panels on Mondays & Thursdays  F/u daily  Dorian Pod  Glennon Mac RPh 10/08/2017, 12:58 PM Pager [phone]

## 2017-10-08 NOTE — Progress Notes (Signed)
IP PROGRESS NOTE  Subjective:   Christie Maynard reports no flatus or bowel movement.  No nausea.  The gastrostomy tube remains open to drainage.  No cold sensitivity.  No recurrent abdominal pain. Vital signs in last 24 hours: Blood pressure 122/79, pulse 95, temperature 98.4 F (36.9 C), temperature source Oral, resp. rate 12, height 5' 2"  (1.575 m), weight 157 lb 10.1 oz (71.5 kg), SpO2 96 %.  Intake/Output from previous day: 01/24 0701 - 01/25 0700 In: 854 [P.O.:600; I.V.:254] Out: 4850 [Urine:1900; Drains:2950]  Physical Exam:  HEENT: No thrush Lungs: Clear anteriorly, no respiratory distress Cardiac: Regular rate and rhythm Abdomen: Distended, midline incision has healed, left upper quadrant gastrostomy tube site with a gauze dressing, nontender Vascular: Trace pitting edema at the lower legs and feet     Lab Results: Recent Labs    10/07/17 0500 10/08/17 0500  WBC 13.7* 11.6*  HGB 9.2* 9.6*  HCT 28.7* 29.5*  PLT 483* 451*    BMET Recent Labs    10/07/17 0500 10/08/17 0500  NA 138 138  K 3.0* 3.3*  CL 95* 96*  CO2 34* 32  GLUCOSE 136* 145*  BUN 49* 55*  CREATININE 1.38* 1.41*  CALCIUM 8.2* 8.2*    Lab Results  Component Value Date   CEA1 2,751.0 (H) 09/16/2017    Medications: I have reviewed the patient's current medications.  Assessment/Plan: 1. Metastatic colon cancer  Initial presentation with nausea and bloating.   CT abdomen/pelvis 09/03/2017 showed a multilobulated irregular heterogeneous enhancing soft tissue mass within the pelvis either involving both ovaries or a single mass arising from the right and extending across the midline with components measuring 10.6 x 4.9 x 9.8 cm and 8.2 x 4.9 x 4.8 cm.  There was associated ascites and omental caking consistent with peritoneal carcinomatosis.  A distal small bowel obstruction was suspected.  There was distal left ureteral obstruction with left hydronephrosis and hydroureter.    Ultrasound  paracentesis was performed 09/04/2017.  Atypical cells were present, suspicious for adenocarcinoma of the GI tract.   Paracentesis 09/13/2017 with atypical cells present.  CT 09/14/2017 showed a progressing small bowel obstruction with diffusely dilated fluid-filled small bowel.  Transition zone in the right lower quadrant.  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  Right lower quadrant mass measuring 4.4 cm.    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  Nonspecific staining for PAX-8.  Negative for estrogen receptor, progesterone receptor and cytokeratin 7.  Colonoscopy 09/22/2017-cecum mass, biopsy confirmed adenocarcinoma with signet cell features  Exploratory laparotomy 09/23/2017- fixed pelvic and cecum masses, carcinomatosis with tumor implants at the abdominal wall, bowel, and omentum, status post a palliative small bowel to right colon bypass and placement of a gastrostomy tube  Cycle 1 FOLFOX 10/06/2017 2. Small bowel obstruction secondary to carcinomatosis and a right cecum tumor, status post palliative small bowel to right colon bypass and placement of gastrostomy tube 09/23/2017 3. Distal left ureteral obstruction with left hydronephrosis and hydroureter status post left ureteral stent placement 09/05/2017. 4. Seizure 1983. 5. Significant family history for breast cancer. 6. Renal failure-ultrasound 10/01/2017, mild right and moderate left hydronephrosis   Christie Maynard appears stable.  She will complete the 5-FU infusion this evening.  She is  stable for discharge from an oncology standpoint. The abdomen remains markedly distended.  She may have significant ascites.  She may benefit  from a palliative paracentesis.    Recommendations:  1.  Okay to discharge to home at completion of the 5-FU infusion today 2.  Continue home TPN at discharge, arrange for cycling the TPN at night 3.  Continue morphine concentrate for pain 4.  Palliative paracentesis today or this can be arranged as an outpatient 5.  Follow-up at the Cancer center 10/20/2017       LOS: 13 days   Betsy Coder, MD   10/08/2017, 3:37 PM

## 2017-10-08 NOTE — Progress Notes (Signed)
Physical Therapy Treatment Patient Details Name: Christie Maynard MRN: [id number] DOB: 1955/12/17 Today's Date: 10/08/2017    History of Present Illness Patient is 62 year old female with known history of seizures and recently diagnosed peritoneal carcinomatosis with left ureteral obstruction, status post stent placement due to suspected stage IIIc ovarian cancer, presented with nausea and vomiting 24 hours in duration. Patient reports poor oral intake and unable to eat solid foods for past few weeks but 24 hours prior to this admission, she felt a lot worse, bloated, uncomfortable. /p EXPLORATORY LAPAROTOMY WITH PALLIATIVE SMALL BOWEL TO RIGHT COLON BY PASS WITH PLACEMENT OF GASTROSTOMY TUBE 1/10 Dr    PT Comments    Pt was seen for another mobility session prior to going home as she is expecting to leave this afternoon.  Her plan is to go directly there with no follow up with therapy as she is close to PLOF.  Will progress mobility if pt is not sent on today on her next session to increase standing balance challenge as tolerated.   Follow Up Recommendations  No PT follow up     Equipment Recommendations  Rolling walker with 5" wheels    Recommendations for Other Services       Precautions / Restrictions Precautions Precautions: Fall Precaution Comments: multiple lines Restrictions Weight Bearing Restrictions: No    Mobility  Bed Mobility Overal bed mobility: Needs Assistance Bed Mobility: Supine to Sit Rolling: Supervision   Supine to sit: Min guard;HOB elevated Sit to supine: Min guard      Transfers Overall transfer level: Needs assistance Equipment used: Rolling walker (2 wheeled) Transfers: Sit to/from Stand Sit to Stand: Supervision Stand pivot transfers: Supervision       General transfer comment: VCs hand placement  Ambulation/Gait Ambulation/Gait assistance: Min guard Ambulation Distance (Feet): 200 Feet Assistive device: Rolling walker (2 wheeled) Gait  Pattern/deviations: Step-through pattern Gait velocity: reduced Gait velocity interpretation: Below normal speed for age/gender     Stairs            Wheelchair Mobility    Modified Rankin (Stroke Patients Only)       Balance Overall balance assessment: Modified Independent                                          Cognition Arousal/Alertness: Awake/alert Behavior During Therapy: WFL for tasks assessed/performed Overall Cognitive Status: Within Functional Limits for tasks assessed                                        Exercises      General Comments        Pertinent Vitals/Pain Pain Assessment: No/denies pain    Home Living                      Prior Function            PT Goals (current goals can now be found in the care plan section) Acute Rehab PT Goals Patient Stated Goal: get home today and rest Progress towards PT goals: Progressing toward goals    Frequency    Min 3X/week      PT Plan Current plan remains appropriate    Co-evaluation  AM-PAC PT "6 Clicks" Daily Activity  Outcome Measure  Difficulty turning over in bed (including adjusting bedclothes, sheets and blankets)?: A Little Difficulty moving from lying on back to sitting on the side of the bed? : A Little Difficulty sitting down on and standing up from a chair with arms (e.g., wheelchair, bedside commode, etc,.)?: A Little Help needed moving to and from a bed to chair (including a wheelchair)?: A Little Help needed walking in hospital room?: A Little Help needed climbing 3-5 steps with a railing? : A Little 6 Click Score: 18    End of Session Equipment Utilized During Treatment: Gait belt Activity Tolerance: Patient tolerated treatment well Patient left: in bed;with call bell/phone within reach Nurse Communication: Mobility status PT Visit Diagnosis: Difficulty in walking, not elsewhere classified (R26.2)      Time: 2174-7159 PT Time Calculation (min) (ACUTE ONLY): 23 min  Charges:  $Gait Training: 8-22 mins $Therapeutic Activity: 8-22 mins                    G Codes:  Functional Assessment Tool Used: AM-PAC 6 Clicks Basic Mobility    Ramond Dial 10/08/2017, 12:46 PM   Mee Hives, PT MS Acute Rehab Dept. Number: [place] [facility] [place]

## 2017-10-08 NOTE — Discharge Summary (Signed)
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  She was diagnosed with a small bowel obstruction.  Further evaluation with colonoscopy revealed cecal mass.  She has been found to have metastatic colon cancer.  Underwent exploratory laparotomy with palliative small bowel right colon bypass and placement of gastrostomy tube on 09/23/17. Postoperatively she has developed prolonged ileus, anasarca and acute kidney injury.  Patient is still not able to take by mouth.  She could not tolerate clamping of the G-tube.  She has been on TPN.  Her overall condition is stable currently. Discussed with general surgery, oncology .  We finally decided to discharge her to home with home health on TPN.  She will follow-up with general surgery and oncology as an outpatient.  Outpatient pharmacy will monitor her need for TPN adjustment and electrolyte abnormalities.  Following problems were addressed during hospitalization:  Small bowel obstruction: Secondary to cecal mass and lately  with postoperative ileus.Underwent explorative laparatomy .  Status post palliative small bowel to right colon bypass with placement of a gastrostomy tube on 1/10.  Developed severe ileus.  Surgery was on board. She has been started on clear liquid diet. But she has not tolerated clamping of G tube clamping .Continue G-tube to gravity She would be discharged on TPN.  She will  follow-up with surgery,Dr Marlou Starks on discharge. She has been encouraged to walk and ambulate. Continue TPN Staples removed by surgery  No bowel movement or passage of flatus today Currently she is tolerating clear liquid diet.  Metastatic colon cancer/peritoneal carcinomatosis.She was initially following with Dr. 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 Medical oncology (Dr. Benay Spice) following. Started on chemotherapy here. Follow-up with oncology as an outpatient.  Acute kidney injury: Improved with IV fluids.  Follow-up BMP in 3 days  Left ureteral obstruction: Status post stent placement.  Leukocytosis: Improving .Most likely secondary to malignancy.  Patient is afebrile and nontoxic.    Follow up  CBC in 3 days.  Anemia: Likely secondary to acute illness/chronic comorbidities.  Currently H&H is stable.   Anasarca/lower extremity edema: Improved with diuresis.  But currently she is on gentle hydration .  Transaminitis: Stable.  Seizure disorder: Continue IV Dilantin.  Plan was to transition to oral when she is able to tolerate more by mouth.But looks like she will be discharged on IV Dilantin through a PICC line.  She cannot tolerate anything solid by mouth, not even  Meds.  Hypokalemia: Supplemented with potassium today     Discharge Diagnoses:  Principal Problem:   Peritoneal carcinomatosis ([place]) Active Problems:   Ascites   Protein-calorie malnutrition, severe ([facility])   Nausea & vomiting   History of seizures   Nausea and vomiting   SBO (small bowel obstruction) (  [place])   Colonic mass   Dehydration   Neoplasm of colon,  malignant ([place])   Leukocytosis   Colon cancer [facility])    Discharge Instructions  Discharge Instructions    Diet - low sodium heart healthy   Complete by:  As directed    Discharge instructions   Complete by:  As directed    1) Follow up with Home heath. 2) Please do a BMP and CBC test in 3 days. 3) Take prescribed medications as instructed. 4)Follow up with your PCP, general surgery and oncology as an outpatient in the given appointment dates. 5) Flush G tube 3 times a day.   Increase activity slowly   Complete by:  As directed    SCHEDULING COMMUNICATION   Complete by:  As directed    Chemotherapy Appointment - 4 hours   TREATMENT CONDITIONS   Complete by:  As directed    Patient should have CBC & CMP within 7 days prior to chemotherapy administration. NOTIFY MD IF: ANC < 1500, Hemoglobin < 8, PLT < 100,000,  Total Bili > 1.5, Creatinine > 1.5, ALT & AST > 80 or if patient has unstable vital signs: Temperature > 38.5, SBP > 180 or < 90, RR > 30 or HR > 100.     Allergies as of 10/08/2017   No Known Allergies     Medication List    STOP taking these medications   ibuprofen 200 MG tablet Commonly known as:  ADVIL,MOTRIN   ondansetron 4 MG tablet Commonly known as:  ZOFRAN   phenytoin 100 MG ER capsule Commonly known as:  DILANTIN   traMADol 50 MG tablet Commonly known as:  ULTRAM     TAKE these medications   phenytoin 50 MG/ML injection Commonly known as:  DILANTIN Inject 2 mLs (100 mg total) into the vein at bedtime.            Durable Medical Equipment  (From admission, onward)        Start     Ordered   10/06/17 1312  For home use only DME 3 n 1  Once     10/06/17 1312   10/06/17 1310  For home use only DME Walker rolling  Once    Question:  Patient needs a walker to treat with the following condition  Answer:  Weakness   10/06/17 1312   09/28/17 1709  For home use only DME 4 wheeled rolling walker with seat  Once    Question:  Patient needs a  walker to treat with the following condition  Answer:  Debility   09/28/17 1709     Follow-up Information    [facility].   Specialty:  Home Health Services Contact information: [place] [address] [phone]        Jovita Kussmaul, MD. Go on 10/18/2017.   Specialty:  General Surgery Why:  Your appointment is 10/18/2017 at 11:20AM. Please arrive 30 minutes prior to your appointment to check in and fill out paperwork. Bring photo ID and insurance information. Contact information: [address] [place] [address] [phone]        Aletha Halim., PA-C. Schedule an appointment as soon as possible for a visit in 1 week(s).   Specialty:  Family Medicine Contact information: [address] [place] [address] [phone]        Ladell Pier, MD. Schedule an appointment as soon as possible  for a visit in 1 week(s).   Specialty:  Oncology Contact information: [place] [address] [phone]          No Known Allergies  Consultations:  General surgery, oncology   Procedures/Studies: Dg Abd 1 View  Result Date: 09/15/2017 CLINICAL DATA:  Nasogastric tube placement.  Pelvic malignancy. EXAM: ABDOMEN - 1 VIEW COMPARISON:  Radiographs and CT 09/14/2017. FINDINGS: 1537 hours. Enteric tube projects below the diaphragm with tip in the left subphrenic region, likely in the gastric fundus. There is a double-J left ureteral stent. Mild bowel wall thickening is present in the left mid abdomen. No supine evidence of free intraperitoneal air. Right pelvic calcifications consistent with phleboliths are stable. IMPRESSION: Enteric tube projects over the left upper quadrant of the abdomen, likely in the gastric fundus. Electronically Signed   By: Richardean Sale M.D.   On: 09/15/2017 16:11   Ct Abdomen Pelvis W Contrast  Result Date: 09/14/2017 CLINICAL DATA:  Abdominal swelling  and vomiting. Increasing weakness. Patient is recently been diagnosed with cervical cancer. Paracentesis 1 day ago and last week. EXAM: CT ABDOMEN AND PELVIS WITH CONTRAST TECHNIQUE: Multidetector CT imaging of the abdomen and pelvis was performed using the standard protocol following bolus administration of intravenous contrast. CONTRAST:  16mL ISOVUE-300 IOPAMIDOL (ISOVUE-300) INJECTION 61% COMPARISON:  09/03/2017 FINDINGS: Lower chest: Small bilateral pleural effusions, greater on the right. Mild basilar atelectasis is likely compressive. Moderate-sized esophageal hiatal hernia. Hepatobiliary: 1 cm diameter hypo dense lesion in segment 4 of the liver may represent a metastatic focus. No other focal liver lesions identified. Gallbladder and bile ducts are unremarkable. Pancreas: Pancreas is atrophic. No focal lesion or inflammatory changes appreciated. Spleen: Spleen size is normal.  No focal lesions. Adrenals/Urinary Tract: Right adrenal gland nodule measuring 2.2 cm in diameter. Hounsfield unit measurements on portal venous and delayed phase imaging demonstrate a relative washout of 55%. Relative washout of 40% or greater suggests benign adenoma. Renal nephrograms are homogeneous. Mildly dilated left intrarenal collecting system. A left ureteral stent is in place. Bladder is unremarkable. Stomach/Bowel: Stomach is not abnormally distended. There is diffuse dilatation of fluid-filled small bowel with decompressed terminal ileum. This is progressing since the previous study. Transition zone appears to be in the right lower quadrant. Appearance is consistent with small bowel obstruction. Cause of obstruction may be extrinsic compression of the small bowel, metastasis, or direct invasion. Colon is decompressed. Small amount of residual contrast material in the colon. Appendix is normal. Vascular/Lymphatic: No significant vascular findings are present. No enlarged abdominal or pelvic lymph nodes. Reproductive: Large  heterogeneous pelvic mass extending above the uterus. This likely arises from the right ovary and appears to demonstrate direct invasion of the uterine fundus on the left. The mass measures about 6.7 by 10.2 x 10.7 cm. Other: Right lower quadrant mesenteric mass measuring 4.4 cm in diameter. Diffuse nodular infiltration throughout the omentum and mesenteric fat consistent with diffuse peritoneal metastasis. Small amount of free fluid throughout the abdomen and pelvis. No free air. Abdominal wall musculature appears intact. Musculoskeletal: Mild degenerative changes in the spine. Heterogeneous appearance of the proximal left femur with trabecular coarsening. Appearance is most consistent with Paget's disease although metastatic lesion could also have this appearance. IMPRESSION: 1. Large heterogeneous pelvic mass lesion consistent with malignancy, likely ovarian in origin. Probable direct invasion of the uterus. 2. Diffuse peritoneal carcinomatosis with nodular infiltration of the omentum and mesentery and diffuse peritoneal fluid. Right lower quadrant mass measuring 4.4  cm diameter, likely metastatic. 3. Progressing small bowel obstruction with diffusely dilated fluid-filled small bowel. Transition zone is in the right lower quadrant. 4. Right adrenal gland nodule has characteristics suggesting a benign adenoma. 5. Bone changes in the proximal left femur probably represent Paget's disease although bone metastasis is not excluded. Consider bone scan for further evaluation if clinically indicated. 6. Left 3 atrial stent in place with mild residual hydronephrosis. 7. 1 cm low-attenuation lesion in the left lobe of the liver suspicious for metastasis. Electronically Signed   By: Lucienne Capers M.D.   On: 09/14/2017 19:29   US Renal  Result Date: 10/01/2017 CLINICAL DATA:  Acute renal injury.  History of ovarian carcinoma EXAM: RENAL ULTRASOUND COMPARISON:  CT abdomen and pelvis September 14, 2017 FINDINGS: Right  Kidney: Length: 11.6 cm. Echogenicity is mildly increased. There is renal cortical thinning. No mass or perinephric fluid. Slight hydronephrosis visualized. There is a small extrarenal pelvis, seen on CT. No sonographically demonstrable calculus or ureterectasis. Left Kidney: Length: 11.8 cm. Echogenicity is mildly increased. There is renal cortical thinning. No mass or perinephric fluid visualized. There is moderate hydronephrosis on the left. No renal calculus or ureterectasis on the left. Bladder: Postvoid with bladder nonvisualized. Other: Moderate ascites present. There is a right adrenal mass measuring 3.0 x 1.8 x 2.6 cm, also seen on recent CT. IMPRESSION: 1. Each kidney shows increased echogenicity and cortical thinning, findings indicative of medical renal disease. 2. Moderate hydronephrosis on the left. Slight hydronephrosis on the right. Obstructing focus on each side not seen. 3.  Urinary bladder decompressed and not visualized. 4.  Right adrenal mass, unchanged from recent CT examination. 5.  Moderate ascites. Electronically Signed   By: Lowella Grip III M.D.   On: 10/01/2017 11:22   Ct Biopsy  Result Date: 09/17/2017 CLINICAL DATA:  Pelvic mass, diffuse peritoneal carcinomatosis and ascites. Prior cytology of peritoneal fluid was not conclusive for gynecologic malignancy and suggested potential GI origin. Request has now been made to perform biopsy of peritoneal tumor for more definitive diagnosis. EXAM: CT GUIDED CORE BIOPSY OF PERITONEAL MASS COMPARISON:  CT of the abdomen and pelvis on 09/14/2017 and 09/03/2017 ANESTHESIA/SEDATION: 2.0 mg IV Versed; 100 mcg IV Fentanyl Total Moderate Sedation Time:  20 minutes. The patient's level of consciousness and physiologic status were continuously monitored during the procedure by Radiology nursing. PROCEDURE: The procedure risks, benefits, and alternatives were explained to the patient. Questions regarding the procedure were encouraged and answered.  The patient understands and consents to the procedure. The abdominal wall was prepped with chlorhexidine in a sterile fashion, and a sterile drape was applied covering the operative field. A sterile gown and sterile gloves were used for the procedure. Local anesthesia was provided with 1% Lidocaine. CT was performed in a supine position. Under CT guidance, a 102 gauge trocar needle was advanced from an anterior oblique position into the left anterior peritoneal cavity. Core biopsy was performed with an 18 gauge core biopsy needle device. A total of 4 samples were obtained and submitted in formalin. Additional CT images were obtained after outer needle removal. COMPLICATIONS: None FINDINGS: Small bowel shows decompression since prior CT on 09/14/2017 after nasogastric decompression. This now allows for a safe window to approach of peritoneal tumor in the anterior peritoneal cavity/omental region. Tumor just deep to the left anterior abdominal wall was targeted and revealed solid tissue with biopsy. IMPRESSION: CT-guided core biopsy performed of peritoneal tumor. Electronically Signed   By: Aletta Edouard  M.D.   On: 09/17/2017 16:26   Dg Chest Port 1 View  Result Date: 09/25/2017 CLINICAL DATA:  Leukocytosis and status post laparotomy and bowel bypass for colon carcinoma. EXAM: PORTABLE CHEST 1 VIEW COMPARISON:  08/30/2017 FINDINGS: Right arm PICC line extends to the distal SVC. The heart size and mediastinal contours are within normal limits. Lung volumes are low with bibasilar atelectasis present. There may be small bilateral pleural effusions. There is no evidence of pulmonary edema, consolidation, pneumothorax or nodules. The visualized skeletal structures are unremarkable. IMPRESSION: Low lung volumes with bibasilar atelectasis. Possible small bilateral pleural effusions. Electronically Signed   By: Aletta Edouard M.D.   On: 09/25/2017 10:39   Dg Abd 2 Views  Result Date: 09/21/2017 CLINICAL DATA:   Follow-up small bowel obstruction. Clinically improved. EXAM: ABDOMEN - 2 VIEW COMPARISON:  Abdominal radiograph of January 2nd 2019 and CT scan of the abdomen of September 17, 2017. FINDINGS: There remain loops of mildly distended gas-filled small bowel to the right of midline and in the mid abdomen. There is a gas within the transverse colon and a small amount of gas within the rectum. There is gas within a hiatal hernia. The esophagogastric tube tip and proximal port project below the hemidiaphragms. No free extraluminal gas collections are observed. A double pigtail stent is present in the left kidney and ureter extending into the bladder. IMPRESSION: Persistent partial distal small bowel obstruction. No evidence of perforation. Hiatal hernia.  The esophagogastric tube is in reasonable position. Electronically Signed   By: David  Martinique M.D.   On: 09/21/2017 13:12   Dg Abd Acute W/chest  Result Date: 09/14/2017 CLINICAL DATA:  Vomiting. EXAM: DG ABDOMEN ACUTE W/ 1V CHEST COMPARISON:  08/26/2017 FINDINGS: Left-sided nephroureteral stent is identified. Small bowel air-fluid levels are identified. Small bowel loops are mildly increased in caliber measuring up to 2.6 cm. There is no evidence of free intraperitoneal air. Heart size and mediastinal contours are within normal limits. There are bilateral pleural effusions noted right greater than left. Both lungs are clear. IMPRESSION: 1. Mildly increased caliber of small bowel loops with air-fluid levels. Cannot rule out low grade partial obstruction. 2. Status post left ureteral stenting. 3. Bilateral pleural effusions. Electronically Signed   By: Kerby Moors M.D.   On: 09/14/2017 16:58   Ir Paracentesis  Result Date: 09/13/2017 INDICATION: Patient with recurrent ascites. Request is made for diagnostic and therapeutic paracentesis. EXAM: ULTRASOUND GUIDED DIAGNOSTIC AND THERAPEUTIC PARACENTESIS MEDICATIONS: 10 mL 2% lidocaine COMPLICATIONS: None immediate.  PROCEDURE: Informed written consent was obtained from the patient after a discussion of the risks, benefits and alternatives to treatment. A timeout was performed prior to the initiation of the procedure. Initial ultrasound scanning demonstrates a large amount of ascites within the left lateral abdomen. The left lateral abdomen was prepped and draped in the usual sterile fashion. 2% lidocaine was used for local anesthesia. Following this, a 19 gauge, 7-cm, Yueh catheter was introduced. An ultrasound image was saved for documentation purposes. The paracentesis was performed. The catheter was removed and a dressing was applied. The patient tolerated the procedure well without immediate post procedural complication. FINDINGS: A total of approximately 2.0 liters of red-colored fluid was removed. Samples were sent to the laboratory as requested by the clinical team. IMPRESSION: Successful ultrasound-guided paracentesis yielding 2.0 liters of peritoneal fluid. Read by:  Brynda Greathouse PA-C Electronically Signed   By: Jerilynn Mages.  Shick M.D.   On: 09/13/2017 15:21  Subjective: Patient seen and examined the bedside this morning.  Remains comfortable.  Denies any abdominal pain.  Still not able to tolerate anything solid by mouth.  She will be discharged today with TPN.   Discharge Exam: Vitals:   10/07/17 2046 10/08/17 0404  BP: 123/77 122/79  Pulse: (!) 105 95  Resp: 14 12  Temp: 98.6 F (37 C) 98.4 F (36.9 C)  SpO2: 96% 96%   Vitals:   10/07/17 0404 10/07/17 1320 10/07/17 2046 10/08/17 0404  BP: 117/79 113/85 123/77 122/79  Pulse: 90 77 (!) 105 95  Resp: 16 16 14 12   Temp: 97.9 F (36.6 C) 98.2 F (36.8 C) 98.6 F (37 C) 98.4 F (36.9 C)  TempSrc: Oral Oral Oral Oral  SpO2: 99% 97% 96% 96%  Weight: 71.1 kg (156 lb 12 oz)   71.5 kg (157 lb 10.1 oz)  Height:        General exam: Appears calm and comfortable ,Not in distress,average built Respiratory system: Bilateral equal air entry, normal  vesicular breath sounds, no wheezes or crackles  Cardiovascular system: S1 & S2 heard, RRR. No JVD, murmurs, rubs, gallops or clicks. No pedal edema. Gastrointestinal system: Abdomen is distended, soft and nontender. No organomegaly or masses felt.  Sluggish bowel sounds.  G-tube present.  Midline surgical incision. Central nervous system: Alert and oriented. No focal neurological deficits. Extremities: No edema, no clubbing ,no cyanosis, distal peripheral pulses palpable. Skin: No cyanosis,No pallor,No Rash,No Ulcer Psychiatry: Judgement and insight appear normal. Mood & affect appropriate      The results of significant diagnostics from this hospitalization (including imaging, microbiology, ancillary and laboratory) are listed below for reference.     Microbiology: No results found for this or any previous visit (from the past 240 hour(s)).   Labs: BNP (last 3 results) No results for input(s): BNP in the last 8760 hours. Basic Metabolic Panel: Recent Labs  Lab 10/03/17 0344 10/04/17 0348 10/06/17 0411 10/06/17 0500 10/07/17 0500 10/08/17 0500  NA 136 136 142  --  138 138  K 3.1* 3.5 3.3*  --  3.0* 3.3*  CL 98* 98* 100*  --  95* 96*  CO2 30 30 33*  --  34* 32  GLUCOSE 129* 124* 119*  --  136* 145*  BUN 59* 55* 51*  --  49* 55*  CREATININE 1.75* 1.68* 1.46*  --  1.38* 1.41*  CALCIUM 8.1* 8.2* 8.3*  --  8.2* 8.2*  MG 2.5* 2.6* 2.4  --  2.3 2.5*  PHOS 3.7 4.0  --  4.2 4.4 4.9*   Liver Function Tests: Recent Labs  Lab 10/02/17 0439 10/03/17 0344 10/04/17 0348 10/06/17 0411 10/07/17 0500  AST 56* 72* 51* 54* 50*  ALT 66* 80* 70* 73* 65*  ALKPHOS 272* 298* 270* 251* 217*  BILITOT 2.2* 2.3* 1.8* 1.7* 1.4*  PROT 5.8* 5.8* 6.0* 5.9* 5.4*  ALBUMIN 1.4* 1.4* 1.6* 1.5* 1.6*   No results for input(s): LIPASE, AMYLASE in the last 168 hours. No results for input(s): AMMONIA in the last 168 hours. CBC: Recent Labs  Lab 10/02/17 0439 10/04/17 0348 10/06/17 0411  10/07/17 0500 10/08/17 0500  WBC 9.0 11.0* 14.1* 13.7* 11.6*  NEUTROABS  --  8.6*  --  11.4* 10.2*  HGB 9.1* 9.9* 9.5* 9.2* 9.6*  HCT 27.7* 30.0* 29.5* 28.7* 29.5*  MCV 85.2 86.2 87.3 87.5 87.8  PLT 481* 531* 519* 483* 451*   Cardiac Enzymes: No results for input(s): CKTOTAL, CKMB, CKMBINDEX,  TROPONINI in the last 168 hours. BNP: Invalid input(s): POCBNP CBG: Recent Labs  Lab 10/06/17 0044 10/06/17 0655 10/06/17 0735 10/06/17 1207 10/06/17 1626  GLUCAP 138* 116* 124* 111* 164*   D-Dimer No results for input(s): DDIMER in the last 72 hours. Hgb A1c No results for input(s): HGBA1C in the last 72 hours. Lipid Profile Recent Labs    10/07/17 0500  TRIG 237*   Thyroid function studies No results for input(s): TSH, T4TOTAL, T3FREE, THYROIDAB in the last 72 hours.  Invalid input(s): FREET3 Anemia work up No results for input(s): VITAMINB12, FOLATE, FERRITIN, TIBC, IRON, RETICCTPCT in the last 72 hours. Urinalysis    Component Value Date/Time   COLORURINE AMBER (A) 10/02/2017 0008   APPEARANCEUR HAZY (A) 10/02/2017 0008   LABSPEC 1.014 10/02/2017 0008   PHURINE 5.0 10/02/2017 0008   GLUCOSEU NEGATIVE 10/02/2017 0008   HGBUR LARGE (A) 10/02/2017 0008   BILIRUBINUR NEGATIVE 10/02/2017 0008   KETONESUR NEGATIVE 10/02/2017 0008   PROTEINUR 30 (A) 10/02/2017 0008   NITRITE NEGATIVE 10/02/2017 0008   LEUKOCYTESUR TRACE (A) 10/02/2017 0008   Sepsis Labs Invalid input(s): PROCALCITONIN,  WBC,  LACTICIDVEN Microbiology No results found for this or any previous visit (from the past 240 hour(s)).   Time coordinating discharge: Over 30 minutes  SIGNED:   Marene Lenz, MD  [facility] 10/08/2017, 11:54 AM Pager [phone]  If 7PM-7AM, please contact night-coverage [email or web address] Password [email or web address]

## 2017-10-08 NOTE — Progress Notes (Addendum)
[facility] Progress Note  15 Days Post-Op  Subjective: CC:  No complaints today. States that she's ready to go home. Completed chemotherapy yesterday. Denies n/v. Abdominal pain well controlled. No flatus or BM today.  Objective: Vital signs in last 24 hours: Temp:  [98.2 F (36.8 C)-98.6 F (37 C)] 98.4 F (36.9 C) (01/25 0404) Pulse Rate:  [77-105] 95 (01/25 0404) Resp:  [12-16] 12 (01/25 0404) BP: (113-123)/(77-85) 122/79 (01/25 0404) SpO2:  [96 %-97 %] 96 % (01/25 0404) Weight:  [71.5 kg (157 lb 10.1 oz)] 71.5 kg (157 lb 10.1 oz) (01/25 0404) Last BM Date: 10/05/17  Intake/Output from previous day: 01/24 0701 - 01/25 0700 In: 854 [P.O.:600; I.V.:254] Out: 4850 [Urine:1900; Drains:2950] Intake/Output this shift: Total I/O In: 360 [P.O.:360] Out: 500 [Urine:500]  PE: Gen: Alert, NAD, pleasant HEENT: EOM's intact, pupils equal and round Card: RRR, no M/G/R heard Pulm:effort normal OAC:ZYSAYT firm,distended,few BS heard, midlineincision C/D/Iwith steris in place and no erythema or drainage, G-tube to gravity Psych: A&Ox3  Skin: no rashes noted, warm and dry    Lab Results:  Recent Labs    10/07/17 0500 10/08/17 0500  WBC 13.7* 11.6*  HGB 9.2* 9.6*  HCT 28.7* 29.5*  PLT 483* 451*   BMET Recent Labs    10/07/17 0500 10/08/17 0500  NA 138 138  K 3.0* 3.3*  CL 95* 96*  CO2 34* 32  GLUCOSE 136* 145*  BUN 49* 55*  CREATININE 1.38* 1.41*  CALCIUM 8.2* 8.2*   PT/INR No results for input(s): LABPROT, INR in the last 72 hours. CMP     Component Value Date/Time   NA 138 10/08/2017 0500   K 3.3 (L) 10/08/2017 0500   CL 96 (L) 10/08/2017 0500   CO2 32 10/08/2017 0500   GLUCOSE 145 (H) 10/08/2017 0500   BUN 55 (H) 10/08/2017 0500   CREATININE 1.41 (H) 10/08/2017 0500   CALCIUM 8.2 (L) 10/08/2017 0500   PROT 5.4 (L) 10/07/2017 0500   ALBUMIN 1.6 (L) 10/07/2017 0500   AST 50 (H) 10/07/2017 0500   ALT 65 (H) 10/07/2017 0500   ALKPHOS 217 (H) 10/07/2017 0500   BILITOT 1.4 (H) 10/07/2017 0500   GFRNONAA 39 (L) 10/08/2017 0500   GFRAA 45 (L) 10/08/2017 0500   Lipase     Component Value Date/Time   LIPASE 69 (H) 09/14/2017 1149       Studies/Results: No results found.  Anti-infectives: Anti-infectives (From admission, onward)   Start     Dose/Rate Route Frequency Ordered Stop   09/25/17 1200  piperacillin-tazobactam (ZOSYN) IVPB 3.375 g  Status:  Discontinued     3.375 g 12.5 mL/hr over 240 Minutes Intravenous Every 8 hours 09/25/17 1054 09/30/17 0852   09/23/17 1249  cefoTEtan in Dextrose 5% (CEFOTAN) 2-2.08 GM-%(50ML) IVPB    Comments:  [name]   : cabinet override      09/23/17 1249 09/24/17 0059   09/23/17 1100  cefoTEtan (CEFOTAN) 2 g in dextrose 5 % 50 mL IVPB     2 g 100 mL/hr over 30 Minutes Intravenous  Once 09/22/17 1549 09/23/17 1333       Assessment/Plan SBO/cecal mass Metastatic colon cancer, carcinomatosis S/pEXPLORATORY LAPAROTOMY WITH PALLIATIVE SMALL BOWEL TO RIGHT COLON BY PASS WITH PLACEMENT OF GASTROSTOMY TUBE1/10 Dr. Marlou Starks - POD15 - G tube to gravity and clear liquids for comfort; has not tolerated clamping trials - completed chemo 1/23>>1/24, planning to d/c home today  Elevated creatinine-Cr 1.41, stable Elevated LFTs -  slightly down yesterday,may be2/2 TNA  ID -zosyn 1/12>>1/16, cefotetan 1/10>>1/11 FEN -TPN@ 40 ml/hr, clear liquids, Boost VTE -lovenox Foley -none Follow up -Dr. Marlou Starks 2/4  Plan - Patient ready for discharge today from surgical standpoint. She is going home on TPN. Continue G-tube to gravity, clears for comfort. Staples have been removed. Patient will follow up with Dr. Marlou Starks (appointment and discharge instructions on AVS).    LOS: 23 days    Wellington Hampshire, [facility] Surgery 10/08/2017, 11:30 AM Pager: [phone] Consults: [phone] Mon-Fri 7:00 am-4:30 pm Sat-Sun 7:00 am-11:30 am

## 2017-10-10 ENCOUNTER — Other Ambulatory Visit (HOSPITAL_COMMUNITY)
Admission: RE | Admit: 2017-10-10 | Discharge: 2017-10-10 | Disposition: A | Discharge status: Home / Self Care | Payer: BLUE CROSS/BLUE SHIELD | Source: Other Acute Inpatient Hospital | Attending: Oncology | Admitting: Oncology

## 2017-10-10 DIAGNOSIS — Z5181 Encounter for therapeutic drug level monitoring: Secondary | ICD-10-CM | POA: Diagnosis present

## 2017-10-10 LAB — COMPREHENSIVE METABOLIC PANEL
ALT: 59 U/L — ABNORMAL HIGH (ref 14–54)
AST: 65 U/L — AB (ref 15–41)
Albumin: 1.5 g/dL — ABNORMAL LOW (ref 3.5–5.0)
Alkaline Phosphatase: 163 U/L — ABNORMAL HIGH (ref 38–126)
Anion gap: 12 (ref 5–15)
BUN: 67 mg/dL — ABNORMAL HIGH (ref 6–20)
CHLORIDE: 96 mmol/L — AB (ref 101–111)
CO2: 34 mmol/L — ABNORMAL HIGH (ref 22–32)
Calcium: 8.8 mg/dL — ABNORMAL LOW (ref 8.9–10.3)
Creatinine, Ser: 1.3 mg/dL — ABNORMAL HIGH (ref 0.44–1.00)
GFR calc Af Amer: 50 mL/min — ABNORMAL LOW (ref >60)
GFR, EST NON AFRICAN AMERICAN: 43 mL/min — AB (ref >60)
Glucose, Bld: 107 mg/dL — ABNORMAL HIGH (ref 65–99)
POTASSIUM: 3.2 mmol/L — AB (ref 3.5–5.1)
Sodium: 142 mmol/L (ref 135–145)
Total Bilirubin: 2.4 mg/dL — ABNORMAL HIGH (ref 0.3–1.2)
Total Protein: 5.9 g/dL — ABNORMAL LOW (ref 6.5–8.1)

## 2017-10-10 LAB — PHOSPHORUS: PHOSPHORUS: 5 mg/dL — AB (ref 2.5–4.6)

## 2017-10-10 LAB — MAGNESIUM: MAGNESIUM: 2.4 mg/dL (ref 1.7–2.4)

## 2017-10-11 ENCOUNTER — Ambulatory Visit: Payer: BLUE CROSS/BLUE SHIELD | Admitting: Nurse Practitioner

## 2017-10-11 ENCOUNTER — Telehealth: Payer: Self-pay

## 2017-10-11 NOTE — Telephone Encounter (Signed)
Spoke with Judson Roch, RN from [facility]. Verbal order for starter care frequency okay per MD.   Damaris Schooner with [facility] in [place]. Advised that it is possible to cycle TPN at night once pt is weaned down. This RN voiced understanding.

## 2017-10-12 ENCOUNTER — Observation Stay (HOSPITAL_COMMUNITY): Payer: BLUE CROSS/BLUE SHIELD

## 2017-10-12 ENCOUNTER — Other Ambulatory Visit: Payer: Self-pay

## 2017-10-12 ENCOUNTER — Telehealth: Payer: Self-pay

## 2017-10-12 ENCOUNTER — Inpatient Hospital Stay (HOSPITAL_COMMUNITY)
Admission: AD | Admit: 2017-10-12 | Discharge: 2017-10-15 | DRG: 375 | Disposition: A | Discharge status: Home / Self Care | Payer: BLUE CROSS/BLUE SHIELD | Source: Ambulatory Visit | Attending: Surgery | Admitting: Surgery

## 2017-10-12 ENCOUNTER — Other Ambulatory Visit: Payer: Self-pay | Admitting: Surgery

## 2017-10-12 ENCOUNTER — Encounter (HOSPITAL_COMMUNITY): Payer: Self-pay | Admitting: *Deleted

## 2017-10-12 DIAGNOSIS — R0602 Shortness of breath: Secondary | ICD-10-CM

## 2017-10-12 DIAGNOSIS — R180 Malignant ascites: Secondary | ICD-10-CM | POA: Diagnosis present

## 2017-10-12 DIAGNOSIS — E876 Hypokalemia: Secondary | ICD-10-CM | POA: Diagnosis present

## 2017-10-12 DIAGNOSIS — Z8543 Personal history of malignant neoplasm of ovary: Secondary | ICD-10-CM

## 2017-10-12 DIAGNOSIS — Z960 Presence of urogenital implants: Secondary | ICD-10-CM | POA: Diagnosis present

## 2017-10-12 DIAGNOSIS — R569 Unspecified convulsions: Secondary | ICD-10-CM | POA: Diagnosis present

## 2017-10-12 DIAGNOSIS — C189 Malignant neoplasm of colon, unspecified: Secondary | ICD-10-CM | POA: Diagnosis present

## 2017-10-12 DIAGNOSIS — C786 Secondary malignant neoplasm of retroperitoneum and peritoneum: Principal | ICD-10-CM | POA: Diagnosis present

## 2017-10-12 DIAGNOSIS — Z85038 Personal history of other malignant neoplasm of large intestine: Secondary | ICD-10-CM

## 2017-10-12 LAB — COMPREHENSIVE METABOLIC PANEL
ALK PHOS: 181 U/L — AB (ref 38–126)
ALT: 88 U/L — AB (ref 14–54)
AST: 72 U/L — ABNORMAL HIGH (ref 15–41)
Albumin: 1.4 g/dL — ABNORMAL LOW (ref 3.5–5.0)
Anion gap: 12 (ref 5–15)
BUN: 78 mg/dL — AB (ref 6–20)
CALCIUM: 8.7 mg/dL — AB (ref 8.9–10.3)
CHLORIDE: 101 mmol/L (ref 101–111)
CO2: 31 mmol/L (ref 22–32)
CREATININE: 1.24 mg/dL — AB (ref 0.44–1.00)
GFR, EST AFRICAN AMERICAN: 52 mL/min — AB (ref >60)
GFR, EST NON AFRICAN AMERICAN: 45 mL/min — AB (ref >60)
Glucose, Bld: 125 mg/dL — ABNORMAL HIGH (ref 65–99)
Potassium: 2.8 mmol/L — ABNORMAL LOW (ref 3.5–5.1)
Sodium: 144 mmol/L (ref 135–145)
Total Bilirubin: 2.4 mg/dL — ABNORMAL HIGH (ref 0.3–1.2)
Total Protein: 6.2 g/dL — ABNORMAL LOW (ref 6.5–8.1)

## 2017-10-12 LAB — CBC WITH DIFFERENTIAL/PLATELET
Basophils Absolute: 0 10*3/uL (ref 0.0–0.1)
Basophils Relative: 0 %
EOS ABS: 0 10*3/uL (ref 0.0–0.7)
EOS PCT: 0 %
HCT: 29.7 % — ABNORMAL LOW (ref 36.0–46.0)
Hemoglobin: 9.6 g/dL — ABNORMAL LOW (ref 12.0–15.0)
LYMPHS ABS: 0.7 10*3/uL (ref 0.7–4.0)
Lymphocytes Relative: 11 %
MCH: 28 pg (ref 26.0–34.0)
MCHC: 32.3 g/dL (ref 30.0–36.0)
MCV: 86.6 fL (ref 78.0–100.0)
MONOS PCT: 0 %
Monocytes Absolute: 0 10*3/uL — ABNORMAL LOW (ref 0.1–1.0)
NEUTROS PCT: 89 %
Neutro Abs: 5.5 10*3/uL (ref 1.7–7.7)
PLATELETS: 217 10*3/uL (ref 150–400)
RBC: 3.43 MIL/uL — AB (ref 3.87–5.11)
RDW: 18.3 % — ABNORMAL HIGH (ref 11.5–15.5)
WBC: 6.2 10*3/uL (ref 4.0–10.5)

## 2017-10-12 LAB — MAGNESIUM: MAGNESIUM: 2.4 mg/dL (ref 1.7–2.4)

## 2017-10-12 LAB — PHOSPHORUS: PHOSPHORUS: 4 mg/dL (ref 2.5–4.6)

## 2017-10-12 MED ORDER — PHENYTOIN SODIUM 50 MG/ML IJ SOLN
100.0000 mg | Freq: Every day | INTRAMUSCULAR | Status: DC
  Administered 2017-10-12 – 2017-10-14 (×3): 100 mg via INTRAVENOUS
  Filled 2017-10-12 (×3): qty 2

## 2017-10-12 MED ORDER — HYDROCODONE-ACETAMINOPHEN 5-325 MG PO TABS: 1.0000 | ORAL_TABLET | ORAL | Status: DC | PRN

## 2017-10-12 MED ORDER — LIDOCAINE HCL 1 % IJ SOLN
INTRAMUSCULAR | Status: AC
  Filled 2017-10-12: qty 10

## 2017-10-12 MED ORDER — HEPARIN SODIUM (PORCINE) 5000 UNIT/ML IJ SOLN
5000.0000 [IU] | Freq: Three times a day (TID) | INTRAMUSCULAR | Status: DC
  Administered 2017-10-12 – 2017-10-15 (×8): 5000 [IU] via SUBCUTANEOUS
  Filled 2017-10-12 (×8): qty 1

## 2017-10-12 MED ORDER — KCL IN DEXTROSE-NACL 20-5-0.45 MEQ/L-%-% IV SOLN
INTRAVENOUS | Status: DC
  Administered 2017-10-12 – 2017-10-14 (×2): via INTRAVENOUS
  Filled 2017-10-12 (×3): qty 1000

## 2017-10-12 MED ORDER — ALTEPLASE 2 MG IJ SOLR
2.0000 mg | Freq: Once | INTRAMUSCULAR | Status: AC
  Administered 2017-10-12: 2 mg
  Filled 2017-10-12 (×2): qty 2

## 2017-10-12 MED ORDER — ALTEPLASE 2 MG IJ SOLR
2.0000 mg | Freq: Once | INTRAMUSCULAR | Status: AC
  Administered 2017-10-12: 2 mg
  Filled 2017-10-12: qty 2

## 2017-10-12 MED ORDER — POTASSIUM CHLORIDE 10 MEQ/100ML IV SOLN
10.0000 meq | INTRAVENOUS | Status: AC
  Administered 2017-10-12 (×4): 10 meq via INTRAVENOUS
  Filled 2017-10-12 (×5): qty 100

## 2017-10-12 MED ORDER — POTASSIUM CHLORIDE 10 MEQ/100ML IV SOLN
10.0000 meq | INTRAVENOUS | Status: AC
  Filled 2017-10-12 (×4): qty 100

## 2017-10-12 MED ORDER — SODIUM CHLORIDE 0.9% FLUSH
10.0000 mL | INTRAVENOUS | Status: DC | PRN
  Administered 2017-10-13: 20 mL
  Filled 2017-10-12: qty 40

## 2017-10-12 MED ORDER — TRACE MINERALS CR-CU-MN-SE-ZN 10-1000-500-60 MCG/ML IV SOLN
INTRAVENOUS | Status: AC
  Administered 2017-10-12: 18:00:00 via INTRAVENOUS
  Filled 2017-10-12: qty 1800

## 2017-10-12 NOTE — Telephone Encounter (Addendum)
Melissa in pharmacy voiced understanding of message below. Will adjust per labs per protocol.   ----- Message from Ladell Pier, MD sent at 10/12/2017 11:11 AM EST ----- Please call homecare pharmacy and ask pharmacist to adjust electrolytes in TPN

## 2017-10-12 NOTE — Procedures (Signed)
Ultrasound-guided  therapeutic paracentesis performed yielding 5.5  liters of golden yellow fluid. No immediate complications.

## 2017-10-12 NOTE — H&P (Signed)
Chief Complaint:  Increased work of breathing and increasing girth from malignant ascites  History of Present Illness:  Christie Maynard is an 62 y.o. 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  Her ascites has reaccumulated and is more than ever and it has impacted her work of breathing.  Admitted for paracentesis.    Past Medical History:  Diagnosis Date  . Cancer ([place])   . Ovarian ca ([place])   . Seizures ([place])   . UTI (urinary tract infection)     Past Surgical History:  Procedure Laterality Date  . COLONOSCOPY WITH PROPOFOL N/A 09/22/2017   Procedure: COLONOSCOPY WITH PROPOFOL;  Surgeon: Ronald Lobo, MD;  Location: [facility];  Service: Endoscopy;  Laterality: N/A;  The patient will have a tap water enema prep because she is recovering from bowel obstruction  . CYSTOSCOPY W/ URETERAL STENT PLACEMENT Left 09/05/2017   Procedure: CYSTOSCOPY WITH LEFT URETERAL STENT REPLACEMENT;  Surgeon: Raynelle Bring, MD;  Location: [facility];  Service: Urology;  Laterality: Left;  . ESOPHAGOGASTRODUODENOSCOPY (EGD) WITH PROPOFOL N/A 09/22/2017   Procedure: ESOPHAGOGASTRODUODENOSCOPY (EGD) WITH PROPOFOL;  Surgeon: Ronald Lobo, MD;  Location: [facility];  Service: Endoscopy;  Laterality: N/A;  Leave NG in place, please use PEDIATRIC UPPER endoscope  . IR PARACENTESIS  09/13/2017  . LAPAROTOMY N/A 09/23/2017   Procedure: EXPLORATORY LAPAROTOMY WITH PALLIATIVE SMALL BOWEL TO RIGHT COLON BY PASS WITH PLACEMENT OF GASTROSTOMY TUBE;  Surgeon: Jovita Kussmaul, MD;  Location: [facility];  Service: General;  Laterality: N/A;  . NO PAST SURGERIES      Current Facility-Administered Medications  Medication Dose Route Frequency Provider Last Rate Last Dose  . dextrose 5 % and 0.45 % NaCl with KCl 20 mEq/L infusion   Intravenous Continuous Johnathan Hausen, MD      . heparin injection 5,000 Units  5,000 Units Subcutaneous Q8H Johnathan Hausen, MD      .  HYDROcodone-acetaminophen (NORCO/VICODIN) 5-325 MG per tablet 1-2 tablet  1-2 tablet Oral Q4H PRN Johnathan Hausen, MD       Patient has no known allergies. Family History  Problem Relation Age of Onset  . Breast cancer Mother   . Lung cancer Father   . Breast cancer Sister   . Breast cancer Maternal Aunt   . Breast cancer Paternal Aunt   . Cancer Paternal Uncle    Social History:   reports that  has never smoked. she has never used smokeless tobacco. She reports that she does not drink alcohol or use drugs.   REVIEW OF SYSTEMS : Negative except for see problem list  Physical Exam:   There were no vitals taken for this visit. There is no height or weight on file to calculate BMI.  Gen:  WDWN WF NAD  Neurological: Alert and oriented to person, place, and time. Motor and sensory function is grossly intact  Head: Normocephalic and atraumatic.  Eyes: Conjunctivae are normal. Pupils are equal, round, and reactive to light. No scleral icterus.  Neck: Normal range of motion. Neck supple. No tracheal deviation or thyromegaly present.  Cardiovascular:  SR without murmurs  Respiratory: Shallow respirations without stridor Abdomen:  Firm, tense and nontender GU:  Not examined Musculoskeletal: Normal range of motion. Extremities are nontender. Pedal edema Lymphadenopathy: No cervical, preauricular, postauricular or axillary adenopathy is present Skin: Skin is warm and dry. No rash noted. No diaphoresis. No erythema. No pallor. LABORATORY RESULTS: Results for orders  placed or performed during the hospital encounter of 10/10/17 (from the past 48 hour(s))  Magnesium     Status: None   Collection Time: 10/10/17  2:00 PM  Result Value Ref Range   Magnesium 2.4 1.7 - 2.4 mg/dL  Phosphorus     Status: Abnormal   Collection Time: 10/10/17  2:00 PM  Result Value Ref Range   Phosphorus 5.0 (H) 2.5 - 4.6 mg/dL  Comprehensive metabolic panel     Status: Abnormal   Collection Time: 10/10/17  2:00  PM  Result Value Ref Range   Sodium 142 135 - 145 mmol/L   Potassium 3.2 (L) 3.5 - 5.1 mmol/L   Chloride 96 (L) 101 - 111 mmol/L   CO2 34 (H) 22 - 32 mmol/L   Glucose, Bld 107 (H) 65 - 99 mg/dL   BUN 67 (H) 6 - 20 mg/dL   Creatinine, Ser 1.30 (H) 0.44 - 1.00 mg/dL   Calcium 8.8 (L) 8.9 - 10.3 mg/dL   Total Protein 5.9 (L) 6.5 - 8.1 g/dL   Albumin 1.5 (L) 3.5 - 5.0 g/dL   AST 65 (H) 15 - 41 U/L   ALT 59 (H) 14 - 54 U/L   Alkaline Phosphatase 163 (H) 38 - 126 U/L   Total Bilirubin 2.4 (H) 0.3 - 1.2 mg/dL   GFR calc non Af Amer 43 (L) >60 mL/min   GFR calc Af Amer 50 (L) >60 mL/min    Comment: (NOTE) The eGFR has been calculated using the CKD EPI equation. This calculation has not been validated in all clinical situations. eGFR's persistently <60 mL/min signify possible Chronic Kidney Disease.    Anion gap 12 5 - 15     RADIOLOGY RESULTS: No results found.  Problem List: Patient Active Problem List   Diagnosis Date Noted  . Malignant ascites 10/12/2017  . Colon cancer ([place]) 10/05/2017  . Leukocytosis 09/25/2017  . SBO (small bowel obstruction) ([place]) 09/22/2017  . Colonic mass 09/22/2017  . Dehydration   . Neoplasm of colon, malignant ([place])   . Nausea and vomiting 09/15/2017  . Nausea & vomiting 09/14/2017  . Peritoneal carcinomatosis ([place]) 09/14/2017  . History of seizures 09/14/2017  . Protein-calorie malnutrition, severe ([place]) 09/08/2017  . Ovarian mass 09/05/2017  . Other ascites 09/03/2017  . Ascites 09/03/2017    Assessment & Plan: Admit for paracentesis;  Post first round of chemotherapy.      Matt B. Hassell Done, MD, [facility] [phone] beeper [phone]  10/12/2017 10:45 AM

## 2017-10-12 NOTE — Progress Notes (Signed)
[name] NOTE   Pharmacy Consult for TPN Indication:   Patient Measurements:     There is no height or weight on file to calculate BMI.  Insulin Requirements: none previous admission on TPN  Current Nutrition: NPO   IVF: D5 & 1/2 NS with 20 mEq KCL/L at 50 ml/hr  Central access:  TPN start date:   ASSESSMENT                                                                                                          HPI: 74 yoF re-admitted 1/29 with malignant ascites, requiring paracentesis.   Recent admit 1/1 to 1/25: new diagnosis peritoneal carcinomatosis, Ovarian Ca, L ureteral obstruction with stent, Bowel obstruction due to cecal mass/new colon Ca and begun on TPN - discharged home on TPN per [place] (aware of admission). G tube placed, meds given IV. First cycle chemo  Significant events:   Today:    Glucose - serum glucose 125  Electrolytes - K 2.8, Corr Ca 10.8, Mag/Phos wnl  Renal -  LFTs -  TGs -  Prealbumin -  NUTRITIONAL GOALS                                                                                             RD recs: pending this admission Clinimix E 5/15 at a goal rate of 55ml/hr + 20% fat emulsion MWF at 51ml/hr to provide:  PLAN                                                                                                                         At 1800 today:  Start Clinimix E 5/15 at 67ml/hr.  20% fat emulsion at 49ml/hr only Monday/Wednesday/Friday  TPN to contain standard multivitamins and trace elements.  IV fluid per MD  TPN lab panels on Mondays & Thursdays.  F/u daily.  Minda Ditto PharmD Pager [phone] 10/12/2017, 1:40 PM

## 2017-10-12 NOTE — Progress Notes (Signed)
[facility]  Mrs. Pribyl is an active pt with [facility] prior to this admission.  [facility] is providing [place] services as well as [facility] services for home TNA. Please see formula below.  Pt is receiving same formula daily with lipids on M, W,F, non lipid bags, T, Th, S,S.      If patient discharges after hours, please call [phone].   Larry Sierras 10/12/2017, 10:45 AM

## 2017-10-13 DIAGNOSIS — N19 Unspecified kidney failure: Secondary | ICD-10-CM | POA: Diagnosis not present

## 2017-10-13 DIAGNOSIS — Z960 Presence of urogenital implants: Secondary | ICD-10-CM | POA: Diagnosis present

## 2017-10-13 DIAGNOSIS — R569 Unspecified convulsions: Secondary | ICD-10-CM | POA: Diagnosis present

## 2017-10-13 DIAGNOSIS — R180 Malignant ascites: Secondary | ICD-10-CM | POA: Diagnosis present

## 2017-10-13 DIAGNOSIS — Z85038 Personal history of other malignant neoplasm of large intestine: Secondary | ICD-10-CM | POA: Diagnosis not present

## 2017-10-13 DIAGNOSIS — C180 Malignant neoplasm of cecum: Secondary | ICD-10-CM | POA: Diagnosis not present

## 2017-10-13 DIAGNOSIS — C189 Malignant neoplasm of colon, unspecified: Secondary | ICD-10-CM | POA: Diagnosis present

## 2017-10-13 DIAGNOSIS — Z931 Gastrostomy status: Secondary | ICD-10-CM | POA: Diagnosis not present

## 2017-10-13 DIAGNOSIS — E876 Hypokalemia: Secondary | ICD-10-CM | POA: Diagnosis present

## 2017-10-13 DIAGNOSIS — Z803 Family history of malignant neoplasm of breast: Secondary | ICD-10-CM | POA: Diagnosis not present

## 2017-10-13 DIAGNOSIS — C786 Secondary malignant neoplasm of retroperitoneum and peritoneum: Secondary | ICD-10-CM | POA: Diagnosis present

## 2017-10-13 DIAGNOSIS — N1330 Unspecified hydronephrosis: Secondary | ICD-10-CM | POA: Diagnosis not present

## 2017-10-13 DIAGNOSIS — Z8543 Personal history of malignant neoplasm of ovary: Secondary | ICD-10-CM | POA: Diagnosis not present

## 2017-10-13 DIAGNOSIS — R140 Abdominal distension (gaseous): Secondary | ICD-10-CM | POA: Diagnosis not present

## 2017-10-13 LAB — POTASSIUM: POTASSIUM: 3.2 mmol/L — AB (ref 3.5–5.1)

## 2017-10-13 LAB — CBC
HCT: 26.5 % — ABNORMAL LOW (ref 36.0–46.0)
Hemoglobin: 8.7 g/dL — ABNORMAL LOW (ref 12.0–15.0)
MCH: 28.4 pg (ref 26.0–34.0)
MCHC: 32.8 g/dL (ref 30.0–36.0)
MCV: 86.6 fL (ref 78.0–100.0)
PLATELETS: 177 10*3/uL (ref 150–400)
RBC: 3.06 MIL/uL — AB (ref 3.87–5.11)
RDW: 18.8 % — ABNORMAL HIGH (ref 11.5–15.5)
WBC: 5.6 10*3/uL (ref 4.0–10.5)

## 2017-10-13 LAB — COMPREHENSIVE METABOLIC PANEL
ALT: 78 U/L — AB (ref 14–54)
ANION GAP: 10 (ref 5–15)
AST: 73 U/L — ABNORMAL HIGH (ref 15–41)
Albumin: 1.2 g/dL — ABNORMAL LOW (ref 3.5–5.0)
Alkaline Phosphatase: 154 U/L — ABNORMAL HIGH (ref 38–126)
BUN: 69 mg/dL — ABNORMAL HIGH (ref 6–20)
CHLORIDE: 101 mmol/L (ref 101–111)
CO2: 34 mmol/L — AB (ref 22–32)
CREATININE: 1.16 mg/dL — AB (ref 0.44–1.00)
Calcium: 8.1 mg/dL — ABNORMAL LOW (ref 8.9–10.3)
GFR calc non Af Amer: 49 mL/min — ABNORMAL LOW (ref >60)
GFR, EST AFRICAN AMERICAN: 57 mL/min — AB (ref >60)
Glucose, Bld: 133 mg/dL — ABNORMAL HIGH (ref 65–99)
Potassium: 2.9 mmol/L — ABNORMAL LOW (ref 3.5–5.1)
SODIUM: 145 mmol/L (ref 135–145)
Total Bilirubin: 2.2 mg/dL — ABNORMAL HIGH (ref 0.3–1.2)
Total Protein: 5.1 g/dL — ABNORMAL LOW (ref 6.5–8.1)

## 2017-10-13 LAB — PHOSPHORUS: PHOSPHORUS: 3.7 mg/dL (ref 2.5–4.6)

## 2017-10-13 LAB — DIFFERENTIAL
BASOS ABS: 0 10*3/uL (ref 0.0–0.1)
Basophils Relative: 0 %
Eosinophils Absolute: 0 10*3/uL (ref 0.0–0.7)
Eosinophils Relative: 0 %
LYMPHS PCT: 14 %
Lymphs Abs: 0.8 10*3/uL (ref 0.7–4.0)
Monocytes Absolute: 0.1 10*3/uL (ref 0.1–1.0)
Monocytes Relative: 1 %
NEUTROS ABS: 4.8 10*3/uL (ref 1.7–7.7)
NEUTROS PCT: 85 %

## 2017-10-13 LAB — PREALBUMIN: Prealbumin: 12.1 mg/dL — ABNORMAL LOW (ref 18–38)

## 2017-10-13 LAB — MAGNESIUM: MAGNESIUM: 2.2 mg/dL (ref 1.7–2.4)

## 2017-10-13 LAB — TRIGLYCERIDES: Triglycerides: 256 mg/dL — ABNORMAL HIGH (ref <150)

## 2017-10-13 MED ORDER — LIP MEDEX EX OINT
TOPICAL_OINTMENT | CUTANEOUS | Status: AC
  Administered 2017-10-13: 12:00:00
  Filled 2017-10-13: qty 7

## 2017-10-13 MED ORDER — POTASSIUM CHLORIDE 10 MEQ/100ML IV SOLN
10.0000 meq | INTRAVENOUS | Status: AC
  Administered 2017-10-13 (×5): 10 meq via INTRAVENOUS
  Filled 2017-10-13 (×5): qty 100

## 2017-10-13 MED ORDER — ALBUMIN HUMAN 25 % IV SOLN
50.0000 g | Freq: Once | INTRAVENOUS | Status: AC
  Administered 2017-10-13: 50 g via INTRAVENOUS
  Filled 2017-10-13: qty 200

## 2017-10-13 MED ORDER — FAT EMULSION 20 % IV EMUL
240.0000 mL | INTRAVENOUS | Status: AC
Start: 2017-10-13 — End: 2017-10-14
  Administered 2017-10-13: 240 mL via INTRAVENOUS
  Filled 2017-10-13: qty 250

## 2017-10-13 MED ORDER — TRACE MINERALS CR-CU-MN-SE-ZN 10-1000-500-60 MCG/ML IV SOLN
INTRAVENOUS | Status: AC
  Administered 2017-10-13: 18:00:00 via INTRAVENOUS
  Filled 2017-10-13: qty 1800

## 2017-10-13 MED ORDER — POTASSIUM CHLORIDE 10 MEQ/50ML IV SOLN
10.0000 meq | INTRAVENOUS | Status: AC
  Administered 2017-10-13 – 2017-10-14 (×5): 10 meq via INTRAVENOUS
  Filled 2017-10-13 (×6): qty 50

## 2017-10-13 NOTE — Progress Notes (Signed)
[place] NOTE   Pharmacy Consult for TPN Indication:   Patient Measurements: Height: 5\' 2"  (157.5 cm) Weight: 127 lb 10.3 oz (57.9 kg) IBW/kg (Calculated) : 50.1 TPN AdjBW (KG): 60.8 Body mass index is 23.35 kg/m.  Insulin Requirements: none previous admission on TPN  Current Nutrition: NPO   IVF: D5 & 1/2 NS with 20 mEq KCL/L at 50 ml/hr  Central access:  TPN start date:   ASSESSMENT                                                                                                          HPI: 2 yoF re-admitted 1/29 with malignant ascites, requiring paracentesis.   Recent admit 1/1 to 1/25: new diagnosis peritoneal carcinomatosis, Ovarian Ca, L ureteral obstruction with stent, Bowel obstruction due to cecal mass/new colon Ca and begun on TPN - discharged home on TPN per [place] (aware of admission). G tube placed, meds given IV. First cycle chemo  Significant events:   Today:   Glucose - serum glucose 133, no CBGs ordered - not required last admit  Electrolytes - K 2.9, Corr Ca 10.3, Mag/Phos wnl  Renal - SCr decr slightly to 1.16  LFTs - sl elevated, not quite 2x ULN  TGs - 256 (1/30)  Prealbumin - 12.1 (1/30)  NUTRITIONAL GOALS                                                                                             RD recs: pending this admission Clinimix E 5/15 at a goal rate of 58ml/hr + 20% fat emulsion MWF at 61ml/hr to provide: 1484 kCal, 90gm protein per 24 hr  PLAN                                                                                                                        Potassium Chloride 10 mEq runs x 5 this am Recheck K level this afternoon, rebolus if needed  At 1800 today:  Continue Clinimix E 5/15 at 20ml/hr.  20% fat emulsion at 59ml/hr only Monday/Wednesday/Friday  TPN to contain standard multivitamins and trace elements.  IV fluid per  MD  TPN lab panels on Mondays &  Thursdays.  F/u daily.  Minda Ditto PharmD Pager [phone] 10/13/2017, 9:21 AM

## 2017-10-13 NOTE — Progress Notes (Signed)
Initial Nutrition Assessment  INTERVENTION:   TPN per Pharmacy Will continue to monitor  NUTRITION DIAGNOSIS:   Increased nutrient needs related to cancer and cancer related treatments, chronic illness as evidenced by estimated needs.  GOAL:   Patient will meet greater than or equal to 90% of their needs  MONITOR:   I & O's, Labs, Weight trends(TPN)  REASON FOR ASSESSMENT:   Consult New TPN/TNA  ASSESSMENT:   62 y.o. 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  Her ascites has reaccumulated and is more than ever and it has impacted her work of breathing.  Admitted for paracentesis.    Patient reports that she was receiving home TPN PTA and was tolerating it well. Spoke with Carolynn Sayers from [facility] who states pt was tolerating her TPN well with no issues. Goal will be to transition to cyclic TPN. Reviewed home TPN infusion, [facility] to provide RD with kcal and protein provided from home infusion.   Pt s/p paracentesis 1/29 which yielded 5.5L of fluid.  Pt reports eating some yogurt today. Pt was consuming mainly clear liquids at home.   Per Pharmacy note, pt currently receiving  Clinimix E 5/15 at 78ml/hr with 20% fat emulsion at 48ml/hr on M/W/F.   Per chart review, pt has lost 30 lb since 1/25, suspect this is related to fluid loss.   Medications: D5 and .45% NaCl w/ KCl infusion at 50 ml/hr- 204 kcal,  IV KCl Labs reviewed:  Low K Mg/Phos WNL GFR: 49 TG: 256  NUTRITION - FOCUSED PHYSICAL EXAM:    Most Recent Value  Orbital Region  Moderate depletion  Upper Arm Region  No depletion  Thoracic and Lumbar Region  Unable to assess  Buccal Region  No depletion  Temple Region  Moderate depletion  Clavicle Bone Region  Moderate depletion  Clavicle and Acromion Bone Region  Moderate depletion  Scapular Bone Region  No depletion  Dorsal Hand  No depletion  Patellar Region  No depletion  Anterior Thigh Region  No  depletion  Posterior Calf Region  No depletion  Edema (RD Assessment)  Mild       Diet Order:  Diet full liquid Room service appropriate? Yes; Fluid consistency: Thin .TPN (CLINIMIX-E) Adult TPN (CLINIMIX-E) Adult  EDUCATION NEEDS:   No education needs have been identified at this time  Skin:  Skin Assessment: Reviewed RN Assessment  Last BM:   PTA  Height:   Ht Readings from Last 1 Encounters:  10/12/17 5\' 2"  (1.575 m)    Weight:   Wt Readings from Last 1 Encounters:  10/13/17 127 lb 10.3 oz (57.9 kg)    Ideal Body Weight:  50 kg  BMI:  Body mass index is 23.35 kg/m.  Estimated Nutritional Needs:   Kcal:  1600-1800  Protein:  80-90g  Fluid:  1.8L/day  Clayton Bibles, MS, RD, LDN [place] Dietitian Pager: [phone] After Hours Pager: [phone]

## 2017-10-13 NOTE — Progress Notes (Signed)
Pharmacy Note re: Potassium  Patient received IV KCl 12mEq x5 runs today.  K+ improved 2.9>>3.2, but still below desired goal range.  Also has D51/2NS with 70mEq KCl ordered.    Plan:   Repeat IV KCL 71mEq x 5 more runs tonight.   Repeat Bmet in am *Consider increase KCl in IVF (max 23mEq/L)  Netta Cedars, PharmD, BCPS 10/13/2017@7 :06 PM

## 2017-10-13 NOTE — Progress Notes (Signed)
Patient ID: Christie Maynard, female   DOB: [birth year], 62 y.o.   MRN: [id number] [facility] Surgery Progress Note:   * No surgery found *  Subjective: Mental status is clear.  Up taking bath Objective: Vital signs in last 24 hours: Temp:  [98 F (36.7 C)-99 F (37.2 C)] 99 F (37.2 C) (01/30 0623) Pulse Rate:  [88-116] 90 (01/30 0623) Resp:  [20-36] 20 (01/30 0623) BP: (116-134)/(68-90) 116/68 (01/30 0623) SpO2:  [96 %-99 %] 98 % (01/30 0623) Weight:  [57.9 kg (127 lb 10.3 oz)-60.8 kg (134 lb 0.6 oz)] 57.9 kg (127 lb 10.3 oz) (01/30 0500)  Intake/Output from previous day: 01/29 0701 - 01/30 0700 In: 2300.8 [P.O.:1200; I.V.:1100.8] Out: 3825 [Urine:350; Drains:3475] Intake/Output this shift: No intake/output data recorded.  Physical Exam: Work of breathing is better after 5.5 liters of ascites withdrawn.    Lab Results:  Results for orders placed or performed during the hospital encounter of 10/12/17 (from the past 48 hour(s))  Comprehensive metabolic panel     Status: Abnormal   Collection Time: 10/12/17 10:22 AM  Result Value Ref Range   Sodium 144 135 - 145 mmol/L   Potassium 2.8 (L) 3.5 - 5.1 mmol/L   Chloride 101 101 - 111 mmol/L   CO2 31 22 - 32 mmol/L   Glucose, Bld 125 (H) 65 - 99 mg/dL   BUN 78 (H) 6 - 20 mg/dL   Creatinine, Ser 1.24 (H) 0.44 - 1.00 mg/dL   Calcium 8.7 (L) 8.9 - 10.3 mg/dL   Total Protein 6.2 (L) 6.5 - 8.1 g/dL   Albumin 1.4 (L) 3.5 - 5.0 g/dL   AST 72 (H) 15 - 41 U/L   ALT 88 (H) 14 - 54 U/L   Alkaline Phosphatase 181 (H) 38 - 126 U/L   Total Bilirubin 2.4 (H) 0.3 - 1.2 mg/dL   GFR calc non Af Amer 45 (L) >60 mL/min   GFR calc Af Amer 52 (L) >60 mL/min    Comment: (NOTE) The eGFR has been calculated using the CKD EPI equation. This calculation has not been validated in all clinical situations. eGFR's persistently <60 mL/min signify possible Chronic Kidney Disease.    Anion gap 12 5 - 15  CBC WITH DIFFERENTIAL     Status: Abnormal    Collection Time: 10/12/17 10:22 AM  Result Value Ref Range   WBC 6.2 4.0 - 10.5 K/uL   RBC 3.43 (L) 3.87 - 5.11 MIL/uL   Hemoglobin 9.6 (L) 12.0 - 15.0 g/dL   HCT 29.7 (L) 36.0 - 46.0 %   MCV 86.6 78.0 - 100.0 fL   MCH 28.0 26.0 - 34.0 pg   MCHC 32.3 30.0 - 36.0 g/dL   RDW 18.3 (H) 11.5 - 15.5 %   Platelets 217 150 - 400 K/uL   Neutrophils Relative % 89 %   Neutro Abs 5.5 1.7 - 7.7 K/uL   Lymphocytes Relative 11 %   Lymphs Abs 0.7 0.7 - 4.0 K/uL   Monocytes Relative 0 %   Monocytes Absolute 0.0 (L) 0.1 - 1.0 K/uL   Eosinophils Relative 0 %   Eosinophils Absolute 0.0 0.0 - 0.7 K/uL   Basophils Relative 0 %   Basophils Absolute 0.0 0.0 - 0.1 K/uL  Magnesium     Status: None   Collection Time: 10/12/17 10:22 AM  Result Value Ref Range   Magnesium 2.4 1.7 - 2.4 mg/dL  Phosphorus     Status: None   Collection Time:  10/12/17 10:22 AM  Result Value Ref Range   Phosphorus 4.0 2.5 - 4.6 mg/dL  Comprehensive metabolic panel     Status: Abnormal   Collection Time: 10/13/17  4:51 AM  Result Value Ref Range   Sodium 145 135 - 145 mmol/L   Potassium 2.9 (L) 3.5 - 5.1 mmol/L   Chloride 101 101 - 111 mmol/L   CO2 34 (H) 22 - 32 mmol/L   Glucose, Bld 133 (H) 65 - 99 mg/dL   BUN 69 (H) 6 - 20 mg/dL   Creatinine, Ser 1.16 (H) 0.44 - 1.00 mg/dL   Calcium 8.1 (L) 8.9 - 10.3 mg/dL   Total Protein 5.1 (L) 6.5 - 8.1 g/dL   Albumin 1.2 (L) 3.5 - 5.0 g/dL   AST 73 (H) 15 - 41 U/L   ALT 78 (H) 14 - 54 U/L   Alkaline Phosphatase 154 (H) 38 - 126 U/L   Total Bilirubin 2.2 (H) 0.3 - 1.2 mg/dL   GFR calc non Af Amer 49 (L) >60 mL/min   GFR calc Af Amer 57 (L) >60 mL/min    Comment: (NOTE) The eGFR has been calculated using the CKD EPI equation. This calculation has not been validated in all clinical situations. eGFR's persistently <60 mL/min signify possible Chronic Kidney Disease.    Anion gap 10 5 - 15  Magnesium     Status: None   Collection Time: 10/13/17  4:51 AM  Result Value Ref  Range   Magnesium 2.2 1.7 - 2.4 mg/dL  Phosphorus     Status: None   Collection Time: 10/13/17  4:51 AM  Result Value Ref Range   Phosphorus 3.7 2.5 - 4.6 mg/dL  Triglycerides     Status: Abnormal   Collection Time: 10/13/17  4:51 AM  Result Value Ref Range   Triglycerides 256 (H) <150 mg/dL  CBC     Status: Abnormal   Collection Time: 10/13/17  4:51 AM  Result Value Ref Range   WBC 5.6 4.0 - 10.5 K/uL   RBC 3.06 (L) 3.87 - 5.11 MIL/uL   Hemoglobin 8.7 (L) 12.0 - 15.0 g/dL   HCT 26.5 (L) 36.0 - 46.0 %   MCV 86.6 78.0 - 100.0 fL   MCH 28.4 26.0 - 34.0 pg   MCHC 32.8 30.0 - 36.0 g/dL   RDW 18.8 (H) 11.5 - 15.5 %   Platelets 177 150 - 400 K/uL  Differential     Status: None   Collection Time: 10/13/17  4:51 AM  Result Value Ref Range   Neutrophils Relative % 85 %   Neutro Abs 4.8 1.7 - 7.7 K/uL   Lymphocytes Relative 14 %   Lymphs Abs 0.8 0.7 - 4.0 K/uL   Monocytes Relative 1 %   Monocytes Absolute 0.1 0.1 - 1.0 K/uL   Eosinophils Relative 0 %   Eosinophils Absolute 0.0 0.0 - 0.7 K/uL   Basophils Relative 0 %   Basophils Absolute 0.0 0.0 - 0.1 K/uL    Radiology/Results: US Paracentesis  Result Date: 10/12/2017 INDICATION: Colon cancer, recurrent ascites. Request made for therapeutic paracentesis. EXAM: ULTRASOUND GUIDED THERAPEUTIC PARACENTESIS MEDICATIONS: None. COMPLICATIONS: None immediate. PROCEDURE: Informed written consent was obtained from the patient after a discussion of the risks, benefits and alternatives to treatment. A timeout was performed prior to the initiation of the procedure. Initial ultrasound scanning demonstrates a large amount of ascites within the right mid to lower abdominal quadrant. The right mid to lower abdomen was prepped and  draped in the usual sterile fashion. 1% lidocaine was used for local anesthesia. Following this, a Yueh catheter was introduced. An ultrasound image was saved for documentation purposes. The paracentesis was performed. The  catheter was removed and a dressing was applied. The patient tolerated the procedure well without immediate post procedural complication. FINDINGS: A total of approximately 5.5 liters of golden yellow fluid was removed. IMPRESSION: Successful ultrasound-guided therapeutic paracentesis yielding 5.5 liters of peritoneal fluid. Read by: Rowe Robert, PA-C Electronically Signed   By: Aletta Edouard M.D.   On: 10/12/2017 13:11    Anti-infectives: Anti-infectives (From admission, onward)   None      Assessment/Plan: Problem List: Patient Active Problem List   Diagnosis Date Noted  . Malignant ascites 10/12/2017  . Colon cancer ([place]) 10/05/2017  . Leukocytosis 09/25/2017  . SBO (small bowel obstruction) ([place]) 09/22/2017  . Colonic mass 09/22/2017  . Dehydration   . Neoplasm of colon, malignant ([place])   . Nausea and vomiting 09/15/2017  . Nausea & vomiting 09/14/2017  . 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 History of seizures 09/14/2017  . Protein-calorie malnutrition, severe ([place]) 09/08/2017  . Ovarian mass 09/05/2017  . Other ascites 09/03/2017  . Ascites 09/03/2017    Improved.  Hypokalemia to be addressed by additions to TNA.  Not ready for discharge yet.   * No surgery found *    LOS: 1 day   Matt B. Hassell Done, MD, [facility] [phone] beeper [phone]  10/13/2017 8:13 AM

## 2017-10-14 DIAGNOSIS — Z803 Family history of malignant neoplasm of breast: Secondary | ICD-10-CM

## 2017-10-14 DIAGNOSIS — C180 Malignant neoplasm of cecum: Secondary | ICD-10-CM

## 2017-10-14 DIAGNOSIS — R140 Abdominal distension (gaseous): Secondary | ICD-10-CM

## 2017-10-14 DIAGNOSIS — N19 Unspecified kidney failure: Secondary | ICD-10-CM

## 2017-10-14 DIAGNOSIS — Z931 Gastrostomy status: Secondary | ICD-10-CM

## 2017-10-14 DIAGNOSIS — C786 Secondary malignant neoplasm of retroperitoneum and peritoneum: Principal | ICD-10-CM

## 2017-10-14 DIAGNOSIS — N1330 Unspecified hydronephrosis: Secondary | ICD-10-CM

## 2017-10-14 LAB — COMPREHENSIVE METABOLIC PANEL
ALBUMIN: 2 g/dL — AB (ref 3.5–5.0)
ALK PHOS: 124 U/L (ref 38–126)
ALT: 53 U/L (ref 14–54)
ANION GAP: 8 (ref 5–15)
AST: 46 U/L — AB (ref 15–41)
BILIRUBIN TOTAL: 2 mg/dL — AB (ref 0.3–1.2)
BUN: 56 mg/dL — ABNORMAL HIGH (ref 6–20)
CALCIUM: 8.2 mg/dL — AB (ref 8.9–10.3)
CO2: 30 mmol/L (ref 22–32)
Chloride: 102 mmol/L (ref 101–111)
Creatinine, Ser: 0.99 mg/dL (ref 0.44–1.00)
GFR calc Af Amer: >60 mL/min (ref >60)
GFR calc non Af Amer: 59 mL/min — ABNORMAL LOW (ref >60)
GLUCOSE: 131 mg/dL — AB (ref 65–99)
Potassium: 3.5 mmol/L (ref 3.5–5.1)
SODIUM: 140 mmol/L (ref 135–145)
TOTAL PROTEIN: 5.3 g/dL — AB (ref 6.5–8.1)

## 2017-10-14 LAB — MAGNESIUM: Magnesium: 2.2 mg/dL (ref 1.7–2.4)

## 2017-10-14 LAB — PHOSPHORUS: Phosphorus: 2.5 mg/dL (ref 2.5–4.6)

## 2017-10-14 MED ORDER — ALBUMIN HUMAN 25 % IV SOLN
50.0000 g | Freq: Once | INTRAVENOUS | Status: AC
  Administered 2017-10-14: 50 g via INTRAVENOUS
  Filled 2017-10-14: qty 200

## 2017-10-14 MED ORDER — KCL IN DEXTROSE-NACL 40-5-0.45 MEQ/L-%-% IV SOLN
INTRAVENOUS | Status: DC
  Administered 2017-10-14: 23:00:00 via INTRAVENOUS
  Filled 2017-10-14: qty 1000

## 2017-10-14 MED ORDER — POTASSIUM PHOSPHATES 15 MMOLE/5ML IV SOLN
20.0000 mmol | Freq: Once | INTRAVENOUS | Status: AC
  Administered 2017-10-14: 20 mmol via INTRAVENOUS
  Filled 2017-10-14: qty 6.67

## 2017-10-14 MED ORDER — TRACE MINERALS CR-CU-MN-SE-ZN 10-1000-500-60 MCG/ML IV SOLN
INTRAVENOUS | Status: DC
  Administered 2017-10-14: 18:00:00 via INTRAVENOUS
  Filled 2017-10-14: qty 1800

## 2017-10-14 NOTE — Progress Notes (Signed)
Patient ID: Christie Maynard, female   DOB: Dec 15, 1955, 62 y.o.   MRN: [id number] [facility] Surgery Progress Note:   * No surgery found *  Subjective: Mental status is clear.  Feeling better but feeling some reaccumulation of fluid Objective: Vital signs in last 24 hours: Temp:  [98.4 F (36.9 C)-98.5 F (36.9 C)] 98.4 F (36.9 C) (01/31 0523) Pulse Rate:  [65-94] 94 (01/31 0523) Resp:  [18] 18 (01/31 0523) BP: (105-123)/(67-72) 109/67 (01/31 0523) SpO2:  [97 %-99 %] 97 % (01/31 0523) Weight:  [62.1 kg (136 lb 14.5 oz)] 62.1 kg (136 lb 14.5 oz) (01/31 0523)  Intake/Output from previous day: 01/30 0701 - 01/31 0700 In: 2735 [P.O.:240; I.V.:1995; IV Piggyback:500] Out: 5200 [QMVHQ:4696; Drains:3250] Intake/Output this shift: Total I/O In: -  Out: 300 [Urine:300]  Physical Exam: Work of breathing is not labored.    Lab Results:  Results for orders placed or performed during the hospital encounter of 10/12/17 (from the past 48 hour(s))  Comprehensive metabolic panel     Status: Abnormal   Collection Time: 10/13/17  4:51 AM  Result Value Ref Range   Sodium 145 135 - 145 mmol/L   Potassium 2.9 (L) 3.5 - 5.1 mmol/L   Chloride 101 101 - 111 mmol/L   CO2 34 (H) 22 - 32 mmol/L   Glucose, Bld 133 (H) 65 - 99 mg/dL   BUN 69 (H) 6 - 20 mg/dL   Creatinine, Ser 1.16 (H) 0.44 - 1.00 mg/dL   Calcium 8.1 (L) 8.9 - 10.3 mg/dL   Total Protein 5.1 (L) 6.5 - 8.1 g/dL   Albumin 1.2 (L) 3.5 - 5.0 g/dL   AST 73 (H) 15 - 41 U/L   ALT 78 (H) 14 - 54 U/L   Alkaline Phosphatase 154 (H) 38 - 126 U/L   Total Bilirubin 2.2 (H) 0.3 - 1.2 mg/dL   GFR calc non Af Amer 49 (L) >60 mL/min   GFR calc Af Amer 57 (L) >60 mL/min    Comment: (NOTE) The eGFR has been calculated using the CKD EPI equation. This calculation has not been validated in all clinical situations. eGFR's persistently <60 mL/min signify possible Chronic Kidney Disease.    Anion gap 10 5 - 15  Prealbumin     Status: Abnormal    Collection Time: 10/13/17  4:51 AM  Result Value Ref Range   Prealbumin 12.1 (L) 18 - 38 mg/dL    Comment: Performed at [facility] [address], [place], [place] [address]  Magnesium     Status: None   Collection Time: 10/13/17  4:51 AM  Result Value Ref Range   Magnesium 2.2 1.7 - 2.4 mg/dL  Phosphorus     Status: None   Collection Time: 10/13/17  4:51 AM  Result Value Ref Range   Phosphorus 3.7 2.5 - 4.6 mg/dL  Triglycerides     Status: Abnormal   Collection Time: 10/13/17  4:51 AM  Result Value Ref Range   Triglycerides 256 (H) <150 mg/dL  CBC     Status: Abnormal   Collection Time: 10/13/17  4:51 AM  Result Value Ref Range   WBC 5.6 4.0 - 10.5 K/uL   RBC 3.06 (L) 3.87 - 5.11 MIL/uL   Hemoglobin 8.7 (L) 12.0 - 15.0 g/dL   HCT 26.5 (L) 36.0 - 46.0 %   MCV 86.6 78.0 - 100.0 fL   MCH 28.4 26.0 - 34.0 pg   MCHC 32.8 30.0 - 36.0  g/dL   RDW 18.8 (H) 11.5 - 15.5 %   Platelets 177 150 - 400 K/uL  Differential     Status: None   Collection Time: 10/13/17  4:51 AM  Result Value Ref Range   Neutrophils Relative % 85 %   Neutro Abs 4.8 1.7 - 7.7 K/uL   Lymphocytes Relative 14 %   Lymphs Abs 0.8 0.7 - 4.0 K/uL   Monocytes Relative 1 %   Monocytes Absolute 0.1 0.1 - 1.0 K/uL   Eosinophils Relative 0 %   Eosinophils Absolute 0.0 0.0 - 0.7 K/uL   Basophils Relative 0 %   Basophils Absolute 0.0 0.0 - 0.1 K/uL  Potassium     Status: Abnormal   Collection Time: 10/13/17  5:50 PM  Result Value Ref Range   Potassium 3.2 (L) 3.5 - 5.1 mmol/L  Comprehensive metabolic panel     Status: Abnormal   Collection Time: 10/14/17  4:17 AM  Result Value Ref Range   Sodium 140 135 - 145 mmol/L   Potassium 3.5 3.5 - 5.1 mmol/L   Chloride 102 101 - 111 mmol/L   CO2 30 22 - 32 mmol/L   Glucose, Bld 131 (H) 65 - 99 mg/dL   BUN 56 (H) 6 - 20 mg/dL   Creatinine, Ser 0.99 0.44 - 1.00 mg/dL   Calcium 8.2 (L) 8.9 - 10.3 mg/dL   Total Protein 5.3 (L) 6.5 - 8.1 g/dL   Albumin 2.0 (L) 3.5 -  5.0 g/dL   AST 46 (H) 15 - 41 U/L   ALT 53 14 - 54 U/L   Alkaline Phosphatase 124 38 - 126 U/L   Total Bilirubin 2.0 (H) 0.3 - 1.2 mg/dL   GFR calc non Af Amer 59 (L) >60 mL/min   GFR calc Af Amer >60 >60 mL/min    Comment: (NOTE) The eGFR has been calculated using the CKD EPI equation. This calculation has not been validated in all clinical situations. eGFR's persistently <60 mL/min signify possible Chronic Kidney Disease.    Anion gap 8 5 - 15  Magnesium     Status: None   Collection Time: 10/14/17  4:17 AM  Result Value Ref Range   Magnesium 2.2 1.7 - 2.4 mg/dL  Phosphorus     Status: None   Collection Time: 10/14/17  4:17 AM  Result Value Ref Range   Phosphorus 2.5 2.5 - 4.6 mg/dL    Radiology/Results: US Paracentesis  Result Date: 10/12/2017 INDICATION: Colon cancer, recurrent ascites. Request made for therapeutic paracentesis. EXAM: ULTRASOUND GUIDED THERAPEUTIC PARACENTESIS MEDICATIONS: None. COMPLICATIONS: None immediate. PROCEDURE: Informed written consent was obtained from the patient after a discussion of the risks, benefits and alternatives to treatment. A timeout was performed prior to the initiation of the procedure. Initial ultrasound scanning demonstrates a large amount of ascites within the right mid to lower abdominal quadrant. The right mid to lower abdomen was prepped and draped in the usual sterile fashion. 1% lidocaine was used for local anesthesia. Following this, a Yueh catheter was introduced. An ultrasound image was saved for documentation purposes. The paracentesis was performed. The catheter was removed and a dressing was applied. The patient tolerated the procedure well without immediate post procedural complication. FINDINGS: A total of approximately 5.5 liters of golden yellow fluid was removed. IMPRESSION: Successful ultrasound-guided therapeutic paracentesis yielding 5.5 liters of peritoneal fluid. Read by: Rowe Robert, PA-C Electronically Signed   By:  Aletta Edouard M.D.   On: 10/12/2017 13:11  Anti-infectives: Anti-infectives (From admission, onward)   None      Assessment/Plan: Problem List: Patient Active Problem List   Diagnosis Date Noted  . Malignant ascites 10/12/2017  . Colon cancer ([place]) 10/05/2017  . Leukocytosis 09/25/2017  . SBO (small bowel obstruction) ([place]) 09/22/2017  . Colonic mass 09/22/2017  . Dehydration   . Neoplasm of colon, malignant ([place])   . Nausea and vomiting 09/15/2017  . Nausea & vomiting 09/14/2017  . 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 History of seizures 09/14/2017  . Protein-calorie malnutrition, severe ([place]) 09/08/2017  . Ovarian mass 09/05/2017  . Other ascites 09/03/2017  . Ascites 09/03/2017    Her K is up to 3.5 and her albumin is up as well.  Will give another albumin infusion and plan discharge in the morning.   * No surgery found *    LOS: 2 days   Matt B. Hassell Done, MD, [facility] [phone] beeper [phone]  10/14/2017 11:47 AM

## 2017-10-14 NOTE — Progress Notes (Signed)
IP PROGRESS NOTE  Subjective:   Christie Maynard was discharged 10/08/2017 after completing cycle 1 FOLFOX.  She reports no nausea, mouth sores, or neuropathy symptoms following chemotherapy.  She was readmitted 10/12/2017 with severe abdominal distention.  She underwent a paracentesis 10/12/2017 for 5.5 L of fluid.  She reports feeling better following the paracentesis. She reports the fluid has partially reaccumulated.  She complains of malaise.  The gastrostomy tube remains open to drainage.  Objective: Vital signs in last 24 hours: Blood pressure 103/67, pulse 93, temperature 98.8 F (37.1 C), temperature source Oral, resp. rate 18, height 5' 2"  (1.575 m), weight 136 lb 14.5 oz (62.1 kg), SpO2 99 %.  Intake/Output from previous day: 01/30 0701 - 01/31 0700 In: 2735 [P.O.:240; I.V.:1995; IV Piggyback:500] Out: 5200 [Urine:1950; Drains:3250]  Physical Exam:  HEENT: No thrush or ulcers Lungs: Clear anteriorly Cardiac: Regular rate and rhythm Abdomen: Mildly distended, healed midline incision, left upper quadrant gastrostomy tube Extremities: Pitting edema at the lower leg bilaterally she is not passing flatus.  No bowel movement.  Portacath/PICC-without erythema  Lab Results: Recent Labs    10/12/17 1022 10/13/17 0451  WBC 6.2 5.6  HGB 9.6* 8.7*  HCT 29.7* 26.5*  PLT 217 177    BMET Recent Labs    10/13/17 0451 10/13/17 1750 10/14/17 0417  NA 145  --  140  K 2.9* 3.2* 3.5  CL 101  --  102  CO2 34*  --  30  GLUCOSE 133*  --  131*  BUN 69*  --  56*  CREATININE 1.16*  --  0.99  CALCIUM 8.1*  --  8.2*    Lab Results  Component Value Date   CEA1 2,751.0 (H) 09/16/2017    Studies/Results: No results found.  Medications: I have reviewed the patient's current medications.  Assessment/Plan:  1. Metastatic colon cancer  Initial presentation with nausea and bloating.   CT abdomen/pelvis 09/03/2017 showed a multilobulated irregular heterogeneous enhancing soft tissue  mass withinthe pelvis either involving both ovaries or a single mass arising from the right and extending across the midline with components measuring 10.6x 4.9x 9.8 cm and 8.2 x 4.9 x 4.8 cm. There was associated ascites and omental caking consistent with peritoneal carcinomatosis. A distal small bowel obstruction was suspected. There was distal left ureteral obstruction with left hydronephrosis and hydroureter.   Ultrasound paracentesis was performed 09/04/2017. Atypical cells were present, suspicious for adenocarcinoma of the GI tract.   Paracentesis 09/13/2017 with atypical cells present.  CT 09/14/2017 showed a progressing small bowel obstruction with diffusely dilated fluid-filled small bowel. Transition zone in the right lower quadrant. 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 Right lower quadrant mass measuring 4.4 cm.   CT biopsy of a peritoneal mass 09/17/2017 with pathology showing adenocarcinoma with extracellular mucin and signet ring cell features most consistent with a GI primary;positive for CDX-2,cytokeratin 20 and p53. Nonspecific staining for PAX-8. Negative for estrogen receptor, progesterone receptor and cytokeratin 7.  Colonoscopy 09/22/2017-cecum mass, biopsy confirmed adenocarcinoma with signet cell features  Exploratory laparotomy 09/23/2017- fixed pelvic and cecum masses, carcinomatosis with tumor implants at the abdominal wall, bowel, and omentum, status post a palliative small bowel to right colon bypass and placement of a gastrostomy tube  Cycle 1 FOLFOX 10/06/2017 2. Small bowel obstructionsecondary to carcinomatosis and a right cecum tumor, status post palliative small bowel to right colon bypass and placement  of gastrostomy tube 09/23/2017 3. Distal left ureteral obstruction with left hydronephrosis and hydroureter  status post left ureteral stent placement 09/05/2017. 4. Seizure 1983. 5. Significant family history for breast cancer. 6. Renal failure-ultrasound 10/01/2017, mild right and moderate left hydronephrosis 7. Admission 10/12/2017 with progressive abdominal distention.  Paracentesis 10/12/2017   Christie Maynard is now at day 9 following cycle 1 FOLFOX.  She tolerated the FOLFOX   Without significant acute toxicity. The neutrophil count and platelets are adequate.  She was admitted with increased abdominal distention and underwent a therapeutic paracentesis.  Unfortunately she continues to have obstructive symptoms.  This is not improved following the first cycle of chemotherapy.  She is scheduled for follow-up at the Cancer center and cycle 2 chemotherapy 10/20/2017.  Recommendations: 1.  Repeat therapeutic paracentesis prior to discharge if there is further accumulation of fluid  on 10/15/2017 2.  Continue TPN  3.  Follow-up as scheduled at the Cancer center for an office visit and cycle 2 FOLFOX 10/20/2017.     LOS: 2 days   Betsy Coder, MD   10/14/2017, 7:48 PM

## 2017-10-14 NOTE — Progress Notes (Signed)
[facility] CONSULT NOTE   Pharmacy Consult for TPN Indication:   Patient Measurements: Height: 5\' 2"  (157.5 cm) Weight: 136 lb 14.5 oz (62.1 kg) IBW/kg (Calculated) : 50.1 TPN AdjBW (KG): 60.8 Body mass index is 25.04 kg/m.  Insulin Requirements: none needed per previous admission on TPN  Current Nutrition: NPO   IVF: D5 & 1/2 NS with 40 mEq KCL/L at 50 ml/hr  Central access:  TPN start date:   ASSESSMENT                                                                                                          HPI: 12 yoF re-admitted 1/29 with malignant ascites, requiring paracentesis.   Recent admit 1/1 to 1/25: new diagnosis peritoneal carcinomatosis, Ovarian Ca, L ureteral obstruction with stent, Bowel obstruction due to cecal mass/new colon Ca and begun on TPN - discharged home on TPN per [place] (aware of admission). G tube placed, meds given IV. First cycle chemo  Significant events:   Today:   Glucose - serum glucose 131, no CBGs ordered - not required last admit  Electrolytes - K 3.5 - required 10 runs KCL, Corr Ca 10.3, Mag/Phos wnl (Phos at low end of normal)  Renal - SCr now wnl  LFTs - sl elevated, not quite 2x ULN  TGs - 256 (1/30)  Prealbumin - 12.1 (1/30)  NUTRITIONAL GOALS                                                                                             RD recs: pending this admission Clinimix E 5/15 at a goal rate of 92ml/hr + 20% fat emulsion MWF at 10ml/hr to provide: 1484 kCal, 90gm protein per 24 hr  PLAN                                                                                                                        K Phos bolus 20 mMol this am  At 1800 today:  Continue Clinimix E 5/15 at 58ml/hr.  20% fat emulsion at 22ml/hr only Monday/Wednesday/Friday  TPN to contain standard multivitamins and trace elements.  IV fluid:  increased K to 40 mEq/L   TPN lab panels on Mondays &  Thursdays.  BMET am  F/u daily.  Minda Ditto PharmD Pager [phone] 10/14/2017, 10:08 AM

## 2017-10-15 LAB — COMPREHENSIVE METABOLIC PANEL
ALK PHOS: 117 U/L (ref 38–126)
ALT: 46 U/L (ref 14–54)
AST: 42 U/L — ABNORMAL HIGH (ref 15–41)
Albumin: 2.5 g/dL — ABNORMAL LOW (ref 3.5–5.0)
Anion gap: 9 (ref 5–15)
BILIRUBIN TOTAL: 3 mg/dL — AB (ref 0.3–1.2)
BUN: 49 mg/dL — ABNORMAL HIGH (ref 6–20)
CALCIUM: 8.3 mg/dL — AB (ref 8.9–10.3)
CO2: 30 mmol/L (ref 22–32)
Chloride: 100 mmol/L — ABNORMAL LOW (ref 101–111)
Creatinine, Ser: 0.84 mg/dL (ref 0.44–1.00)
Glucose, Bld: 120 mg/dL — ABNORMAL HIGH (ref 65–99)
Potassium: 3.2 mmol/L — ABNORMAL LOW (ref 3.5–5.1)
SODIUM: 139 mmol/L (ref 135–145)
TOTAL PROTEIN: 5.5 g/dL — AB (ref 6.5–8.1)

## 2017-10-15 MED ORDER — HYDROCODONE-ACETAMINOPHEN 5-325 MG PO TABS
1.0000 | ORAL_TABLET | ORAL | 0 refills | Status: AC | PRN
Start: 2017-10-15 — End: ?

## 2017-10-15 MED ORDER — HEPARIN SOD (PORK) LOCK FLUSH 100 UNIT/ML IV SOLN
250.0000 [IU] | INTRAVENOUS | Status: AC | PRN
  Administered 2017-10-15: 250 [IU]

## 2017-10-15 NOTE — Progress Notes (Signed)
Discharge instructions reviewed with patient. All questions answered. Patient wheeled to vehicle with belongings by nurse tech. 

## 2017-10-15 NOTE — Discharge Summary (Signed)
Physician Discharge Summary  Patient ID: Christie Maynard MRN: [id number] DOB/AGE: November 14, 1955 62 y.o.  Admit date: 10/12/2017 Discharge date: 10/15/2017  Admission Diagnoses:  Peritoneal carcinomatosis with malignant ascites  Discharge Diagnoses:  same  Principal Problem:   Malignant ascites   Surgery:  none  Discharged Condition: improved  Hospital Course:   Patient admitted with SOB and increasing ascites.  5.5 liters of ascitic fluid was removed.  She was given albumin and potassium IV  Consults: Dr. Benay Spice  Significant Diagnostic Studies: ultrasound aspiration of abdominal ascites    Discharge Exam: Blood pressure 102/64, pulse 92, temperature 100.2 F (37.9 C), temperature source Oral, resp. rate 18, height 5\' 2"  (1.575 m), weight 58.3 kg (128 lb 8.5 oz), SpO2 98 %. Incision healed  Disposition: 01-Home or Self Care  Discharge Instructions    Increase activity slowly   Complete by:  As directed      Allergies as of 10/15/2017   No Known Allergies     Medication List    TAKE these medications   HYDROcodone-acetaminophen 5-325 MG tablet Commonly known as:  NORCO/VICODIN Take 1-2 tablets by mouth every 4 (four) hours as needed for moderate pain.   phenytoin 50 MG/ML injection Commonly known as:  DILANTIN Inject 2 mLs (100 mg total) into the vein at bedtime.        Signed: Pedro Earls 10/15/2017, 10:40 AM

## 2017-10-16 ENCOUNTER — Other Ambulatory Visit: Payer: Self-pay | Admitting: Oncology

## 2017-10-16 DIAGNOSIS — C189 Malignant neoplasm of colon, unspecified: Secondary | ICD-10-CM

## 2017-10-17 ENCOUNTER — Other Ambulatory Visit: Payer: Self-pay | Admitting: Oncology

## 2017-10-18 ENCOUNTER — Telehealth: Payer: Self-pay | Admitting: *Deleted

## 2017-10-18 NOTE — Telephone Encounter (Signed)
Message from pt's husband asking if she needs to have paracentesis done on 2/5. He reports they are measuring abdominal girth and it has not changed (~91cm). Reviewed with Dr. Benay Spice: We can schedule for later in the week if needed. They plan to keep 2/6 office and chemo appts. Informed him we can reschedule procedure then. He voiced appreciation for return call.

## 2017-10-19 ENCOUNTER — Telehealth: Payer: Self-pay | Admitting: *Deleted

## 2017-10-19 ENCOUNTER — Ambulatory Visit (HOSPITAL_COMMUNITY): Payer: BLUE CROSS/BLUE SHIELD

## 2017-10-19 ENCOUNTER — Encounter (HOSPITAL_COMMUNITY): Payer: Self-pay | Admitting: *Deleted

## 2017-10-19 ENCOUNTER — Emergency Department (HOSPITAL_COMMUNITY): Payer: BLUE CROSS/BLUE SHIELD

## 2017-10-19 ENCOUNTER — Emergency Department (HOSPITAL_COMMUNITY)
Admission: EM | Admit: 2017-10-19 | Discharge: 2017-11-12 | Disposition: E | Payer: BLUE CROSS/BLUE SHIELD | Attending: Emergency Medicine | Admitting: Emergency Medicine

## 2017-10-19 DIAGNOSIS — R4182 Altered mental status, unspecified: Secondary | ICD-10-CM | POA: Diagnosis not present

## 2017-10-19 DIAGNOSIS — Z8543 Personal history of malignant neoplasm of ovary: Secondary | ICD-10-CM | POA: Diagnosis not present

## 2017-10-19 DIAGNOSIS — R509 Fever, unspecified: Secondary | ICD-10-CM | POA: Insufficient documentation

## 2017-10-19 DIAGNOSIS — I959 Hypotension, unspecified: Secondary | ICD-10-CM | POA: Diagnosis not present

## 2017-10-19 DIAGNOSIS — R0682 Tachypnea, not elsewhere classified: Secondary | ICD-10-CM | POA: Diagnosis not present

## 2017-10-19 DIAGNOSIS — R55 Syncope and collapse: Secondary | ICD-10-CM | POA: Diagnosis present

## 2017-10-19 DIAGNOSIS — Z79899 Other long term (current) drug therapy: Secondary | ICD-10-CM | POA: Insufficient documentation

## 2017-10-19 DIAGNOSIS — C189 Malignant neoplasm of colon, unspecified: Secondary | ICD-10-CM | POA: Diagnosis not present

## 2017-10-19 MED ORDER — PIPERACILLIN-TAZOBACTAM 3.375 G IVPB 30 MIN: 3.3750 g | Freq: Once | INTRAVENOUS | Status: DC

## 2017-10-19 MED ORDER — VANCOMYCIN HCL IN DEXTROSE 1-5 GM/200ML-% IV SOLN: 1000.0000 mg | Freq: Once | INTRAVENOUS | Status: DC

## 2017-10-19 MED ORDER — ACETAMINOPHEN 650 MG RE SUPP
650.0000 mg | Freq: Once | RECTAL | Status: AC
  Administered 2017-10-19: 650 mg via RECTAL

## 2017-10-19 MED ORDER — SODIUM CHLORIDE 0.9 % IV BOLUS (SEPSIS): 1000.0000 mL | Freq: Once | INTRAVENOUS | Status: DC

## 2017-10-20 ENCOUNTER — Inpatient Hospital Stay: Payer: BLUE CROSS/BLUE SHIELD | Admitting: Nurse Practitioner

## 2017-10-20 ENCOUNTER — Other Ambulatory Visit: Payer: BLUE CROSS/BLUE SHIELD

## 2017-10-20 ENCOUNTER — Ambulatory Visit: Payer: BLUE CROSS/BLUE SHIELD

## 2017-10-21 MED FILL — Medication: Qty: 1 | Status: AC

## 2017-10-22 ENCOUNTER — Encounter (HOSPITAL_COMMUNITY): Payer: Self-pay

## 2017-11-12 NOTE — ED Provider Notes (Signed)
[facility] DEPARTMENT Provider Note   CSN: [id number] Arrival date & time: 2017/10/25  0403     History   Chief Complaint Chief Complaint  Patient presents with  . Altered Mental Status    HPI Christie Maynard is a 62 y.o. female.  The history is provided by the spouse and the EMS personnel. The history is limited by the condition of the patient (Unresponsive).  She has history of ovarian cancer and colon cancer as well as seizures and was brought in by EMS after having a syncopal episode at home.  Husband states that she felt that she had to urinate and he tried to assist her to the commode but she collapsed.  She regained consciousness and stated that she still had to go to the restroom, but collapsed again when her husband tried to move her a second time.  EMS was called.  EMS noted patient was severely hypotensive, but alert and oriented.  She passed out again, began vomiting, and became hypotensive.  Intraosseous line was inserted and fluids administered.  In route, they were unable to obtain any peripheral pulses.  Past Medical History:  Diagnosis Date  . Cancer ([place])   . Ovarian ca ([place])   . Seizures ([place])   . UTI (urinary tract infection)     Patient Active Problem List   Diagnosis Date Noted  . Malignant ascites 10/12/2017  . Colon cancer ([place]) 10/05/2017  . Leukocytosis 09/25/2017  . SBO (small bowel obstruction) ([place]) 09/22/2017  . Colonic mass 09/22/2017  . Dehydration   . Neoplasm of colon, malignant ([place])   . Nausea and vomiting 09/15/2017  . Nausea & vomiting 09/14/2017  . 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 History of seizures 09/14/2017  . Protein-calorie malnutrition, severe ([place]) 09/08/2017  . Ovarian mass 09/05/2017  . Other ascites 09/03/2017  . Ascites 09/03/2017    Past Surgical History:  Procedure Laterality Date  . COLONOSCOPY WITH PROPOFOL N/A 09/22/2017   Procedure: COLONOSCOPY WITH PROPOFOL;  Surgeon:  Ronald Lobo, MD;  Location: [facility];  Service: Endoscopy;  Laterality: N/A;  The patient will have a tap water enema prep because she is recovering from bowel obstruction  . CYSTOSCOPY W/ URETERAL STENT PLACEMENT Left 09/05/2017   Procedure: CYSTOSCOPY WITH LEFT URETERAL STENT REPLACEMENT;  Surgeon: Raynelle Bring, MD;  Location: [facility];  Service: Urology;  Laterality: Left;  . ESOPHAGOGASTRODUODENOSCOPY (EGD) WITH PROPOFOL N/A 09/22/2017   Procedure: ESOPHAGOGASTRODUODENOSCOPY (EGD) WITH PROPOFOL;  Surgeon: Ronald Lobo, MD;  Location: [facility];  Service: Endoscopy;  Laterality: N/A;  Leave NG in place, please use PEDIATRIC UPPER endoscope  . IR PARACENTESIS  09/13/2017  . LAPAROTOMY N/A 09/23/2017   Procedure: EXPLORATORY LAPAROTOMY WITH PALLIATIVE SMALL BOWEL TO RIGHT COLON BY PASS WITH PLACEMENT OF GASTROSTOMY TUBE;  Surgeon: Jovita Kussmaul, MD;  Location: [facility];  Service: General;  Laterality: N/A;  . NO PAST SURGERIES      OB History    No data available       Home Medications    Prior to Admission medications   Medication Sig Start Date End Date Taking? Authorizing Provider  HYDROcodone-acetaminophen (NORCO/VICODIN) 5-325 MG tablet Take 1-2 tablets by mouth every 4 (four) hours as needed for moderate pain. 10/15/17   Johnathan Hausen, MD  phenytoin (DILANTIN) 50 MG/ML injection Inject 2 mLs (100 mg total) into the vein at bedtime. 10/08/17 11/07/17  Marene Lenz, MD    Family History Family History  Problem Relation Age of Onset  . Breast cancer Mother   . Lung cancer Father   . Breast cancer Sister   . Breast cancer Maternal Aunt   . Breast cancer Paternal Aunt   . Cancer Paternal Uncle     Social History Social History   Tobacco Use  . Smoking status: Never Smoker  . Smokeless tobacco: Never Used  Substance Use Topics  . Alcohol use: No    Frequency: Never  . Drug use: No     Allergies   Patient has no known allergies.   Review of  Systems Review of Systems  Unable to perform ROS: Patient unresponsive     Physical Exam Updated Vital Signs Pulse 98   Temp (!) 104.2 F (40.1 C) (Rectal)   Resp (!) 30   Wt 58.1 kg (128 lb)   SpO2 95%   BMI 23.41 kg/m   Physical Exam  Nursing note and vitals reviewed.  Critically ill 62 year old female. Vital signs are significant for fever, tachypnea, hypotension. Oxygen saturation is 95%, which is normal. Head is normocephalic and atraumatic. PERRLA, EOMI. Oropharynx is clear. Neck is nontender and supple without adenopathy or JVD. Back is nontender and there is no CVA tenderness. Lungs are clear without rales, wheezes, or rhonchi. Chest is nontender. Heart has regular rate and rhythm without murmur. Abdomen: Midline surgical scar is present and healing well.  Drainage tube from the left upper quadrant present.. Extremities have no cyanosis or edema. Skin is warm and dry without rash. Neurologic: She is unresponsive but with spontaneous respirations.  No voluntary movement noted other than respirations.  ED Treatments / Results  Labs (all labs ordered are listed, but only abnormal results are displayed) Labs Reviewed - No data to display  EKG  EKG Interpretation  Date/Time:  2017-10-26 04:08:56 EST Ventricular Rate:  96 PR Interval:    QRS Duration: 76 QT Interval:  346 QTC Calculation: 438 R Axis:   16 Text Interpretation:  Sinus rhythm Short PR interval When compared with ECG of 09/14/2017, No significant change was found Confirmed by Delora Fuel ([id number]) on October 26, 2017 6:02:12 AM       Procedures Procedures  CRITICAL CARE Performed by: Delora Fuel Total critical care time: 35 minutes Critical care time was exclusive of separately billable procedures and treating other patients. Critical care was necessary to treat or prevent imminent or life-threatening deterioration. Critical care was time spent personally by me on the following activities:  development of treatment plan with patient and/or surrogate as well as nursing, discussions with consultants, evaluation of patient's response to treatment, examination of patient, obtaining history from patient or surrogate, ordering and performing treatments and interventions, ordering and review of laboratory studies, ordering and review of radiographic studies, pulse oximetry and re-evaluation of patient's condition.  Cardiopulmonary Resuscitation (CPR) Procedure Note Directed/Performed by: Delora Fuel I personally directed ancillary staff and/or performed CPR in an effort to regain return of spontaneous circulation and to maintain cardiac, neuro and systemic perfusion.    Medications Ordered in ED Medications  acetaminophen (TYLENOL) suppository 650 mg (650 mg Rectal Given October 26, 2017 0422)     Initial Impression / Assessment and Plan / ED Course  I have reviewed the triage vital signs and the nursing notes.  Pertinent lab results that were available during my care of the patient were reviewed by me and considered in my medical decision making (see chart for details).  Patient presenting after collapsing at home,  markedly hypotensive and in route.  On arrival, patient was minimally responsive with peripheral pulses not easily palpable.  She had a drainage tube from her abdomen which was probably a G-tube.  She was noted to be febrile, and code sepsis was initiated including aggressive fluid resuscitation and labs and antibiotics were ordered.  Old records were reviewed and it is noted that she was recently diagnosed with metastatic colon cancer and had received one course of chemotherapy.  Oncology note had suggested that hospice would be appropriate.  Oncology follow-up was scheduled for tomorrow.  Family arrived shortly after resuscitation was initiated and severity of the condition was explained to them-both husband and son.  She was full code, but when I explained how sick she was, the decided  to talk among themselves whether that is truly what they would want.  In spite of resuscitation, the patient became progressively bradycardic and respirations became agonal.  CPR was initiated while I spoke to the son and father again.  Decision was made to make the patient DNR.  CPR was stopped and patient pronounced dead at 4:35 AM.  This is clearly an expected medical death, so not a medical examiner's case.  I have discussed the case with Clyde Lundborg PA on call for patient's primary care provider [facility] Matter, and she states that [facility] will file the death certificate.  Final Clinical Impressions(s) / ED Diagnoses   Final diagnoses:  Metastatic colon cancer in female [facility])  Fever, unspecified fever cause    ED Discharge Orders    None       Delora Fuel, MD [birth year] [phone]

## 2017-11-12 NOTE — ED Triage Notes (Signed)
Pt to ED by EMS from home for altered mental status. Recently admitted for colon cancer with attempted colon resection and ostomy placement. Family called EMS for unresponsiveness. On arrival, per EMS pt was alert and oriented, had another syncopal episode, began vomiting then became unresponsive. Absent radials on arrival, PICC noted to R upper arm with lipids infusing. IO to L tibia

## 2017-11-12 NOTE — Telephone Encounter (Signed)
[employer], dtr-in-law, called to say that patient passed away at [facility].   Appts. Need to be cancelled. Expressed my condolences and appreciation for calling.  Will let Dr. Benay Spice know.

## 2017-11-12 NOTE — ED Notes (Signed)
Unable to obtain manual bp or dopplered bp

## 2017-11-12 NOTE — ED Notes (Signed)
[facility] home [facility] pump taken home by family

## 2017-11-12 DEATH — deceased

## 2017-11-25 ENCOUNTER — Other Ambulatory Visit: Payer: Self-pay | Admitting: Nurse Practitioner

## 2018-11-24 IMAGING — US US RENAL
1 series · 13 of 25 positions shown · non-contrast
Comparison: CT abdomen and pelvis September 14, 2017

CLINICAL DATA: Acute renal injury.  History of ovarian carcinoma

EXAM:
RENAL ULTRASOUND

[Series 1: us renal · 0.26mm/px · 13 of 36 slices shown]
[im 1/36]
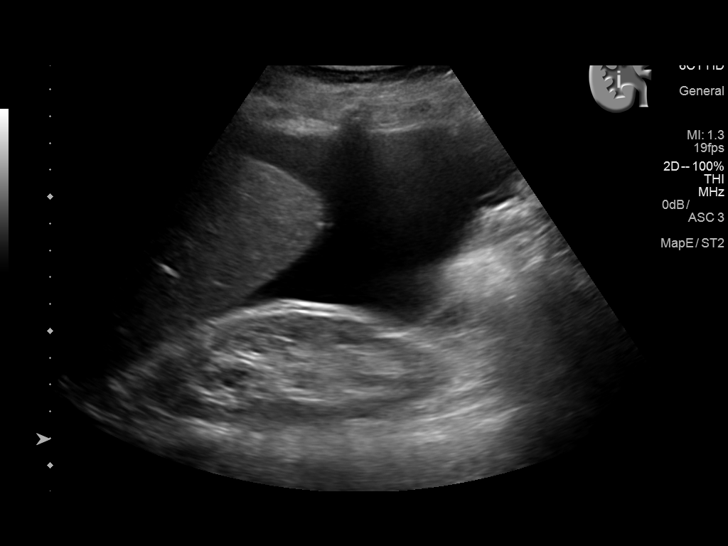
[im 3/36]
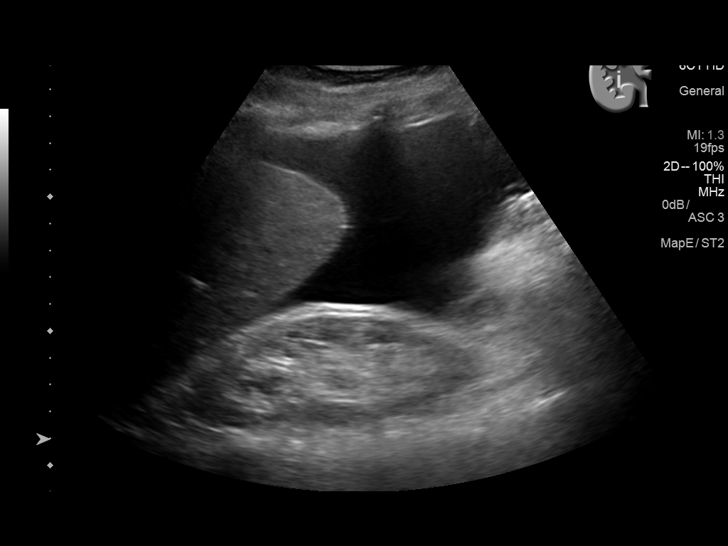
[im 6/36]
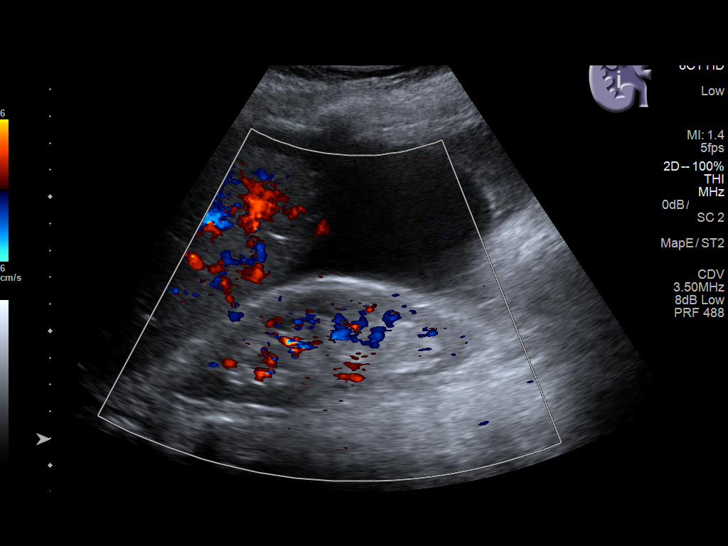
[im 9/36]
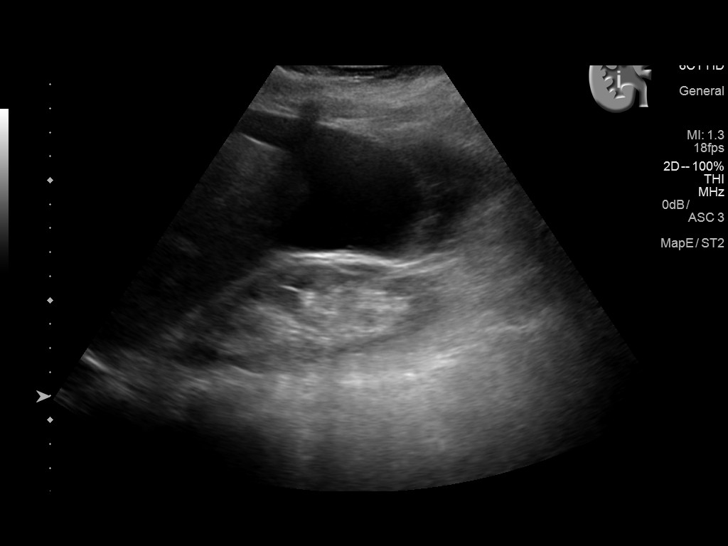
[im 12/36]
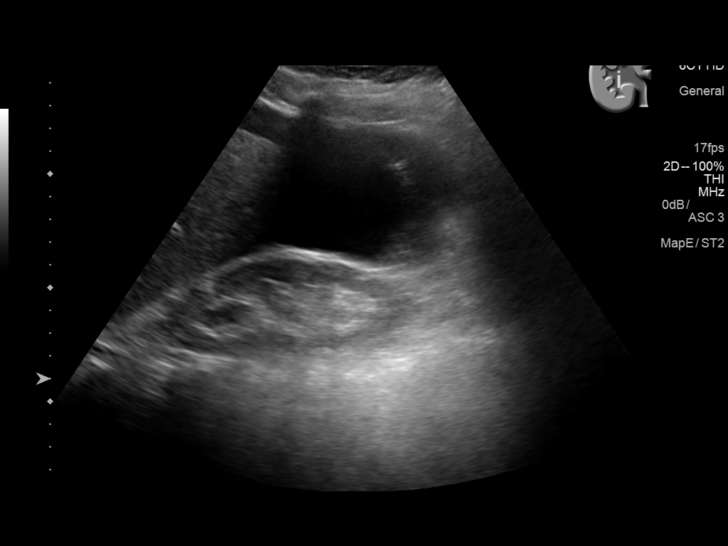
[im 15/36]
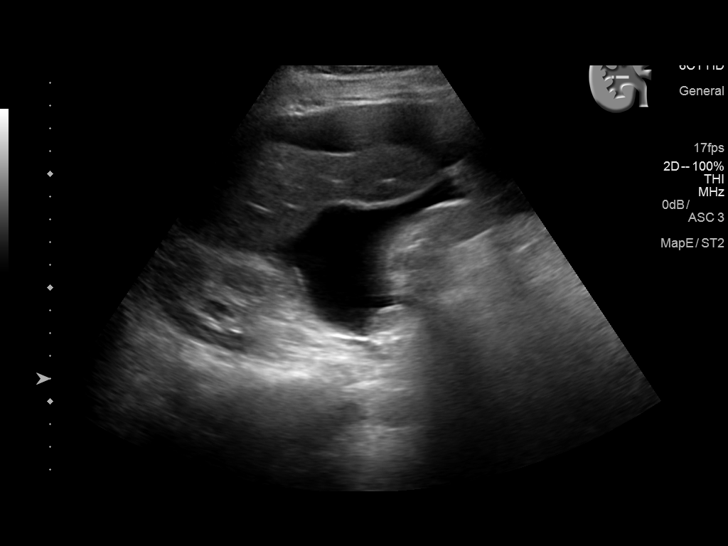
[im 18/36]
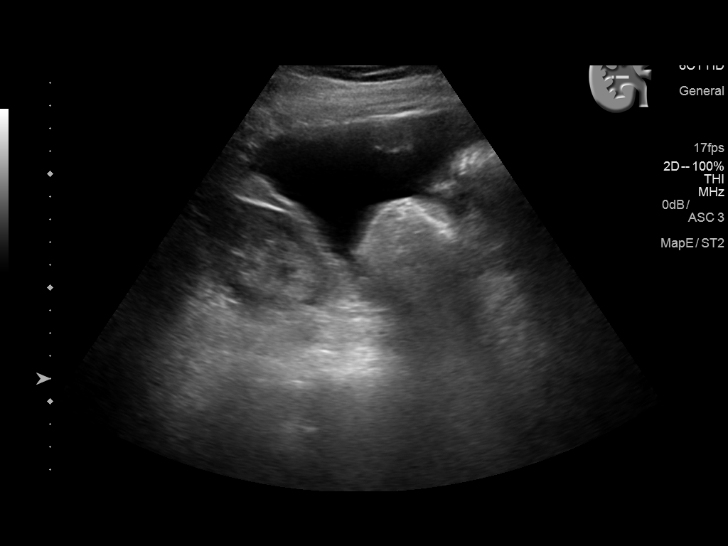
[im 21/36]
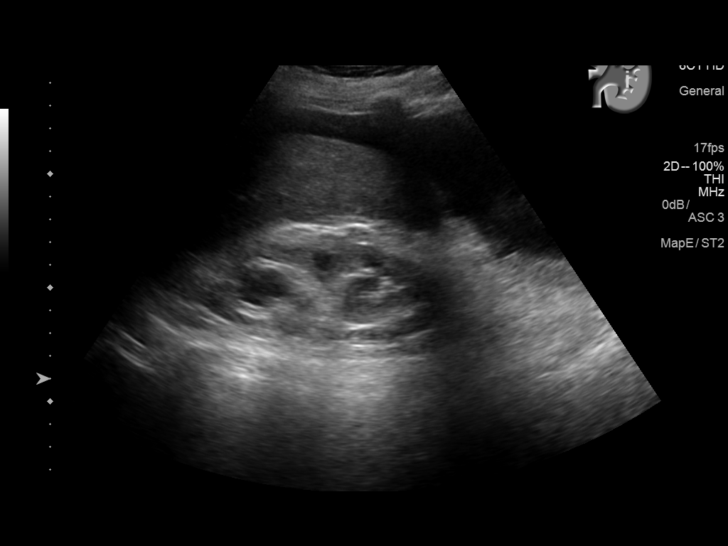
[im 24/36]
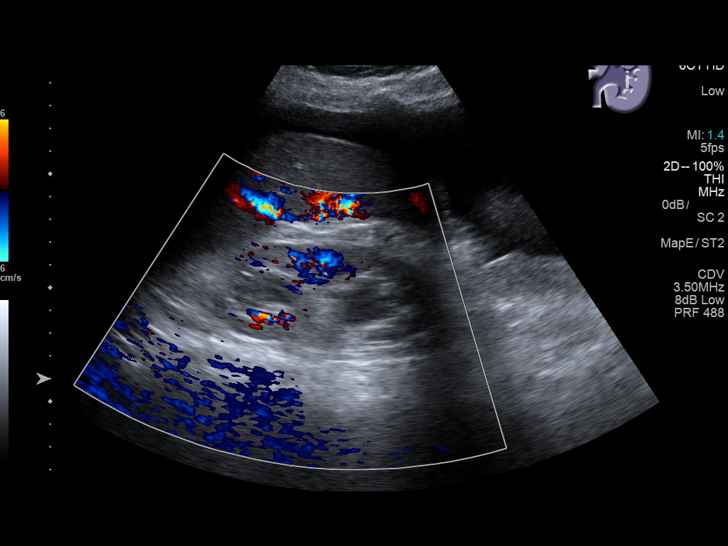
[im 27/36]
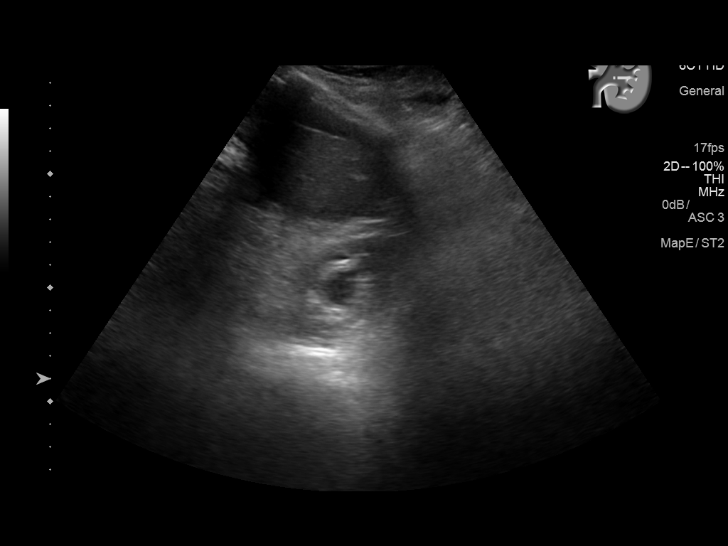
[im 30/36]
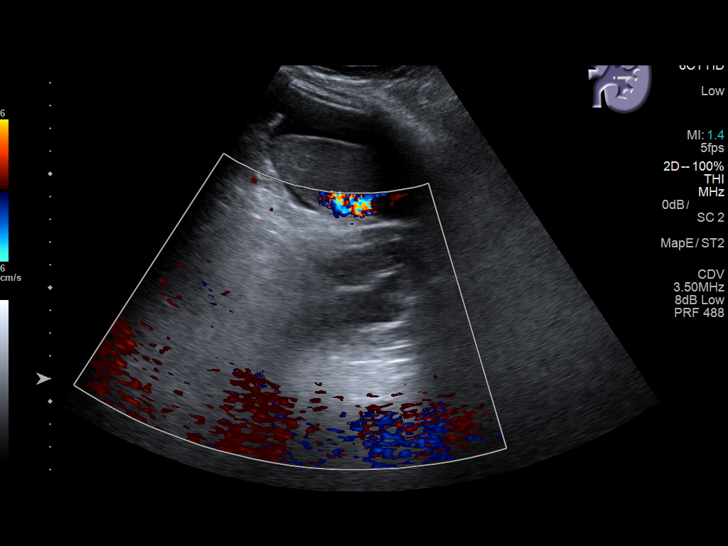
[im 33/36]
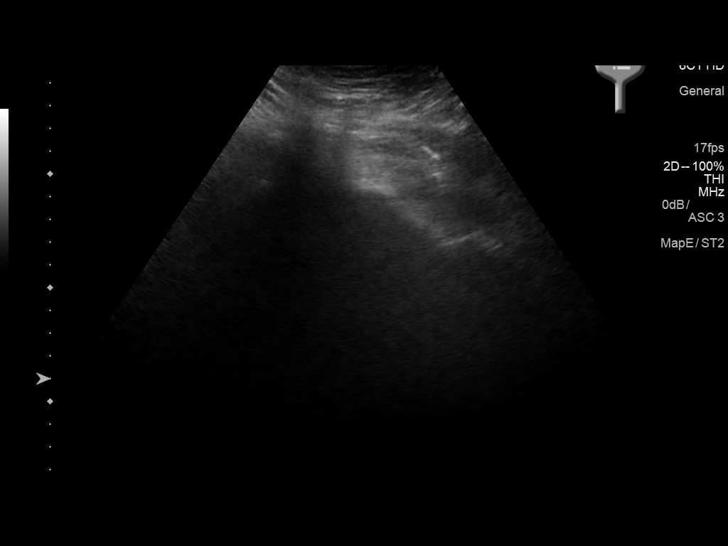
[im 36/36]
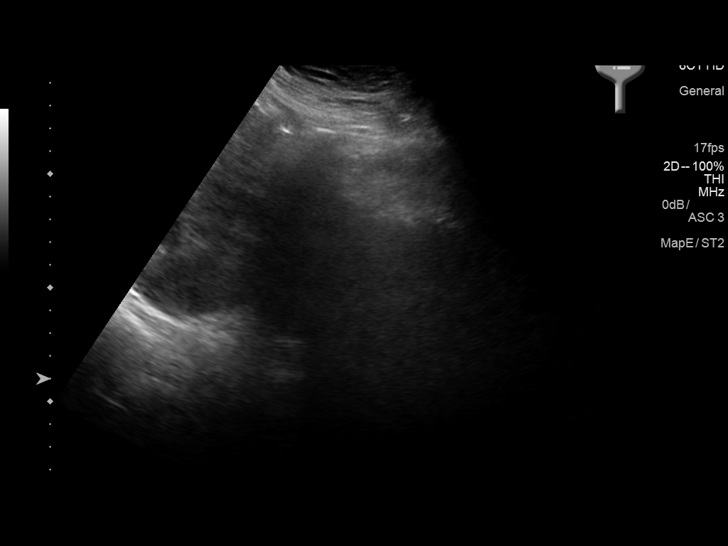

[13 of 25 positions shown; findings below may reference images not displayed]

FINDINGS: Right Kidney:

Length: 11.6 cm. Echogenicity is mildly increased. There is renal
cortical thinning. No mass or perinephric fluid. Slight
hydronephrosis visualized. There is a small extrarenal pelvis, seen
on CT. No sonographically demonstrable calculus or ureterectasis.

Left Kidney:

Length: 11.8 cm. Echogenicity is mildly increased. There is renal
cortical thinning. No mass or perinephric fluid visualized. There is
moderate hydronephrosis on the left. No renal calculus or
ureterectasis on the left.

Bladder:

Postvoid with bladder nonvisualized.

Other: Moderate ascites present. There is a right adrenal mass
measuring 3.0 x 1.8 x 2.6 cm, also seen on recent CT.
IMPRESSION: 1. Each kidney shows increased echogenicity and cortical thinning,
findings indicative of medical renal disease.

2. Moderate hydronephrosis on the left. Slight hydronephrosis on the
right. Obstructing focus on each side not seen.

3.  Urinary bladder decompressed and not visualized.

4.  Right adrenal mass, unchanged from recent CT examination.

5.  Moderate ascites.

## 2019-09-22 IMAGING — DX DG ABDOMEN 1V
1 series · 1 of 1 positions shown · non-contrast
Comparison: Radiographs and CT 09/14/2017.

CLINICAL DATA: Nasogastric tube placement.  Pelvic malignancy.

EXAM:
ABDOMEN - 1 VIEW

[abdomen kub]
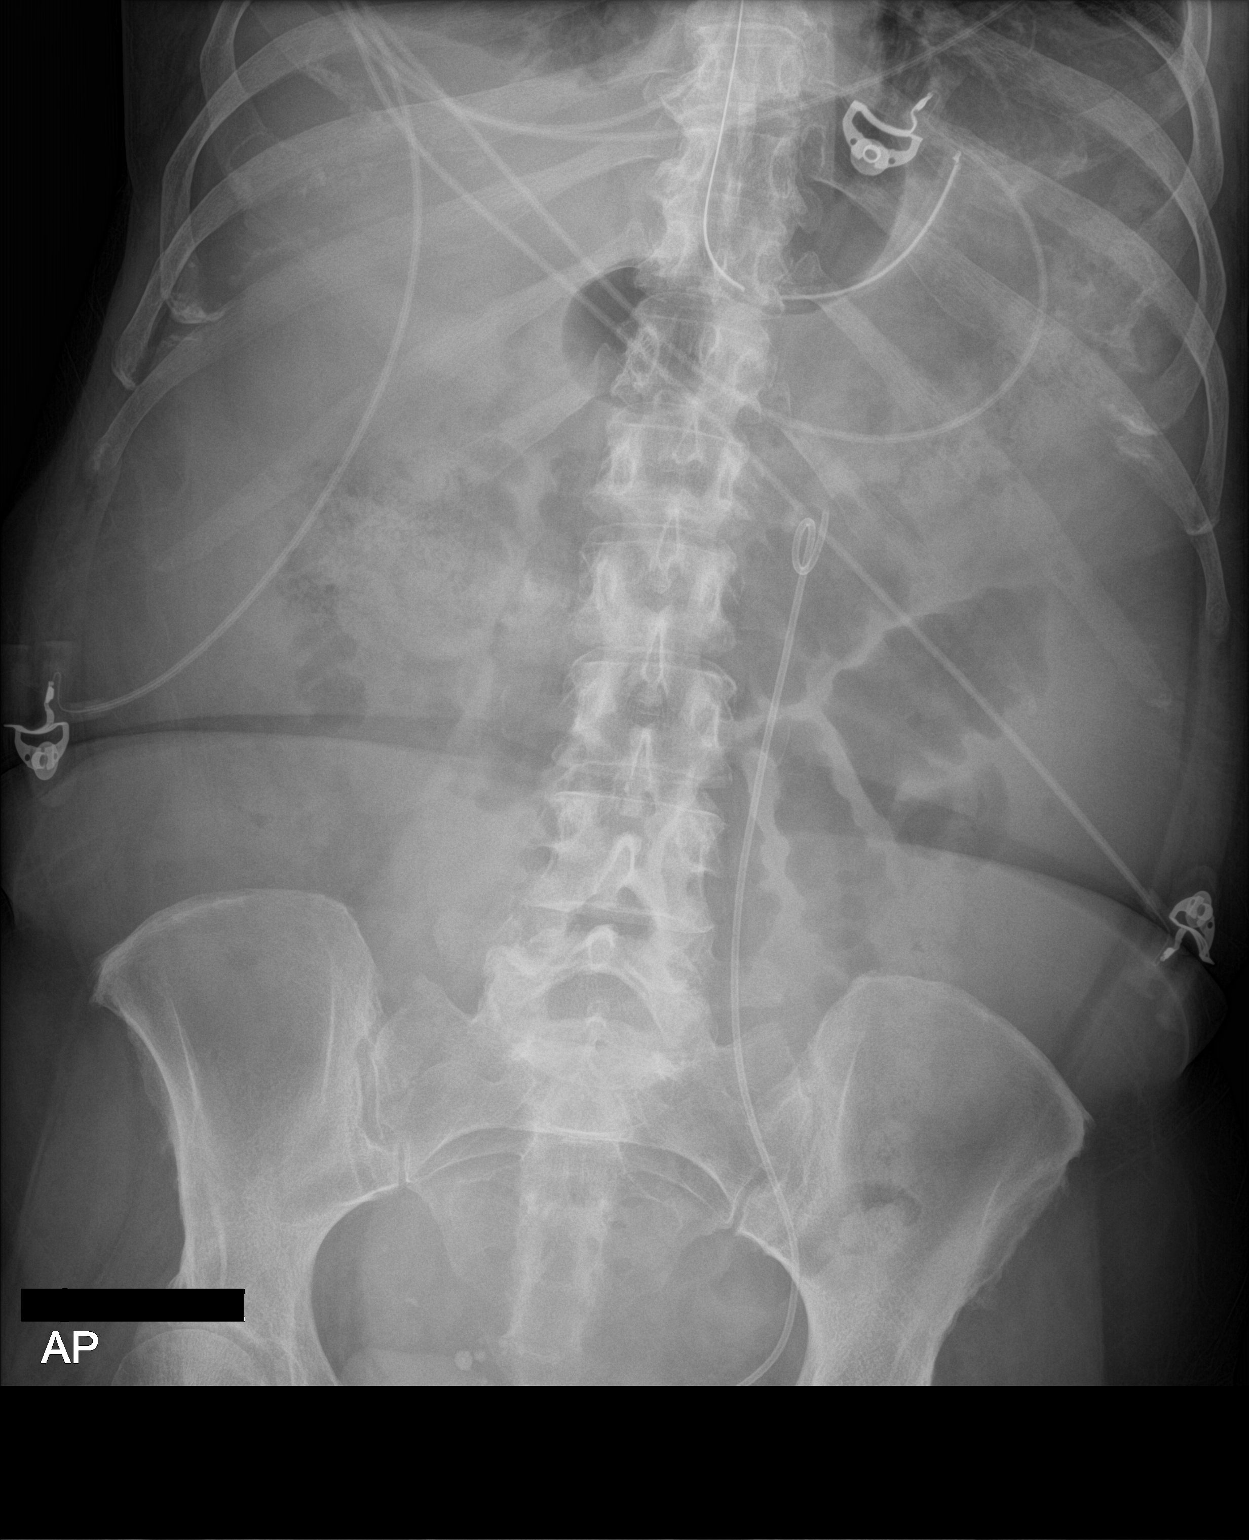

[1 of 1 positions shown; findings below may reference images not displayed]

FINDINGS: 0914 hours. Enteric tube projects below the diaphragm with tip in
the left subphrenic region, likely in the gastric fundus. There is a
double-J left ureteral stent. Mild bowel wall thickening is present
in the left mid abdomen. No supine evidence of free intraperitoneal
air. Right pelvic calcifications consistent with phleboliths are
stable.
IMPRESSION: Enteric tube projects over the left upper quadrant of the abdomen,
likely in the gastric fundus.

## 2019-09-24 IMAGING — CT CT BIOPSY
3 series · 13 of 32 positions shown, 17 images · non-contrast
Comparison: CT of the abdomen and pelvis on 09/14/2017 and
09/03/2017

CLINICAL DATA: Pelvic mass, diffuse peritoneal carcinomatosis and
ascites. Prior cytology of peritoneal fluid was not conclusive for
gynecologic malignancy and suggested potential GI origin. Request
has now been made to perform biopsy of peritoneal tumor for more
definitive diagnosis.

EXAM:
CT GUIDED CORE BIOPSY OF PERITONEAL MASS

[Series 2: i-spiral 5.0 b31f · axial · 0.68mm/px · z∈[+1132,+1420]mm · 8 of 102 slices shown (1 of 2)]
[im 10/102  soft-tissue]
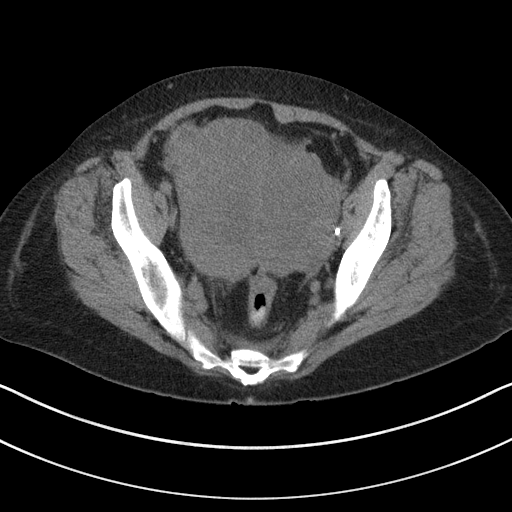
[im 19/102  soft-tissue]
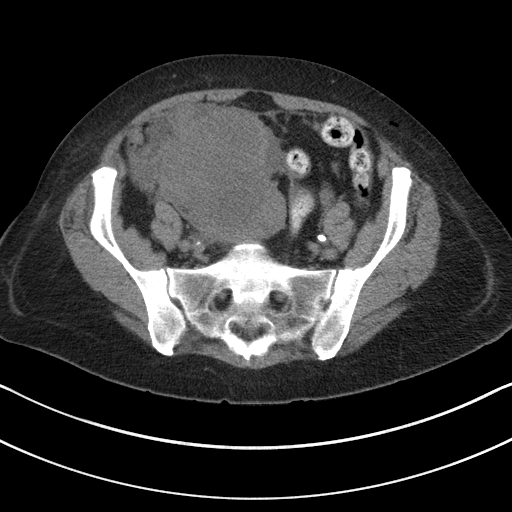
[im 33/102  soft-tissue]
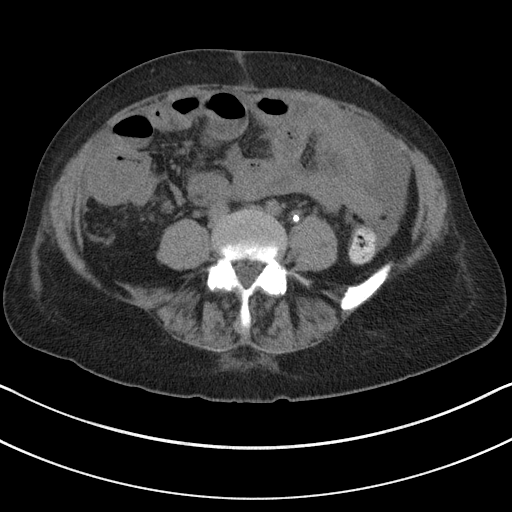
[im 46/102  soft-tissue]
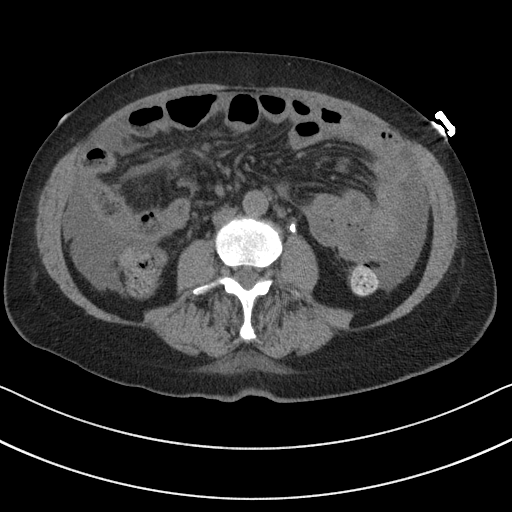
[im 56/102  soft-tissue]
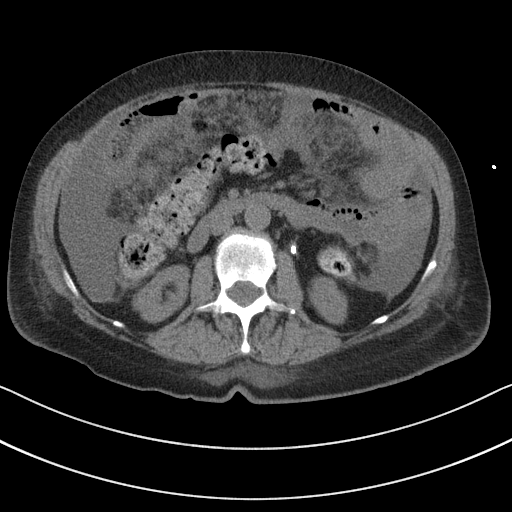
[im 69/102  soft-tissue]
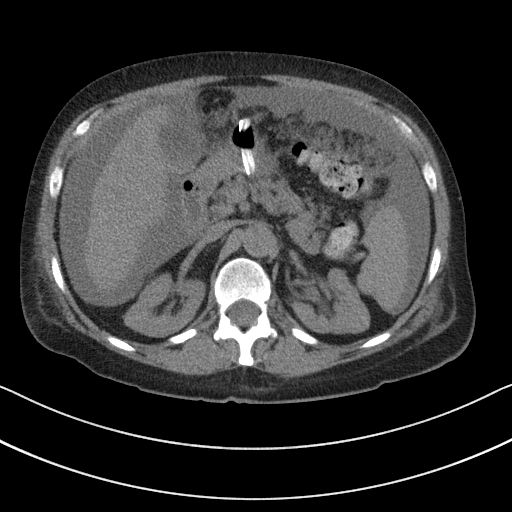
[im 83/102  soft-tissue]
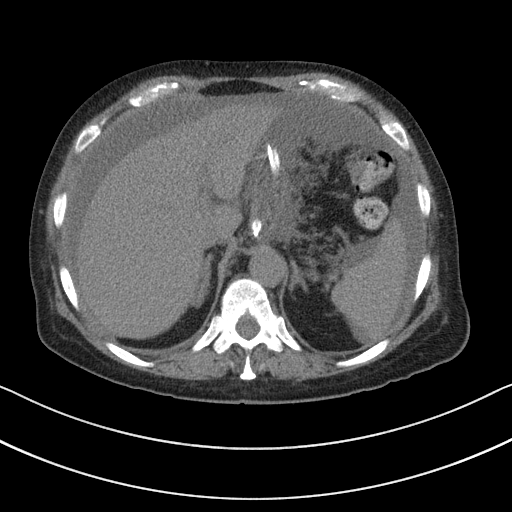
[im 92/102  soft-tissue]
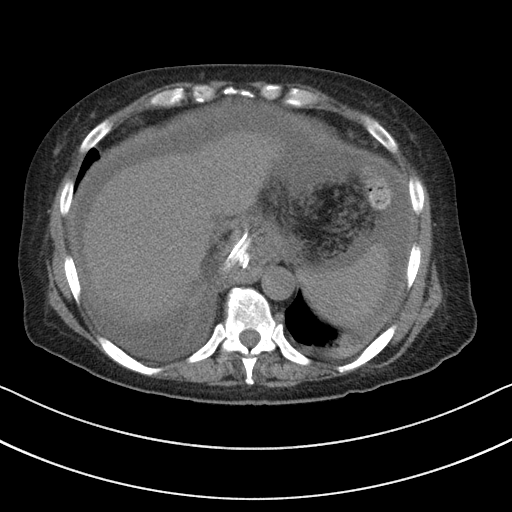

[Series 3: i-sequence 4.8 b31s · axial · 0.68mm/px · z∈[+1306,+1314]mm · 3 of 24 slices shown, 7 images]
[im 6/24  soft-tissue]
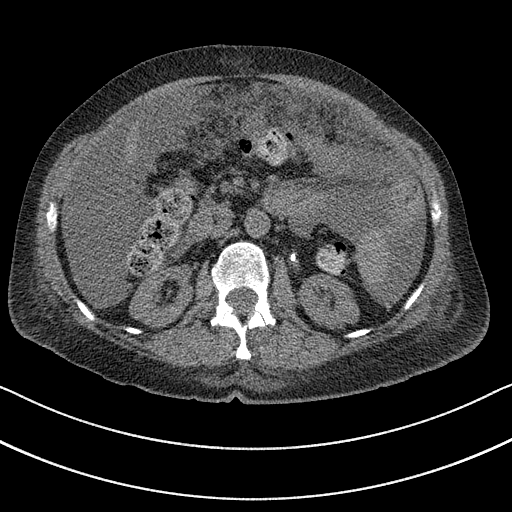
[im 6/24  lung]
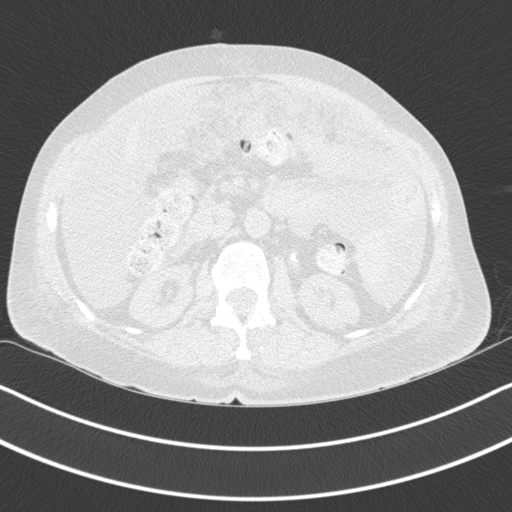
[im 6/24  bone]
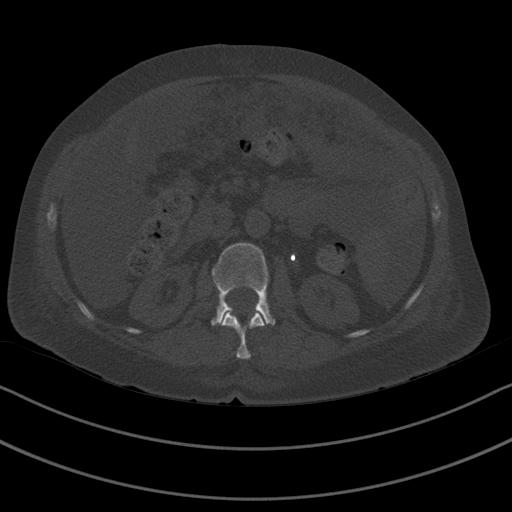
[im 12/24  soft-tissue]
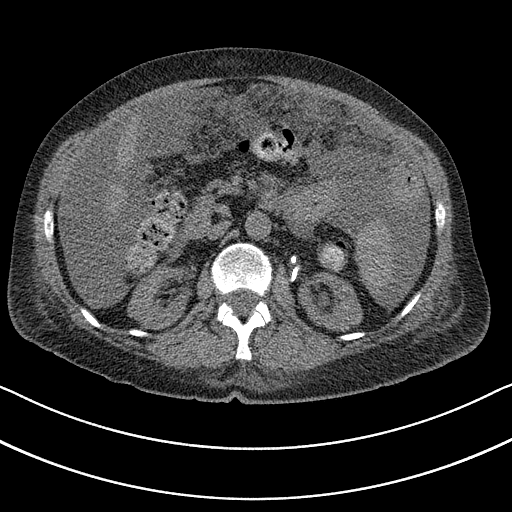
[im 12/24  lung]
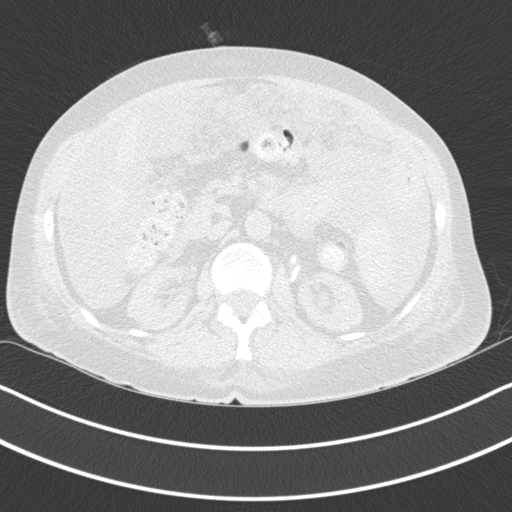
[im 18/24  soft-tissue]
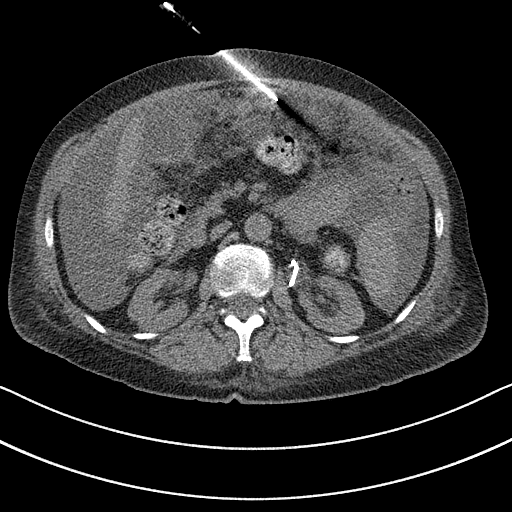
[im 18/24  lung]
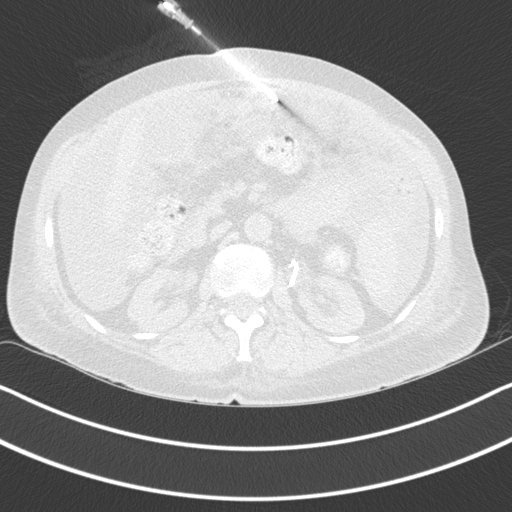

[Series 4: i-spiral 5.0 b31f · axial · 0.68mm/px · z∈[+1299,+1320]mm · 2 of 20 slices shown (2 of 2)]
[im 7/20  soft-tissue]
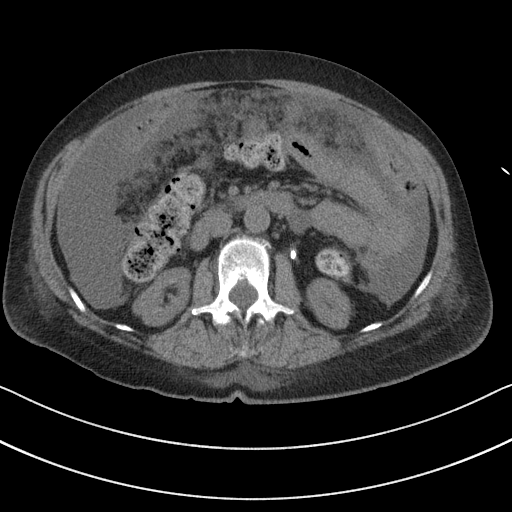
[im 13/20  soft-tissue]
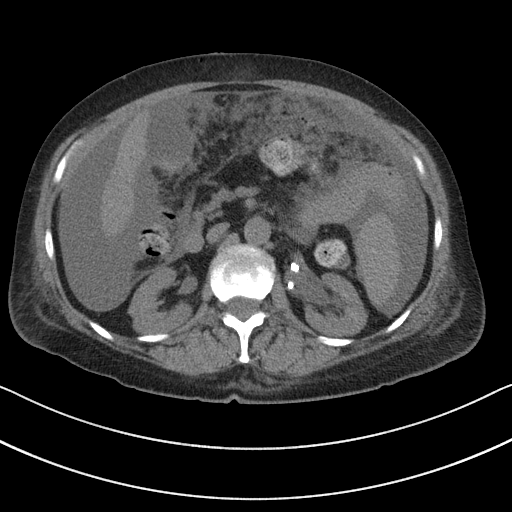

[13 of 32 positions shown; findings below may reference images not displayed]

ANESTHESIA/SEDATION:
2.0 mg IV Versed; 100 mcg IV Fentanyl

Total Moderate Sedation Time:  20 minutes.

The patient's level of consciousness and physiologic status were
continuously monitored during the procedure by Radiology nursing.

PROCEDURE:
The procedure risks, benefits, and alternatives were explained to
the patient. Questions regarding the procedure were encouraged and
answered. The patient understands and consents to the procedure.

The abdominal wall was prepped with chlorhexidine in a sterile
fashion, and a sterile drape was applied covering the operative
field. A sterile gown and sterile gloves were used for the
procedure. Local anesthesia was provided with 1% Lidocaine.

CT was performed in a supine position. Under CT guidance, a 17 gauge
trocar needle was advanced from an anterior oblique position into
the left anterior peritoneal cavity. Core biopsy was performed with
an 18 gauge core biopsy needle device. A total of 4 samples were
obtained and submitted in formalin. Additional CT images were
obtained after outer needle removal.

COMPLICATIONS:
None
FINDINGS: Small bowel shows decompression since prior CT on 09/14/2017 after
nasogastric decompression. This now allows for a safe window to
approach of peritoneal tumor in the anterior peritoneal
cavity/omental region. Tumor just deep to the left anterior
abdominal wall was targeted and revealed solid tissue with biopsy.
IMPRESSION: CT-guided core biopsy performed of peritoneal tumor.
